# Patient Record
Sex: Female | Born: 1940 | Race: White | Hispanic: No | Marital: Single | Smoking: Never smoker
Health system: Southern US, Community
[De-identification: ages and names within clinical notes are randomized; demographics above are authoritative.]

## PROBLEM LIST (undated history)

## (undated) DIAGNOSIS — I10 Essential (primary) hypertension: Secondary | ICD-10-CM

## (undated) DIAGNOSIS — R519 Headache, unspecified: Secondary | ICD-10-CM

## (undated) DIAGNOSIS — D751 Secondary polycythemia: Secondary | ICD-10-CM

## (undated) DIAGNOSIS — T7840XA Allergy, unspecified, initial encounter: Secondary | ICD-10-CM

## (undated) DIAGNOSIS — I4891 Unspecified atrial fibrillation: Secondary | ICD-10-CM

## (undated) DIAGNOSIS — K219 Gastro-esophageal reflux disease without esophagitis: Secondary | ICD-10-CM

## (undated) DIAGNOSIS — G4730 Sleep apnea, unspecified: Secondary | ICD-10-CM

## (undated) DIAGNOSIS — I422 Other hypertrophic cardiomyopathy: Secondary | ICD-10-CM

## (undated) DIAGNOSIS — I2699 Other pulmonary embolism without acute cor pulmonale: Secondary | ICD-10-CM

## (undated) DIAGNOSIS — R001 Bradycardia, unspecified: Secondary | ICD-10-CM

## (undated) DIAGNOSIS — E119 Type 2 diabetes mellitus without complications: Secondary | ICD-10-CM

## (undated) HISTORY — DX: Unspecified atrial fibrillation: I48.91

## (undated) HISTORY — PX: APPENDECTOMY: SHX54

## (undated) HISTORY — DX: Allergy, unspecified, initial encounter: T78.40XA

## (undated) HISTORY — DX: Secondary polycythemia: D75.1

## (undated) HISTORY — PX: TONSILLECTOMY: SUR1361

## (undated) HISTORY — DX: Other hypertrophic cardiomyopathy: I42.2

## (undated) HISTORY — DX: Bradycardia, unspecified: R00.1

---
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
## 2004-06-28 ENCOUNTER — Ambulatory Visit: Payer: Self-pay | Admitting: Internal Medicine

## 2005-10-27 ENCOUNTER — Ambulatory Visit: Payer: Self-pay | Admitting: Internal Medicine

## 2006-12-05 ENCOUNTER — Ambulatory Visit: Payer: Self-pay | Admitting: Internal Medicine

## 2007-02-20 ENCOUNTER — Ambulatory Visit: Payer: Self-pay | Admitting: Internal Medicine

## 2008-05-07 ENCOUNTER — Ambulatory Visit: Payer: Self-pay | Admitting: Internal Medicine

## 2009-05-19 ENCOUNTER — Ambulatory Visit: Payer: Self-pay | Admitting: Internal Medicine

## 2009-05-26 ENCOUNTER — Ambulatory Visit: Payer: Self-pay | Admitting: Internal Medicine

## 2009-07-15 ENCOUNTER — Ambulatory Visit: Payer: Self-pay | Admitting: Gastroenterology

## 2010-02-28 ENCOUNTER — Ambulatory Visit: Payer: Self-pay | Admitting: Internal Medicine

## 2010-05-24 ENCOUNTER — Ambulatory Visit: Payer: Self-pay | Admitting: Internal Medicine

## 2011-07-26 ENCOUNTER — Ambulatory Visit: Payer: Self-pay | Admitting: Internal Medicine

## 2011-08-15 DIAGNOSIS — I2699 Other pulmonary embolism without acute cor pulmonale: Secondary | ICD-10-CM

## 2011-08-15 HISTORY — DX: Other pulmonary embolism without acute cor pulmonale: I26.99

## 2012-05-02 ENCOUNTER — Ambulatory Visit: Payer: Self-pay | Admitting: Internal Medicine

## 2012-05-04 ENCOUNTER — Inpatient Hospital Stay: Payer: Self-pay

## 2012-05-04 LAB — COMPREHENSIVE METABOLIC PANEL
Alkaline Phosphatase: 81 U/L (ref 50–136)
BUN: 17 mg/dL (ref 7–18)
Calcium, Total: 9.3 mg/dL (ref 8.5–10.1)
Chloride: 103 mmol/L (ref 98–107)
Co2: 24 mmol/L (ref 21–32)
EGFR (African American): 53 — ABNORMAL LOW
EGFR (Non-African Amer.): 45 — ABNORMAL LOW
Potassium: 3.6 mmol/L (ref 3.5–5.1)
SGOT(AST): 35 U/L (ref 15–37)
SGPT (ALT): 49 U/L (ref 12–78)
Total Protein: 7.5 g/dL (ref 6.4–8.2)

## 2012-05-04 LAB — CBC
HCT: 50.6 % — ABNORMAL HIGH (ref 35.0–47.0)
HGB: 16.9 g/dL — ABNORMAL HIGH (ref 12.0–16.0)
MCH: 29.4 pg (ref 26.0–34.0)
MCHC: 33.5 g/dL (ref 32.0–36.0)
Platelet: 183 10*3/uL (ref 150–440)
RBC: 5.76 10*6/uL — ABNORMAL HIGH (ref 3.80–5.20)

## 2012-05-04 LAB — PROTIME-INR: INR: 1

## 2012-05-04 LAB — CK TOTAL AND CKMB (NOT AT ARMC): CK-MB: 3.3 ng/mL (ref 0.5–3.6)

## 2012-05-04 LAB — TROPONIN I: Troponin-I: 0.06 ng/mL — ABNORMAL HIGH

## 2012-05-04 LAB — APTT
Activated PTT: 31.5 secs (ref 23.6–35.9)
Activated PTT: >160 secs (ref 23.6–35.9)

## 2012-05-04 LAB — PRO B NATRIURETIC PEPTIDE: B-Type Natriuretic Peptide: 7182 pg/mL — ABNORMAL HIGH (ref 0–125)

## 2012-05-05 LAB — APTT: Activated PTT: 92.3 secs — ABNORMAL HIGH (ref 23.6–35.9)

## 2012-05-05 LAB — CBC WITH DIFFERENTIAL/PLATELET
Basophil #: 0.1 10*3/uL (ref 0.0–0.1)
Eosinophil #: 0.5 10*3/uL (ref 0.0–0.7)
HCT: 44.5 % (ref 35.0–47.0)
Lymphocyte #: 1.7 10*3/uL (ref 1.0–3.6)
MCHC: 33.6 g/dL (ref 32.0–36.0)
MCV: 88 fL (ref 80–100)
Monocyte #: 0.7 x10 3/mm (ref 0.2–0.9)
Monocyte %: 9.8 %
Neutrophil #: 3.8 10*3/uL (ref 1.4–6.5)
Neutrophil %: 56.2 %
Platelet: 157 10*3/uL (ref 150–440)
RDW: 14 % (ref 11.5–14.5)
WBC: 6.8 10*3/uL (ref 3.6–11.0)

## 2012-05-05 LAB — BASIC METABOLIC PANEL
BUN: 15 mg/dL (ref 7–18)
Creatinine: 1.14 mg/dL (ref 0.60–1.30)
EGFR (African American): 56 — ABNORMAL LOW
EGFR (Non-African Amer.): 49 — ABNORMAL LOW
Glucose: 124 mg/dL — ABNORMAL HIGH (ref 65–99)
Potassium: 3.8 mmol/L (ref 3.5–5.1)

## 2012-05-05 LAB — TROPONIN I: Troponin-I: 0.22 ng/mL — ABNORMAL HIGH

## 2012-05-05 LAB — HEMOGLOBIN A1C: Hemoglobin A1C: 6.8 % — ABNORMAL HIGH (ref 4.2–6.3)

## 2012-05-06 LAB — CBC WITH DIFFERENTIAL/PLATELET
Basophil #: 0.1 10*3/uL (ref 0.0–0.1)
Eosinophil #: 0.6 10*3/uL (ref 0.0–0.7)
HCT: 42.7 % (ref 35.0–47.0)
Lymphocyte %: 27.7 %
MCHC: 33.8 g/dL (ref 32.0–36.0)
Monocyte #: 0.7 x10 3/mm (ref 0.2–0.9)
Monocyte %: 9.9 %
Neutrophil #: 3.9 10*3/uL (ref 1.4–6.5)
Neutrophil %: 53.9 %
Platelet: 147 10*3/uL — ABNORMAL LOW (ref 150–440)
RDW: 14.1 % (ref 11.5–14.5)
WBC: 7.3 10*3/uL (ref 3.6–11.0)

## 2012-05-06 LAB — BASIC METABOLIC PANEL
Anion Gap: 8 (ref 7–16)
Co2: 25 mmol/L (ref 21–32)
EGFR (African American): >60
Glucose: 122 mg/dL — ABNORMAL HIGH (ref 65–99)
Potassium: 4.3 mmol/L (ref 3.5–5.1)
Sodium: 144 mmol/L (ref 136–145)

## 2012-05-06 LAB — APTT: Activated PTT: 99.9 secs — ABNORMAL HIGH (ref 23.6–35.9)

## 2012-05-07 ENCOUNTER — Ambulatory Visit: Payer: Self-pay | Admitting: Internal Medicine

## 2012-05-07 LAB — PROTIME-INR
INR: 1
Prothrombin Time: 13.3 secs (ref 11.5–14.7)

## 2012-05-07 LAB — APTT: Activated PTT: 77.7 secs — ABNORMAL HIGH (ref 23.6–35.9)

## 2012-05-08 LAB — PLATELET COUNT: Platelet: 138 10*3/uL — ABNORMAL LOW (ref 150–440)

## 2012-05-14 ENCOUNTER — Ambulatory Visit: Payer: Self-pay | Admitting: Internal Medicine

## 2012-05-17 ENCOUNTER — Ambulatory Visit: Payer: Self-pay | Admitting: Internal Medicine

## 2012-05-17 LAB — CBC CANCER CENTER
Basophil #: 0.1 x10 3/mm (ref 0.0–0.1)
Basophil %: 0.8 %
Eosinophil #: 0.4 x10 3/mm (ref 0.0–0.7)
HCT: 51.1 % — ABNORMAL HIGH (ref 35.0–47.0)
HGB: 16.1 g/dL — ABNORMAL HIGH (ref 12.0–16.0)
Lymphocyte #: 1.3 x10 3/mm (ref 1.0–3.6)
MCHC: 31.6 g/dL — ABNORMAL LOW (ref 32.0–36.0)
MCV: 89 fL (ref 80–100)
Monocyte %: 9.9 %
Neutrophil #: 4.6 x10 3/mm (ref 1.4–6.5)
RBC: 5.72 10*6/uL — ABNORMAL HIGH (ref 3.80–5.20)
RDW: 14.2 % (ref 11.5–14.5)
WBC: 7.1 x10 3/mm (ref 3.6–11.0)

## 2012-05-17 LAB — PROTIME-INR
INR: 3.7
Prothrombin Time: 36.7 secs — ABNORMAL HIGH (ref 11.5–14.7)

## 2012-05-17 LAB — HEPATIC FUNCTION PANEL A (ARMC)
Alkaline Phosphatase: 88 U/L (ref 50–136)
Bilirubin,Total: 0.4 mg/dL (ref 0.2–1.0)
SGOT(AST): 27 U/L (ref 15–37)
SGPT (ALT): 66 U/L (ref 12–78)

## 2012-05-17 LAB — CREATININE, SERUM: EGFR (Non-African Amer.): 50 — ABNORMAL LOW

## 2012-05-18 LAB — CEA: CEA: 4.9 ng/mL — ABNORMAL HIGH (ref 0.0–4.7)

## 2012-05-20 LAB — PROT IMMUNOELECTROPHORES(ARMC)

## 2012-05-20 LAB — CANCER CENTER HEMATOCRIT: HCT: 45.7 % (ref 35.0–47.0)

## 2012-05-20 LAB — PROTIME-INR: INR: 2.6

## 2012-06-03 LAB — CANCER CENTER HEMATOCRIT: HCT: 42.8 % (ref 35.0–47.0)

## 2012-06-10 LAB — HEMATOCRIT: HCT: 43.1 % (ref 35.0–47.0)

## 2012-06-14 ENCOUNTER — Ambulatory Visit: Payer: Self-pay | Admitting: Internal Medicine

## 2012-06-17 LAB — CBC CANCER CENTER
Basophil %: 0.8 %
Eosinophil #: 0.3 x10 3/mm (ref 0.0–0.7)
Eosinophil %: 5.5 %
HCT: 41.6 % (ref 35.0–47.0)
HGB: 13.4 g/dL (ref 12.0–16.0)
Lymphocyte #: 1.6 x10 3/mm (ref 1.0–3.6)
Lymphocyte %: 30.9 %
MCV: 89 fL (ref 80–100)
Monocyte #: 0.6 x10 3/mm (ref 0.2–0.9)
Neutrophil #: 2.5 x10 3/mm (ref 1.4–6.5)
Neutrophil %: 50.2 %
RDW: 14.3 % (ref 11.5–14.5)
WBC: 5.1 x10 3/mm (ref 3.6–11.0)

## 2012-06-24 LAB — CBC CANCER CENTER
Basophil %: 1 %
Eosinophil %: 5.9 %
HCT: 44.1 % (ref 35.0–47.0)
HGB: 14 g/dL (ref 12.0–16.0)
Lymphocyte #: 1.3 x10 3/mm (ref 1.0–3.6)
Monocyte #: 0.4 x10 3/mm (ref 0.2–0.9)
Monocyte %: 9.9 %
Neutrophil #: 1.7 x10 3/mm (ref 1.4–6.5)
RDW: 14 % (ref 11.5–14.5)
WBC: 3.6 x10 3/mm (ref 3.6–11.0)

## 2012-06-24 LAB — CREATININE, SERUM
EGFR (African American): 58 — ABNORMAL LOW
EGFR (Non-African Amer.): 50 — ABNORMAL LOW

## 2012-07-09 LAB — CANCER CENTER HEMATOCRIT: HCT: 42.5 % (ref 35.0–47.0)

## 2012-07-14 ENCOUNTER — Ambulatory Visit: Payer: Self-pay | Admitting: Internal Medicine

## 2012-07-30 LAB — CANCER CENTER HEMATOCRIT: HCT: 41.4 % (ref 35.0–47.0)

## 2012-08-14 ENCOUNTER — Ambulatory Visit: Payer: Self-pay | Admitting: Internal Medicine

## 2012-08-21 LAB — CEA: CEA: 4.9 ng/mL — ABNORMAL HIGH (ref 0.0–4.7)

## 2012-09-14 ENCOUNTER — Ambulatory Visit: Payer: Self-pay | Admitting: Internal Medicine

## 2012-10-01 LAB — CBC CANCER CENTER
Basophil %: 1 %
Lymphocyte #: 1.8 x10 3/mm (ref 1.0–3.6)
Lymphocyte %: 25.8 %
MCH: 27.6 pg (ref 26.0–34.0)
MCHC: 32.7 g/dL (ref 32.0–36.0)
Monocyte %: 11.6 %
Neutrophil #: 3.9 x10 3/mm (ref 1.4–6.5)
Platelet: 233 x10 3/mm (ref 150–440)
RBC: 5.35 10*6/uL — ABNORMAL HIGH (ref 3.80–5.20)
RDW: 14.2 % (ref 11.5–14.5)

## 2012-10-02 ENCOUNTER — Ambulatory Visit: Payer: Self-pay

## 2012-10-12 ENCOUNTER — Ambulatory Visit: Payer: Self-pay | Admitting: Internal Medicine

## 2012-11-12 ENCOUNTER — Ambulatory Visit: Payer: Self-pay | Admitting: Internal Medicine

## 2012-11-12 LAB — CANCER CENTER HEMATOCRIT: HCT: 44.1 % (ref 35.0–47.0)

## 2012-12-12 ENCOUNTER — Ambulatory Visit: Payer: Self-pay | Admitting: Internal Medicine

## 2013-01-12 ENCOUNTER — Ambulatory Visit: Payer: Self-pay | Admitting: Internal Medicine

## 2013-02-11 ENCOUNTER — Ambulatory Visit: Payer: Self-pay | Admitting: Internal Medicine

## 2013-02-19 ENCOUNTER — Ambulatory Visit: Payer: Self-pay | Admitting: Gastroenterology

## 2013-02-20 LAB — PATHOLOGY REPORT

## 2013-03-04 LAB — CANCER CENTER HEMATOCRIT: HCT: 44.5 % (ref 35.0–47.0)

## 2013-03-14 ENCOUNTER — Ambulatory Visit: Payer: Self-pay | Admitting: Internal Medicine

## 2013-04-28 ENCOUNTER — Ambulatory Visit: Payer: Self-pay | Admitting: Internal Medicine

## 2013-05-14 ENCOUNTER — Ambulatory Visit: Payer: Self-pay | Admitting: Internal Medicine

## 2013-06-24 ENCOUNTER — Ambulatory Visit: Payer: Self-pay | Admitting: Internal Medicine

## 2013-06-24 LAB — CBC CANCER CENTER
Eosinophil %: 5.1 %
HGB: 13.6 g/dL (ref 12.0–16.0)
Lymphocyte #: 1.5 x10 3/mm (ref 1.0–3.6)
Lymphocyte %: 27.5 %
MCV: 85 fL (ref 80–100)
Monocyte #: 0.8 x10 3/mm (ref 0.2–0.9)
Monocyte %: 14.4 %
Neutrophil #: 2.7 x10 3/mm (ref 1.4–6.5)
Neutrophil %: 51.9 %
Platelet: 232 x10 3/mm (ref 150–440)
RDW: 14.1 % (ref 11.5–14.5)
WBC: 5.3 x10 3/mm (ref 3.6–11.0)

## 2013-06-24 LAB — HEPATIC FUNCTION PANEL A (ARMC)
Albumin: 3.2 g/dL — ABNORMAL LOW (ref 3.4–5.0)
Alkaline Phosphatase: 85 U/L (ref 50–136)
Bilirubin, Direct: 0.1 mg/dL (ref 0.00–0.20)
SGOT(AST): 19 U/L (ref 15–37)
SGPT (ALT): 24 U/L (ref 12–78)
Total Protein: 6.7 g/dL (ref 6.4–8.2)

## 2013-06-24 LAB — CREATININE, SERUM
Creatinine: 1.01 mg/dL (ref 0.60–1.30)
EGFR (African American): >60
EGFR (Non-African Amer.): 56 — ABNORMAL LOW

## 2013-07-14 ENCOUNTER — Ambulatory Visit: Payer: Self-pay | Admitting: Internal Medicine

## 2013-08-14 ENCOUNTER — Ambulatory Visit: Payer: Self-pay | Admitting: Internal Medicine

## 2013-09-15 ENCOUNTER — Ambulatory Visit: Payer: Self-pay | Admitting: Internal Medicine

## 2013-09-16 LAB — CANCER CENTER HEMATOCRIT: HCT: 44.6 % (ref 35.0–47.0)

## 2013-09-17 LAB — CEA: CEA: 5.6 ng/mL — AB (ref 0.0–4.7)

## 2013-10-12 ENCOUNTER — Ambulatory Visit: Payer: Self-pay | Admitting: Internal Medicine

## 2013-11-06 ENCOUNTER — Ambulatory Visit: Payer: Self-pay

## 2013-11-12 ENCOUNTER — Ambulatory Visit: Payer: Self-pay

## 2013-12-08 ENCOUNTER — Ambulatory Visit: Payer: Self-pay | Admitting: Internal Medicine

## 2013-12-09 LAB — CANCER CENTER HEMATOCRIT: HCT: 43.4 % (ref 35.0–47.0)

## 2013-12-12 ENCOUNTER — Ambulatory Visit: Payer: Self-pay | Admitting: Internal Medicine

## 2014-03-03 ENCOUNTER — Ambulatory Visit: Payer: Self-pay | Admitting: Internal Medicine

## 2014-03-03 LAB — CBC CANCER CENTER
BASOS ABS: 0.1 x10 3/mm (ref 0.0–0.1)
BASOS PCT: 0.9 %
Eosinophil #: 0.6 x10 3/mm (ref 0.0–0.7)
Eosinophil %: 8.9 %
HCT: 46.4 % (ref 35.0–47.0)
HGB: 14.9 g/dL (ref 12.0–16.0)
Lymphocyte #: 1.4 x10 3/mm (ref 1.0–3.6)
Lymphocyte %: 21.9 %
MCH: 27.8 pg (ref 26.0–34.0)
MCHC: 32.1 g/dL (ref 32.0–36.0)
MCV: 87 fL (ref 80–100)
MONO ABS: 0.6 x10 3/mm (ref 0.2–0.9)
MONOS PCT: 9.1 %
NEUTROS ABS: 3.7 x10 3/mm (ref 1.4–6.5)
NEUTROS PCT: 59.2 %
PLATELETS: 230 x10 3/mm (ref 150–440)
RBC: 5.36 10*6/uL — AB (ref 3.80–5.20)
RDW: 15.3 % — ABNORMAL HIGH (ref 11.5–14.5)
WBC: 6.2 x10 3/mm (ref 3.6–11.0)

## 2014-03-03 LAB — HEPATIC FUNCTION PANEL A (ARMC)
ALBUMIN: 3 g/dL — AB (ref 3.4–5.0)
ALK PHOS: 59 U/L
ALT: 23 U/L (ref 12–78)
BILIRUBIN DIRECT: 0.1 mg/dL (ref 0.00–0.20)
Bilirubin,Total: 0.5 mg/dL (ref 0.2–1.0)
SGOT(AST): 16 U/L (ref 15–37)
Total Protein: 6.5 g/dL (ref 6.4–8.2)

## 2014-03-03 LAB — CREATININE, SERUM
Creatinine: 0.98 mg/dL (ref 0.60–1.30)
EGFR (African American): >60
EGFR (Non-African Amer.): 58 — ABNORMAL LOW

## 2014-03-04 LAB — CEA: CEA: 6.5 ng/mL — AB (ref 0.0–4.7)

## 2014-03-14 ENCOUNTER — Ambulatory Visit: Payer: Self-pay | Admitting: Internal Medicine

## 2014-04-09 ENCOUNTER — Ambulatory Visit: Payer: Self-pay | Admitting: Gastroenterology

## 2014-05-15 ENCOUNTER — Ambulatory Visit: Payer: Self-pay

## 2014-05-26 ENCOUNTER — Ambulatory Visit: Payer: Self-pay | Admitting: Internal Medicine

## 2014-05-26 LAB — CANCER CENTER HEMATOCRIT: HCT: 42.1 % (ref 35.0–47.0)

## 2014-06-14 ENCOUNTER — Ambulatory Visit: Payer: Self-pay | Admitting: Internal Medicine

## 2014-08-18 ENCOUNTER — Ambulatory Visit: Payer: Self-pay | Admitting: Internal Medicine

## 2014-08-18 LAB — CANCER CENTER HEMATOCRIT: HCT: 39.9 % (ref 35.0–47.0)

## 2014-09-14 ENCOUNTER — Ambulatory Visit: Payer: Self-pay | Admitting: Internal Medicine

## 2014-11-10 ENCOUNTER — Ambulatory Visit
Admit: 2014-11-10 | Disposition: A | Discharge status: Home / Self Care | Payer: Self-pay | Attending: Internal Medicine | Admitting: Internal Medicine

## 2014-11-10 LAB — CBC CANCER CENTER
BASOS ABS: 0.1 x10 3/mm (ref 0.0–0.1)
Basophil %: 1.3 %
EOS PCT: 5.4 %
Eosinophil #: 0.3 x10 3/mm (ref 0.0–0.7)
HCT: 41 % (ref 35.0–47.0)
HGB: 13.4 g/dL (ref 12.0–16.0)
LYMPHS ABS: 1.6 x10 3/mm (ref 1.0–3.6)
Lymphocyte %: 31.2 %
MCH: 26.2 pg (ref 26.0–34.0)
MCHC: 32.6 g/dL (ref 32.0–36.0)
MCV: 80 fL (ref 80–100)
Monocyte #: 0.6 x10 3/mm (ref 0.2–0.9)
Monocyte %: 11.8 %
NEUTROS ABS: 2.7 x10 3/mm (ref 1.4–6.5)
Neutrophil %: 50.3 %
Platelet: 223 x10 3/mm (ref 150–440)
RBC: 5.11 10*6/uL (ref 3.80–5.20)
RDW: 15.2 % — ABNORMAL HIGH (ref 11.5–14.5)
WBC: 5.3 x10 3/mm (ref 3.6–11.0)

## 2014-11-10 LAB — HEPATIC FUNCTION PANEL A (ARMC)
Albumin: 3.7 g/dL
Alkaline Phosphatase: 61 U/L
BILIRUBIN TOTAL: 0.5 mg/dL
SGOT(AST): 17 U/L
SGPT (ALT): 16 U/L
TOTAL PROTEIN: 6.6 g/dL

## 2014-11-10 LAB — CREATININE, SERUM
Creatinine: 0.94 mg/dL
EGFR (African American): >60
EGFR (Non-African Amer.): >60

## 2014-11-11 LAB — CEA: CEA: 5.3 ng/mL — ABNORMAL HIGH (ref 0.0–4.7)

## 2014-11-13 ENCOUNTER — Ambulatory Visit
Admit: 2014-11-13 | Disposition: A | Discharge status: Home / Self Care | Payer: Self-pay | Attending: Internal Medicine | Admitting: Internal Medicine

## 2014-11-16 ENCOUNTER — Ambulatory Visit
Admit: 2014-11-16 | Disposition: A | Discharge status: Home / Self Care | Payer: Self-pay | Attending: Infectious Diseases | Admitting: Infectious Diseases

## 2014-12-01 NOTE — H&P (Signed)
PATIENT NAME:  Sarah Sweeney, Sarah Sweeney MR#:  [id number] DATE OF BIRTH:  Nov 20, 1940  DATE OF ADMISSION:  05/04/2012  [facility]  (Used to see Dr. Conchita Parison Chaplin, now assigned to Dr. Sampson GoonFitzgerald)  CHIEF COMPLAINT: Shortness of breath.   HISTORY OF PRESENT ILLNESS: This is a 74 year old female with history of hypertension. She presents to the ER today with shortness of breath, this has been worse since Wednesday, occasionally to that with activity. This morning she developed shortness of breath at rest. No complaints of chest pain. On Wednesday she had an episode where she was mowing the lawn, her legs then felt weak and she ended up passing out. She thinks this was around the time frame of 11:00 a.m. to 11:45 when she made it back into the house. She also felt sick to the stomach at that time and felt like she was going to pass out before she did. She went and saw Dr. Judithann SheenSparks on Thursday and her blood pressure was high and was given a prescription for a blood pressure medication, Micardis. A CT scan of the head was done on 05/02/2012 which was negative. In the Emergency Room, she had a CT scan of the chest which showed extensive bilateral pulmonary emboli and a right upper lobe mass. She was also hypoxic with a pulse oximetry of 89% on room air and hospitalist services were contacted for further evaluation.   PAST MEDICAL HISTORY: Hypertension.   PAST SURGICAL HISTORY:  1. Tonsillectomy.  2. Appendectomy.   ALLERGIES: No known drug allergies.   MEDICATIONS:  1. Micardis 80 mg daily, started on Thursday. 2. Aspirin 81 mg daily.   SOCIAL HISTORY: No smoking. No alcohol. No drug use. She lives alone. Retired Location manageradministrative payroll worker.   FAMILY HISTORY: Mother living at 7192 with hypertension. Father unknown. Sister with no medical issues.   REVIEW OF SYSTEMS: CONSTITUTIONAL: No fever, chills, or sweats. No weight loss. No weight gain. No weakness or fatigue. EYES: She does wear  glasses. EARS, NOSE, MOUTH, AND THROAT: No sore throat. No difficulty swallowing. CARDIOVASCULAR: No chest pain. No palpitations. RESPIRATORY: Positive for shortness of breath. No sputum. No hemoptysis. Positive for cough. Occasional wheeze. GASTROINTESTINAL: Positive for constipation. No nausea. No vomiting. No abdominal pain. No bright red blood per rectum. No melena. GENITOURINARY: No burning on urination or hematuria. MUSCULOSKELETAL: Hand soreness with arthritis. INTEGUMENT: No rashes or eruptions. NEUROLOGIC: Positive for syncope on Wednesday. PSYCHIATRIC: No anxiety or depression. ENDOCRINE: No thyroid problems. HEMATOLOGIC/LYMPHATIC: No anemia. No easy bruising or bleeding.   PHYSICAL EXAMINATION:  VITALS: Currently temperature 97.5, pulse 94, respirations 22, blood pressure 172/91, and pulse oximetry 91% on oxygen and on room air pulse oximetry was 89%.   GENERAL: Slight respiratory distress with shallow breathing and coughing with deep breath.   EYES: Conjunctivae and lids normal. Pupils equal, round, and reactive to light. Extraocular muscles intact. No nystagmus.   EARS, NOSE, MOUTH, AND THROAT: Nasal mucosa no erythema. Throat no erythema. No exudate seen. Tympanic membranes no erythema. Lips and gums no lesions.   NECK: No JVD. No bruits. No lymphadenopathy. No thyromegaly. No thyroid nodules palpated.   RESPIRATORY: Shallow breaths. Slight use of accessory muscles. No rhonchi, rales, or wheeze heard. Poor air entry bilaterally.   CARDIOVASCULAR: S1 and S2 normal. No gallops, rubs, or murmurs heard. Carotid upstroke 2+ bilaterally. No bruits. Dorsalis pedis pulses 2+ bilaterally. No edema of the lower extremities.   ABDOMEN: Soft and nontender. No organomegaly/splenomegaly. Normoactive  bowel sounds. No masses felt.   LYMPHATIC: No lymph nodes in the neck.   MUSCULOSKELETAL: No clubbing, edema, or cyanosis.   SKIN: Chronic lower extremity discoloration, right greater than left.    NEUROLOGIC: Cranial nerves II through XII grossly intact. Deep tendon reflexes 2+ bilateral lower extremities.   PSYCHIATRIC: The patient is oriented to person, place, and time.   LABORATORY AND RADIOLOGIC DATA: BNP 7182. Troponin borderline at 0.06. White blood cell count 7.9, hemoglobin and hematocrit 16.9 and 50.6, and platelet count 183. Glucose 181, BUN 17, creatinine 1.21, sodium 139, potassium 3.6, chloride 103, CO2 24, calcium 9.3, total bilirubin 1.2, alkaline phosphatase 81, ALT 49, AST 35, and GFR 45.   Chest x-ray: Borderline cardiomegaly, no acute cardiopulmonary disease.   CT scan of the chest for pulmonary embolism showed extensive bilateral pulmonary emboli, most prominent in the lower lobes, as well as left upper lobe and lingula. Right upper lobe mass measuring 2.21 cm and 2.7 cm.   ASSESSMENT AND PLAN:  1. Acute respiratory failure with hypoxia. This is secondary to pulmonary embolism. We will give oxygen supplementation.  2. Extensive pulmonary embolism. The case was discussed with Dr. Belia Heman of pulmonary and is to see the patient. We will put the patient on a heparin drip to stabilize the clot. Hold off on further blood thinners at this point. We will get a sonogram of the lower extremities. If this is positive, may be a candidate for IVC filter. We will get an echocardiogram to evaluate for heart strain. Currently hemodynamically stable, but with the extensive clot must watch closely for decompensation.  3. Lung mass. 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 Dr. Belia Heman to review the CT scan to determine when the patient should have further work-up and biopsy.  4. Syncope. This is likely from pulmonary embolism and hypoxia. We will monitor on telemetry.      5. Elevated troponin. This is likely from pulmonary embolism.  6. Impaired fasting glucose. We will monitor here. Check a Hemoglobin A1c.  TIME SPENT ON CRITICALLY ILL PATIENT: 55 minutes.  ____________________________ Herschell Dimes.  Renae Gloss, MD rjw:slb D: 05/04/2012 14:59:19 ET T: 05/04/2012 15:27:30 ET JOB#: [id number]  cc: Herschell Dimes. Renae Gloss, MD, <Dictator> Stann Mainland. Sampson Goon, MD Salley Scarlet MD ELECTRONICALLY SIGNED 05/05/2012 17:37

## 2014-12-01 NOTE — Consult Note (Signed)
PATIENT NAME:  Sarah Sweeney, Sarah Sweeney MR#:  [id number] DATE OF BIRTH:  [birth year]  DATE OF CONSULTATION:  05/06/2012   CONSULTING PHYSICIAN: Jasmine Decemberimothy E Angeline Trick, M.D.   REASON FOR CONSULTATION: Right upper lung mass.   I have personally seen and examined Ms. Sarah Sweeney. I have independently reviewed her films. I have discussed her care with Dr. Clydie Braunavid Fitzgerald.   HISTORY OF PRESENT ILLNESS: Ms. Sarah Sweeney is a 74 year old white female with a history of hypertension, who was just recently seen by Dr. Judithann Sweeney. She presented to the Emergency Room today with increasing shortness of breath. She states that on Wednesday she had an episode where she passed out while mowing the lawn. She felt unwell, went to see Dr. Judithann Sweeney on Thursday, who noticed that she was hypertensive and prescribed her some Micardis. The patient got more and more short of breath and felt unwell and ultimately came to the Emergency Room where a CT scan was performed. The CT scan of her chest revealed an extensive bilateral pulmonary emboli and a right upper lobe consolidative process. I am asked to see her regarding that. She has been admitted to the hospital where she is now been placed on heparin.   The patient states that she does have a history of superficial venous disease and she saw Dr. Wyn Quakerew for that several years ago. She has never been told she had any deep vein thrombosis in the past. She has had no further hemoptysis and is currently inspiring nasal cannula oxygen and feeling well.   Other past medical history significant for tonsillectomy and appendectomy. She takes Micardis, which is just recently started.   FAMILY HISTORY: Mother is alive at age 73 with hypertension. Her father is unknown.   SOCIAL HISTORY: She is a nonsmoker, nondrinker. She lives alone. She is a retired Armed forces technical officeradministrator payroll worker.   REVIEW OF SYSTEMS: As per history of present illness. All other review of systems were asked and were negative.   PHYSICAL  EXAMINATION:   GENERAL: A pleasant, slightly obese female in no acute distress. She is able to speak in complete sentences.   LUNGS: Her lungs revealed no wheezing. They were clear bilaterally.   HEART: Her heart was regular. There were no murmurs.   ABDOMEN: Her abdomen was soft, nontender, nondistended. There were no palpable masses. There was no hepatosplenomegaly.   EXTREMITIES:  She did have some superficial varicosities in the right lower extremity but had good pulses throughout. There was no tenderness to palpation on either calf or thigh. She did have evidence of chronic skin changes on the right lower extremity.   ASSESSMENT AND PLAN: I have independently reviewed the patient's CT scan. There is a right upper lobe mass. She is scheduled to undergo a PET scan today. Once the results of the PET scan are available, we can then make further recommendations regarding the need for biopsy or surgery. My initial thought is in looking at CT scan that this is unlikely to represent a malignancy.   Thank you very much for allowing me to participate in her care.     ____________________________ Sheppard Plumberimothy E. Thelma Bargeaks, MD teo:ljs D: 05/06/2012 10:07:36 ET T: 05/06/2012 11:34:53 ET JOB#: [id number]  cc: Marcial Pacasimothy E. Thelma Bargeaks, MD, <Dictator> Jasmine DecemberIMOTHY E Calyn Rubi MD ELECTRONICALLY SIGNED 05/08/2012 10:18

## 2014-12-01 NOTE — Consult Note (Signed)
PATIENT NAME:  Sarah Sweeney, Sarah Sweeney MR#:  [id number] DATE OF BIRTH:  [birth year]  DATE OF CONSULTATION:  05/07/2012  REFERRING PHYSICIAN:  Dr. Sampson GoonFitzgerald CONSULTING PHYSICIAN:  Leoni Goodness R. Sherrlyn HockPandit, MD  REASON FOR CONSULTATION: Lung mass, pulmonary embolism, PET scan positive.   HISTORY OF PRESENT ILLNESS: The patient is a 74 year old Caucasian female who was admitted to the hospital on 05/04/2012 with complaints of progressive shortness of breath. The patient states that she had symptoms of shortness of breath which had been progressive for about 3 to 4 days prior to admission. She also had an episode last Wednesday when she was mowing the lawn and legs felt weak and passed out for a short time. She has had mild nausea. Otherwise, denies any chest pain, progressive cough, or hemoptysis. No fevers. CT scan of the chest showed extensive bilateral pulmonary emboli and she is on anticoagulation with IV heparin. Lower extremity Doppler bilaterally was negative for deep vein thrombosis, there was nonocclusive thrombus in the left greater saphenous vein. CT scan of the chest showed a right upper lung abnormality. She, therefore, had a PET scan done on 09/23 which showed increased uptake in the soft tissue density mass in the right upper lobe raising possibility of malignancy, maximal SUV of 4.1. In the There was a small amount of increased uptake associated with the posterior wall of the stomach or the extreme superficial aspect of the left hepatic lobe with maximal SUV of 6.1. No other mediastinal lymphadenopathy or metastatic disease reported. The patient denies any pain issues at this time. She is still mildly dyspneic on exertion but states that she is getting better. She has no known history of malignancy in the past. She denies any recent long road trips or plane rides. She does not take any estrogen-containing medications and is a nonsmoker. No recent illness, states that she has been physically active as usual.    PAST MEDICAL HISTORY/PAST SURGICAL HISTORY:  1. Hypertension.  2. Tonsillectomy.  3. Appendectomy.   FAMILY HISTORY: Denies malignancy or hematological disorders, including thromboembolic phenomena. Remarkable for hypertension in her mother.   ALLERGIES: No known drug allergies.   SOCIAL HISTORY: No history of smoking or alcohol usage. The patient lives alone. She is a retired Scientist, product/process developmentadministrative apparel worker.   HOME MEDICATIONS:  1. Aspirin 81 mg daily.  2. Micardis 80 mg daily, started last Thursday.   REVIEW OF SYSTEMS: CONSTITUTIONAL: As in history of present illness. No fevers, chills, or night sweats. HEENT: Denies any headaches, epistaxis, ear or jaw pain. No new sinus symptoms. CARDIAC: No angina, palpitation, orthopnea, or paroxysmal nocturnal dyspnea. LUNGS: As in history of present illness. No wheezing. GASTROINTESTINAL: Denies nausea, vomiting, or diarrhea currently. No bright red blood in stools or melena. GENITOURINARY: No dysuria or hematuria. SKIN: No new rashes or pruritus. HEMATOLOGIC: No obvious bleeding symptoms. EXTREMITIES: No new pain or swelling. MUSCULOSKELETAL: Has some arthritis in her hands. Otherwise, no new bone pains. NEUROLOGIC: Recent syncope as described above. Otherwise, denies focal weakness, seizures, headaches or paresthesias in extremities. ENDOCRINE: No polyuria or polydipsia. Appetite is steady. Denies unintentional weight loss.   PHYSICAL EXAMINATION:  GENERAL: The patient is a moderately built and well nourished individual, sitting in recliner chair, alert and oriented to self, place, person, and time and converses appropriately. No acute distress at rest. No icterus. No pallor.   VITAL SIGNS: Temperature 98.5, pulse 52, respiratory rate 18, blood pressure 140/75, sats 93% on room air.   HEENT: Normocephalic, atraumatic. Extraocular movements  intact. Sclera anicteric. No oral thrush.   NECK: Supple without lymphadenopathy.   CARDIOVASCULAR: S1,  S2, regular rate and rhythm.   LUNGS: Lungs show bilateral good air entry, no crepitations or rhonchi.   ABDOMEN: Soft, nontender, obese. No hepatosplenomegaly clinically.   EXTREMITIES: No major edema or cyanosis.   SKIN: No generalized rashes or major bruising.   LYMPHATICS: No adenopathy palpable in the axillary or inguinal areas.   MUSCULOSKELETAL: No obvious joint deformity or swelling.   NEUROLOGIC: The patient is alert and oriented, nonfocal, cranial nerves are intact.   LABORATORY, DIAGNOSTIC, AND RADIOLOGICAL DATA: PTT 77.7. INR 1.0. CBC on 09/23 shows hemoglobin 14.4, platelets 147, WBC 7,300, ANC 3,900, creatinine 1.07, potassium 4.3, calcium 8.3.   IMPRESSION AND RECOMMENDATION: 74 year old female patient who apparently has been feeling her usual state of health admitted with 3 to 4 days duration of progressive shortness of breath, episode of syncope and found to have extensive bilateral pulmonary emboli along with nonocclusive thrombus in the left greater saphenous vein only, clinically improving on anticoagulation. CT scan also showed a right lung abnormality and PET scan was done which shows increased uptake in the soft tissue density mass in the right upper lobe with SUV of 4.1, along with a second area of abnormal uptake in the upper abdomen around the region of the posterior wall of stomach or subcapsular portion of the left lobe of the liver with SUV of 6.1. Findings raise possibility of lung cancer, however given extensive PE, unclear if lung abnormality could be related to this also. At this time agree with anticoagulation given symptomatic pulmonary embolism, and have discussed PET scan findings with the patient and reviewed films with her today, and recommended that we closely follow up on the right lung abnormality with repeat CT scan in about 4 to 6 weeks. Also, will discuss her scans in Tumor Board on Thursday with regards to both the lung abnormality and the area of  abnormal uptake in the upper abdomen since it is unclear as to what this could be. The patient has unprovoked episode of pulmonary embolism and would also recommend age-appropriate cancer screening, will need to look into if she has had recent screening colonoscopy. Will follow-up after case discussion in Tumor Board, if she is discharged by then, will follow up as an outpatient. The patient explained above, agreeable to this plan.   Thank you for the referral. Please feel free to contact me if any additional questions.  ____________________________ Maren Reamer Sherrlyn Hock, MD srp:ap D: 05/08/2012 09:01:27 ET           T: 05/08/2012 10:04:56 ET               JOB#: [id number] cc: Danely Bayliss R. Sherrlyn Hock, MD, <Dictator> Wille Celeste MD ELECTRONICALLY SIGNED 05/08/2012 10:50

## 2014-12-01 NOTE — Discharge Summary (Signed)
PATIENT NAME:  Sarah Sweeney, Sarah Sweeney MR#:  [id number] DATE OF BIRTH:  26-Jul-1941  DATE OF ADMISSION:  05/04/2012 DATE OF DISCHARGE:  05/08/2012  PRIMARY CARE PHYSICIAN: Dr. Clydie Braunavid Laelyn Blumenthal   CONSULTING CARDIOLOGIST: Dr. Marcina MillardAlexander Paraschos   CONSULTING PULMONOLOGIST: Dr. Erin FullingKurian Kasa   CONSULTING ONCOLOGIST: Dr. Janese BanksSandeep Pandit   CONSULTING CT SURGEON: Dr. Hulda Marinimothy Oaks   DISCHARGE DIAGNOSES:  1. Bilateral pulmonary emboli.  2. Lung mass.  3. Elevated troponin, likely supply demand ischemia.   HISTORY OF PRESENT ILLNESS:  The patient is a very pleasant 74 year old female who was admitted through the Emergency Room on 09/21 with complaints of shortness of breath. She had also had a syncopal episode several days prior. Please see admission History and Physical by Dr. Renae GlossWieting.    HOSPITAL COURSE BY ISSUE:  1. Acute hypoxic respiratory failure due to pulmonary embolism: She was given oxygen and treated for the pulmonary embolism and stabilized.  2. Extensive pulmonary embolism: She was seen by Dr. Belia HemanKasa. She was started on a heparin drip. She was transitioned to Xarelto, and after she had stabilized she was discharged home with followup.  3. Lung mass: This was noted on her CT, but it was unclear if it was a malignancy or not. She did have a PET scan which did show activity at the site. She was seen by Dr. Thelma Bargeaks in CT Surgery and Dr. Sherrlyn HockPandit in Oncology. Plan is to follow up as an outpatient with repeat CT scan to assess changes in this lesion.  4. Elevated troponins: The patient was seen by Dr. Darrold JunkerParaschos. He felt this was likely supply demand ischemia.  DISCHARGE MEDICATIONS:  1. Lovenox 80 mg subcutaneous every 12 hours for 5 days.  2. Warfarin 5 mg once a day.  3. Omeprazole 40 mg once a day.  4. Micardis 80 mg tablets once a day.   DISCHARGE FOLLOWUP: The patient will follow up with Dr. Sampson GoonFitzgerald and Dr. Sherrlyn HockPandit.   DISCHARGE DIET: Regular.   DISCHARGE ACTIVITY: As tolerated.   TIME SPENT:   This discharge took greater than 40 minutes.   ____________________________ Stann Mainlandavid P. Sampson GoonFitzgerald, MD dpf:cbb D: 05/21/2012 17:08:23 ET T: 05/22/2012 10:30:40 ET JOB#: [id number]  cc: Stann Mainlandavid P. Sampson GoonFitzgerald, MD, <Dictator> Dyllen Menning Sampson GoonFITZGERALD MD ELECTRONICALLY SIGNED 05/24/2012 12:16

## 2014-12-01 NOTE — Consult Note (Signed)
PATIENT NAME:  Sarah Sweeney, Sarah Sweeney MR#:  [id number] DATE OF BIRTH:  [birth year]  DATE OF CONSULTATION:  05/05/2012  REFERRING PHYSICIAN:   CONSULTING PHYSICIAN:  Marcina MillardAlexander Alveta Quintela, MD  PRIMARY CARE PHYSICIAN: Clydie Braunavid Fitzgerald, MD  CHIEF COMPLAINT: Shortness of breath.   REASON FOR CONSULTATION: Consultation is requested for evaluation of elevated troponin.   HISTORY OF PRESENT ILLNESS: The patient is a 74 year old female who is admitted with chief complaint of shortness of breath and found to have pulmonary emboli. The patient reports that she was in her usual state of health until 05/01/2012 when she was mowing the lawn and noted her legs were weak and she was short of breath. The patient saw Dr. Judithann SheenSparks as an outpatient and was noted to have elevated blood pressure. CT scan of the head was performed 05/02/2012 which was negative. The patient presented to the Emergency Room where CT scan of the chest revealed extensive bilateral pulmonary emboli with a right upper lobe mass. The patient was admitted to telemetry where she was noted to have a borderline elevated troponin of 0.22 in the absence of chest pain or EKG changes.   PAST MEDICAL HISTORY: Hypertension.   MEDICATIONS:  1. Aspirin 81 mg daily.  2. Micardis 80 mg daily.   SOCIAL HISTORY: The patient currently lives alone. She denies tobacco abuse.   FAMILY HISTORY: No immediate family history for coronary artery disease or myocardial infarction.   REVIEW OF SYSTEMS: CONSTITUTIONAL: No fever or chills. EYES: No blurry vision. EARS: No hearing loss. RESPIRATORY: Shortness of breath as described above. CARDIOVASCULAR: No chest pain. GI: The patient has had some constipation. Denies nausea, vomiting, or diarrhea. GU: No dysuria or hematuria. MUSCULOSKELETAL: No arthralgias or myalgias. NEUROLOGICAL: No focal muscle weakness or numbness. PSYCHOLOGICAL: No depression or anxiety.   PHYSICAL EXAMINATION:   VITAL SIGNS: Blood pressure 150/91,  pulse 65, respirations 16, temperature 98.9, and pulse oximetry 95%.   HEENT: Pupils are equal and reactive to light and accommodation.   NECK: Supple without thyromegaly.   LUNGS: Clear.   HEART: Normal jugular venous pressure. Normal point of maximal impulse. Regular rate and rhythm. Normal S1 and S2. No appreciable gallop, murmur, or rub.   ABDOMEN: Soft and nontender. Pulses were intact bilaterally.   MUSCULOSKELETAL: Normal muscle tone.   NEUROLOGIC: The patient is alert and oriented x3. Motor and sensory are both grossly intact.   IMPRESSION: This is a 74 year old female with new onset shortness of breath found to have extensive bilateral pulmonary emboli. The patient has borderline elevated troponin in the absence of chest pain or EKG changes. This is likely demand/supply ischemia and unlikely due to non-ST-elevation myocardial infarction.   RECOMMENDATIONS:  1. Agree with current therapy.  2. Continue anticoagulation for bilateral pulmonary emboli.          3. Review 2-D echocardiogram.  4. No further cardiac diagnostics required at this time.  ____________________________ Marcina MillardAlexander Genevia Bouldin, MD ap:slb D: 05/05/2012 10:38:49 ET T: 05/05/2012 13:50:13 ET JOB#: [id number]  cc: Marcina MillardAlexander Lacrystal Barbe, MD, <Dictator> Marcina MillardALEXANDER Zayvien Canning MD ELECTRONICALLY SIGNED 05/31/2012 15:06

## 2014-12-01 NOTE — Consult Note (Signed)
Brief Consult Note: Diagnosis: Borderline elevated trop, probable demand/supply ischemia secondary to PE.   Patient was seen by consultant.   Consult note dictated.   Comments: REC  Agree with current therapy, review echo, no further cardiac diagnostics at this time.  Electronic Signatures: Marcina MillardParaschos, Kohana Amble (MD)  (Signed 22-Sep-13 10:40)  Authored: Brief Consult Note   Last Updated: 22-Sep-13 10:40 by Marcina MillardParaschos, Ezri Fanguy (MD)

## 2015-03-02 ENCOUNTER — Other Ambulatory Visit: Payer: Self-pay | Admitting: *Deleted

## 2015-03-02 ENCOUNTER — Inpatient Hospital Stay: Payer: Medicare Other | Attending: Internal Medicine

## 2015-03-02 ENCOUNTER — Inpatient Hospital Stay: Payer: Medicare Other

## 2015-03-02 ENCOUNTER — Encounter: Payer: Self-pay | Admitting: *Deleted

## 2015-03-02 DIAGNOSIS — I2699 Other pulmonary embolism without acute cor pulmonale: Secondary | ICD-10-CM | POA: Diagnosis present

## 2015-03-02 DIAGNOSIS — D751 Secondary polycythemia: Secondary | ICD-10-CM

## 2015-03-02 DIAGNOSIS — R948 Abnormal results of function studies of other organs and systems: Secondary | ICD-10-CM

## 2015-03-02 DIAGNOSIS — R918 Other nonspecific abnormal finding of lung field: Secondary | ICD-10-CM | POA: Insufficient documentation

## 2015-03-02 HISTORY — DX: Secondary polycythemia: D75.1

## 2015-03-02 LAB — HEMATOCRIT: HEMATOCRIT: 41.6 % (ref 35.0–47.0)

## 2015-03-03 LAB — CEA: CEA: 5.8 ng/mL — ABNORMAL HIGH (ref 0.0–4.7)

## 2015-04-27 IMAGING — CT CT CHEST W/ CM
1 series · 15 of 34 positions shown, 19 images · IV contrast (isovue)
Comparison: 05/04/2012

REASON FOR EXAM: LABS 1st  lung nodule  elevated CEA  smoker
COMMENTS:

PROCEDURE:     KCT - KCT CHEST WITH CONTRAST  - January 14, 2013  [DATE]
RESULT:     Indication: Lung nodule, elevated CEA
TECHNIQUE: Multiple axial images of the chest are obtained with 70 mL of
Isovue 300 intravenous contrast.

[Series 2: chest w/ 3.0 i31f 2 · axial · 0.67mm/px · z∈[-692,-418]mm · 15 of 107 slices shown, 19 images]
[im 8/107  mediastinal]
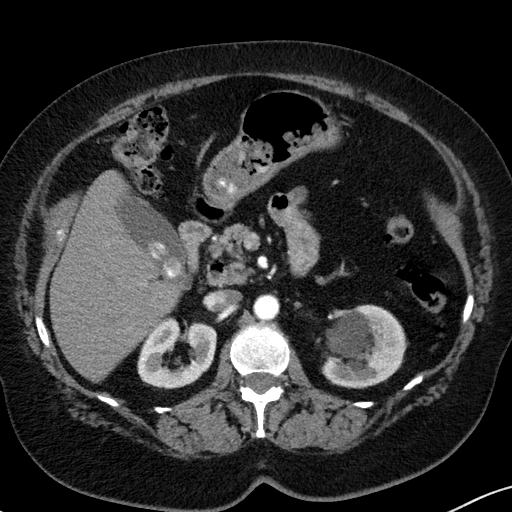
[im 8/107  lung]
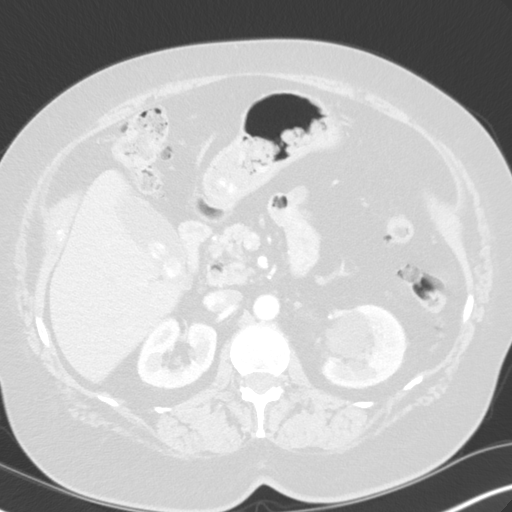
[im 16/107  lung]
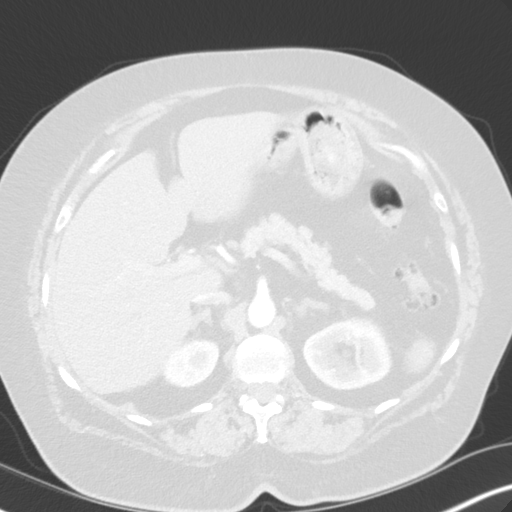
[im 22/107  lung]
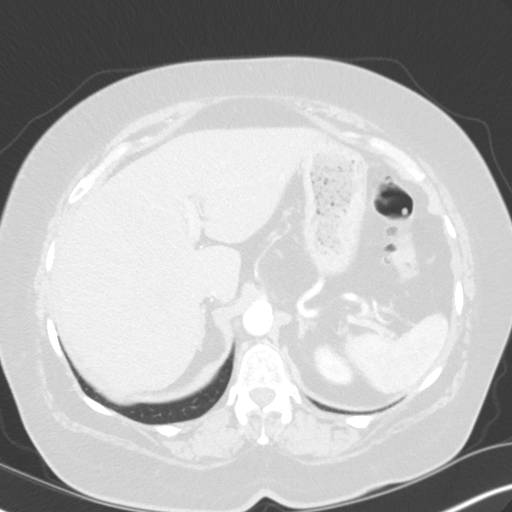
[im 28/107  lung]
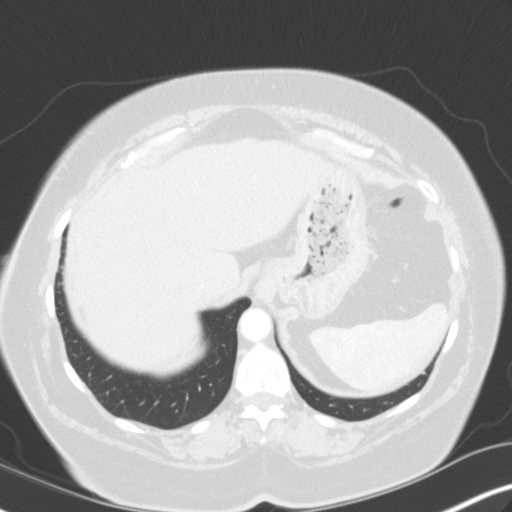
[im 36/107  mediastinal]
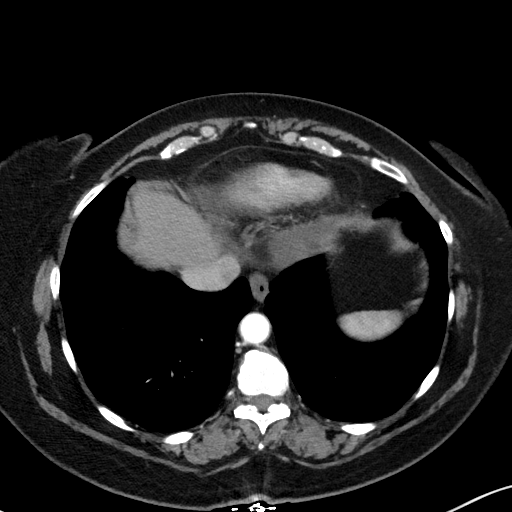
[im 36/107  lung]
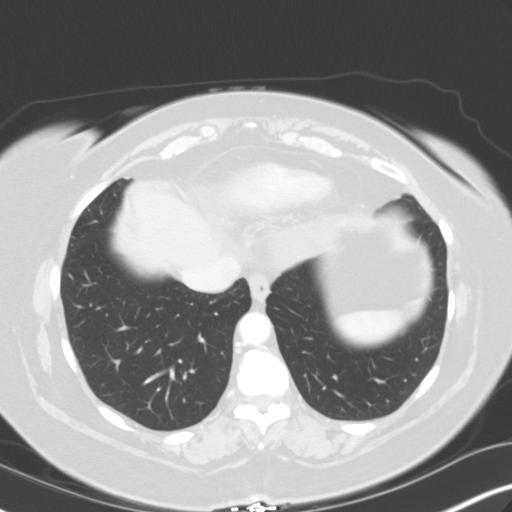
[im 43/107  lung]
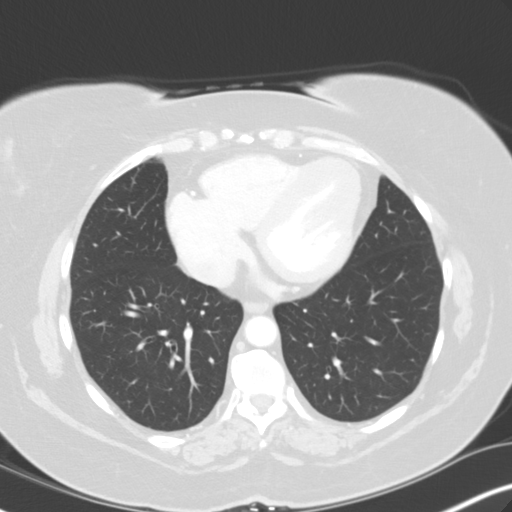
[im 48/107  lung]
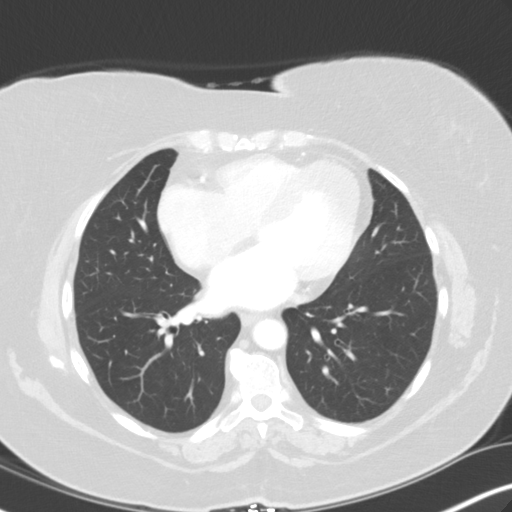
[im 55/107  lung]
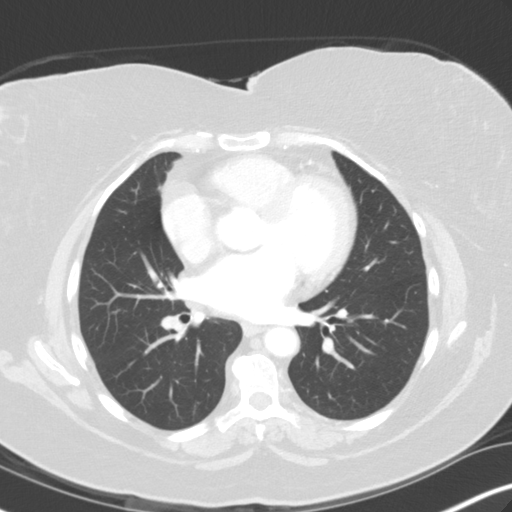
[im 59/107  mediastinal]
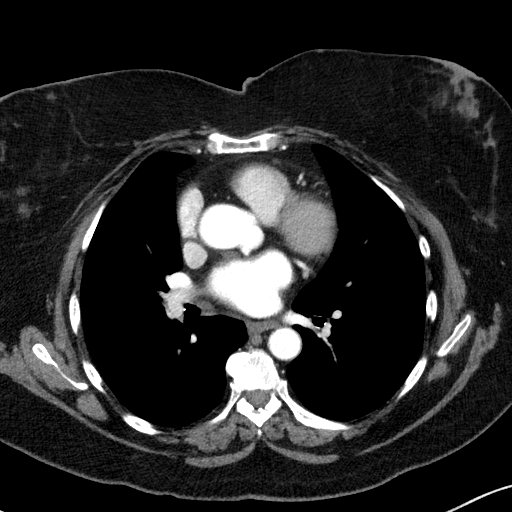
[im 59/107  lung]
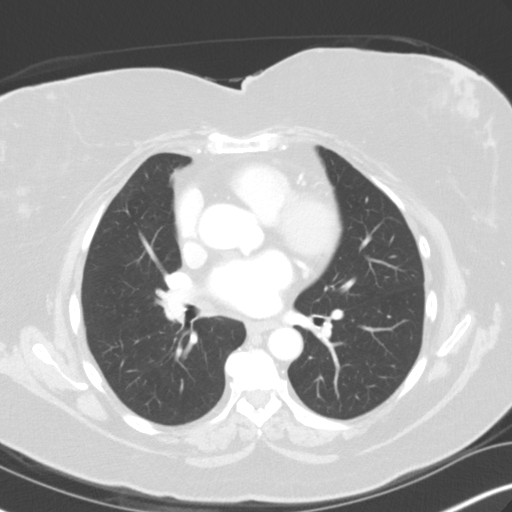
[im 64/107  lung]
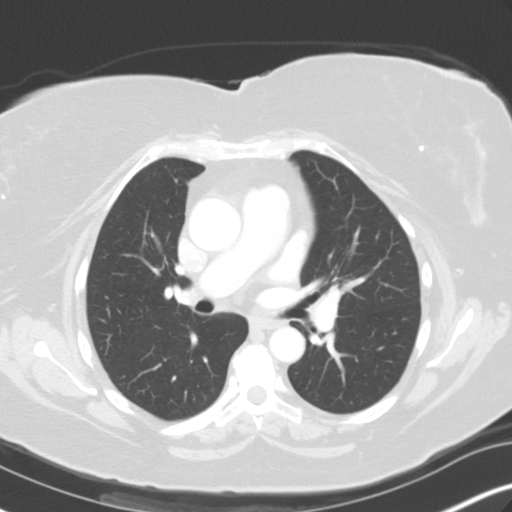
[im 71/107  lung]
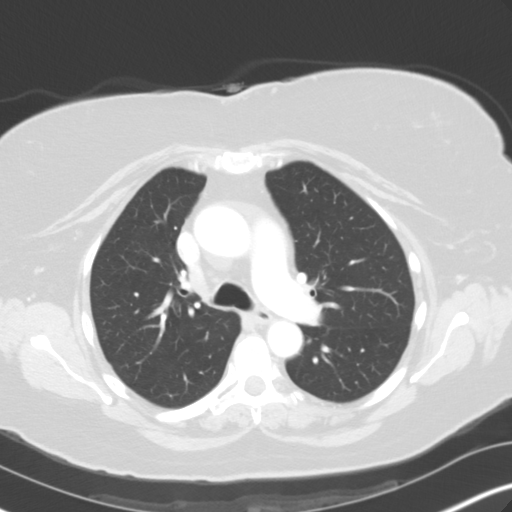
[im 79/107  lung]
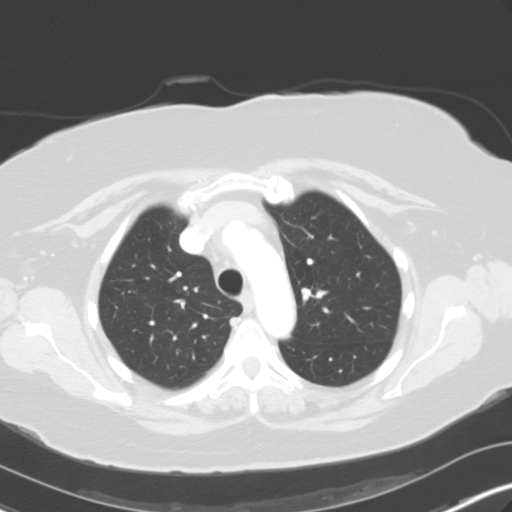
[im 85/107  mediastinal]
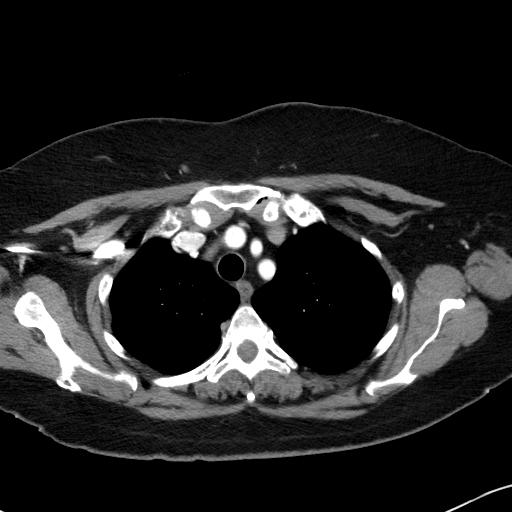
[im 85/107  lung]
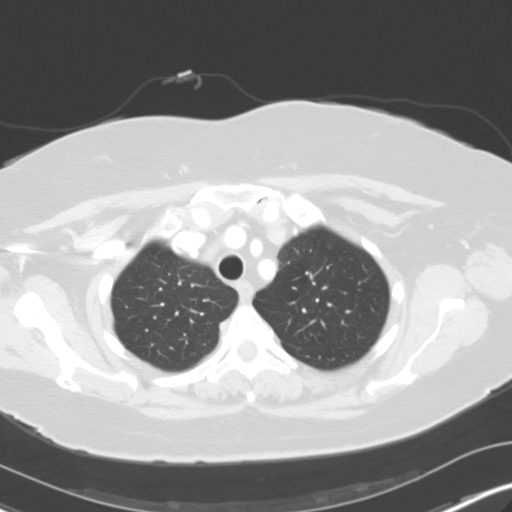
[im 91/107  lung]
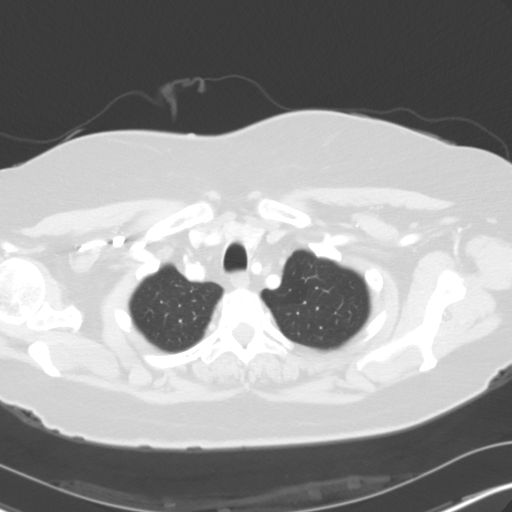
[im 99/107  lung]
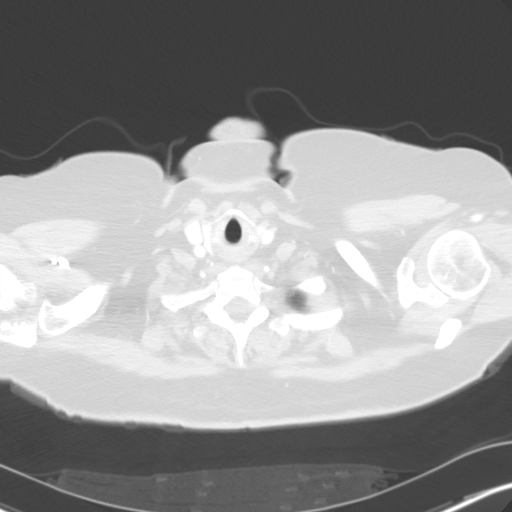

[15 of 34 positions shown; findings below may reference images not displayed]

FINDINGS: The central airways are patent. There is a stable 5 mm subpleural left lower
lobe pulmonary nodule. There is no focal parenchymal opacity. There is no
pleural effusion or pneumothorax.

There are no pathologically enlarged axillary, hilar, or mediastinal lymph
nodes.

The heart size is normal. There is no pericardial effusion. The thoracic
aorta is normal in caliber.

Review of bone windows demonstrates no focal lytic or sclerotic lesions.

Limited noncontrast images of the upper abdomen were obtained. The adrenal
glands appear normal. There are cholelithiasis. There are left parapelvic
cysts.The remainder of the visualized abdominal organs are unremarkable.
IMPRESSION: 1. Stable 5 mm left lower lobe pulmonary nodule. Recommend followup CT chest
in 6 months.

[REDACTED]

## 2015-06-22 ENCOUNTER — Other Ambulatory Visit: Payer: Self-pay

## 2015-06-25 ENCOUNTER — Inpatient Hospital Stay: Payer: Medicare Other

## 2015-06-25 ENCOUNTER — Inpatient Hospital Stay: Payer: Medicare Other | Attending: Family Medicine

## 2015-06-25 DIAGNOSIS — D751 Secondary polycythemia: Secondary | ICD-10-CM | POA: Insufficient documentation

## 2015-06-25 LAB — HEMATOCRIT: HCT: 41.3 % (ref 35.0–47.0)

## 2015-06-25 NOTE — Progress Notes (Signed)
Patient's HCT 41.3  Per Dr.'s orders hold phlebotomy draw if HCT <43.

## 2015-10-11 ENCOUNTER — Other Ambulatory Visit: Payer: Self-pay | Admitting: *Deleted

## 2015-10-11 DIAGNOSIS — R948 Abnormal results of function studies of other organs and systems: Secondary | ICD-10-CM

## 2015-10-11 DIAGNOSIS — D751 Secondary polycythemia: Secondary | ICD-10-CM

## 2015-10-12 ENCOUNTER — Inpatient Hospital Stay: Payer: Medicare Other

## 2015-10-12 ENCOUNTER — Ambulatory Visit: Payer: Self-pay | Admitting: Internal Medicine

## 2015-10-12 ENCOUNTER — Inpatient Hospital Stay: Payer: Medicare Other | Attending: Family Medicine | Admitting: Family Medicine

## 2015-10-12 ENCOUNTER — Encounter: Payer: Self-pay | Admitting: Family Medicine

## 2015-10-12 VITALS — BP 153/83 | HR 48 | Temp 97.1°F | Resp 18 | Ht 65.0 in | Wt 172.2 lb

## 2015-10-12 VITALS — BP 139/79 | HR 48 | Resp 18

## 2015-10-12 DIAGNOSIS — R970 Elevated carcinoembryonic antigen [CEA]: Secondary | ICD-10-CM | POA: Insufficient documentation

## 2015-10-12 DIAGNOSIS — Z7982 Long term (current) use of aspirin: Secondary | ICD-10-CM | POA: Diagnosis not present

## 2015-10-12 DIAGNOSIS — Z809 Family history of malignant neoplasm, unspecified: Secondary | ICD-10-CM | POA: Insufficient documentation

## 2015-10-12 DIAGNOSIS — Z86711 Personal history of pulmonary embolism: Secondary | ICD-10-CM | POA: Insufficient documentation

## 2015-10-12 DIAGNOSIS — D751 Secondary polycythemia: Secondary | ICD-10-CM

## 2015-10-12 DIAGNOSIS — J329 Chronic sinusitis, unspecified: Secondary | ICD-10-CM | POA: Insufficient documentation

## 2015-10-12 DIAGNOSIS — Z79899 Other long term (current) drug therapy: Secondary | ICD-10-CM | POA: Diagnosis not present

## 2015-10-12 DIAGNOSIS — R05 Cough: Secondary | ICD-10-CM | POA: Insufficient documentation

## 2015-10-12 DIAGNOSIS — R911 Solitary pulmonary nodule: Secondary | ICD-10-CM

## 2015-10-12 DIAGNOSIS — R918 Other nonspecific abnormal finding of lung field: Secondary | ICD-10-CM | POA: Insufficient documentation

## 2015-10-12 DIAGNOSIS — Z7901 Long term (current) use of anticoagulants: Secondary | ICD-10-CM | POA: Insufficient documentation

## 2015-10-12 DIAGNOSIS — R059 Cough, unspecified: Secondary | ICD-10-CM

## 2015-10-12 DIAGNOSIS — R948 Abnormal results of function studies of other organs and systems: Secondary | ICD-10-CM

## 2015-10-12 LAB — CBC WITH DIFFERENTIAL/PLATELET
BASOS ABS: 0.1 10*3/uL (ref 0–0.1)
Basophils Relative: 1 %
EOS ABS: 0.3 10*3/uL (ref 0–0.7)
Eosinophils Relative: 5 %
HCT: 43.6 % (ref 35.0–47.0)
Hemoglobin: 14.2 g/dL (ref 12.0–16.0)
LYMPHS ABS: 1.9 10*3/uL (ref 1.0–3.6)
LYMPHS PCT: 29 %
MCH: 26.8 pg (ref 26.0–34.0)
MCHC: 32.7 g/dL (ref 32.0–36.0)
MCV: 82 fL (ref 80.0–100.0)
Monocytes Absolute: 0.8 10*3/uL (ref 0.2–0.9)
Monocytes Relative: 12 %
Neutro Abs: 3.5 10*3/uL (ref 1.4–6.5)
Neutrophils Relative %: 53 %
Platelets: 272 10*3/uL (ref 150–440)
RBC: 5.32 MIL/uL — AB (ref 3.80–5.20)
RDW: 15.1 % — AB (ref 11.5–14.5)
WBC: 6.5 10*3/uL (ref 3.6–11.0)

## 2015-10-12 LAB — COMPREHENSIVE METABOLIC PANEL
ALT: 19 U/L (ref 14–54)
ANION GAP: 6 (ref 5–15)
AST: 19 U/L (ref 15–41)
Albumin: 3.9 g/dL (ref 3.5–5.0)
Alkaline Phosphatase: 67 U/L (ref 38–126)
BUN: 24 mg/dL — ABNORMAL HIGH (ref 6–20)
CHLORIDE: 96 mmol/L — AB (ref 101–111)
CO2: 31 mmol/L (ref 22–32)
Calcium: 9.1 mg/dL (ref 8.9–10.3)
Creatinine, Ser: 1.13 mg/dL — ABNORMAL HIGH (ref 0.44–1.00)
GFR calc Af Amer: 54 mL/min — ABNORMAL LOW (ref >60)
GFR calc non Af Amer: 47 mL/min — ABNORMAL LOW (ref >60)
Glucose, Bld: 141 mg/dL — ABNORMAL HIGH (ref 65–99)
Potassium: 3.7 mmol/L (ref 3.5–5.1)
Sodium: 133 mmol/L — ABNORMAL LOW (ref 135–145)
Total Bilirubin: 0.6 mg/dL (ref 0.3–1.2)
Total Protein: 7.7 g/dL (ref 6.5–8.1)

## 2015-10-12 NOTE — Progress Notes (Addendum)
Sarah Sweeney  [phone]  [phone]     Sarah RUPPERT DOB: 1941/01/24  MR#: [id number]  WEX#:[id number]  Patient Care Team: Adrian Prows, MD as PCP - General (Infectious Diseases)  CHIEF COMPLAINT:  Chief Complaint  Patient presents with  . 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 Suggested in 2014 that no follow-up due to low risk for lung cancer.  INTERVAL HISTORY:  Patient is here for ongoing evaluation and treatment consideration regarding erythrocytosis. Patient was last seen in clinic in March 2016 by Dr. Ma Hillock. Patient has a history of PE from September 2013, she denies any episodes of recurrent thromboembolic event since last evaluation here. Last CT scan was performed in December 2014, at that time nodule in the left base was unchanged from previous exam and that she was low risk for lung cancer and there was no follow-up necessary. She however today is having persistent cough related to recent sinus infection greater than 3 weeks. She denies any hemoptysis. She also has some mild shortness of breath but no complaints of chest pain, no expectoration, or fever and chills.  REVIEW OF SYSTEMS:   Review of Systems  Constitutional: Negative for fever, chills, weight loss, malaise/fatigue and diaphoresis.  HENT: Positive for congestion.   Eyes: Negative.   Respiratory: Positive for cough and shortness of breath. Negative for hemoptysis, sputum production and wheezing.   Cardiovascular: Negative for chest pain, palpitations, orthopnea, claudication, leg swelling and PND.  Gastrointestinal: Negative for heartburn, nausea, vomiting, abdominal pain, diarrhea, constipation, blood in stool and melena.  Genitourinary: Negative.     Musculoskeletal: Negative.   Skin: Negative.   Neurological: Negative for dizziness, tingling, focal weakness, seizures and weakness.  Endo/Heme/Allergies: Does not bruise/bleed easily.  Psychiatric/Behavioral: Negative for depression. The patient is not nervous/anxious and does not have insomnia.     As per HPI. Otherwise, a complete review of systems is negatve.   PAST MEDICAL HISTORY: Past Medical History  Diagnosis Date  . Erythrocytosis 03/02/2015  . Allergy     PAST SURGICAL HISTORY: Past Surgical History  Procedure Laterality Date  . Appendectomy      FAMILY HISTORY Family History  Problem Relation Age of Onset  . Cancer Maternal Aunt   . Cancer Maternal Uncle   . Stroke Maternal Grandfather     GYNECOLOGIC HISTORY:  No LMP recorded.     ADVANCED DIRECTIVES:    HEALTH MAINTENANCE: Social History  Substance Use Topics  . Smoking status: Never Smoker   . Smokeless tobacco: None  . Alcohol Use: No     Colonoscopy: August 2015  PAP:  Bone density:  Mammogram: April 2016  No Known Allergies  Current Outpatient Prescriptions  Medication Sig Dispense Refill  . aspirin EC 81 MG tablet Take by mouth.    . hydrochlorothiazide (HYDRODIURIL) 25 MG tablet     . latanoprost (XALATAN) 0.005 % ophthalmic solution Apply to eye.    Marland Kitchen omeprazole (PRILOSEC) 40 MG capsule     . warfarin (COUMADIN) 1 MG tablet Take by mouth.     No current facility-administered medications for this visit.    OBJECTIVE: BP 153/83 mmHg  Pulse 48  Temp(Src) 97.1 F (36.2 C) (Tympanic)  Resp 18  Ht '5\' 5"'  (1.651 m)  Wt 172 lb 2.9 oz (78.1  kg)  BMI 28.65 kg/m2   Body mass index is 28.65 kg/(m^2).    ECOG FS:1 - Symptomatic but completely ambulatory  General: Well-developed, well-nourished, no acute distress. Eyes: Pink conjunctiva, anicteric sclera. HEENT: Normocephalic, moist mucous membranes, clear oropharnyx. Lungs: Slightly diminished at bases bilaterally, otherwise clear  to auscultation bilaterally. Heart: Regular rate and rhythm. No rubs, murmurs, or gallops. Musculoskeletal: No edema, cyanosis, or clubbing. Neuro: Alert, answering all questions appropriately. Cranial nerves grossly intact. Skin: No rashes or petechiae noted. Psych: Normal affect. Lymphatics: No cervical, clavicular LAD.   LAB RESULTS:  Appointment on 10/12/2015  Component Date Value Ref Range Status  . WBC 10/12/2015 6.5  3.6 - 11.0 K/uL Final  . RBC 10/12/2015 5.32* 3.80 - 5.20 MIL/uL Final  . Hemoglobin 10/12/2015 14.2  12.0 - 16.0 g/dL Final  . HCT 10/12/2015 43.6  35.0 - 47.0 % Final  . MCV 10/12/2015 82.0  80.0 - 100.0 fL Final  . MCH 10/12/2015 26.8  26.0 - 34.0 pg Final  . MCHC 10/12/2015 32.7  32.0 - 36.0 g/dL Final  . RDW 10/12/2015 15.1* 11.5 - 14.5 % Final  . Platelets 10/12/2015 272  150 - 440 K/uL Final  . Neutrophils Relative % 10/12/2015 53   Final  . Neutro Abs 10/12/2015 3.5  1.4 - 6.5 K/uL Final  . Lymphocytes Relative 10/12/2015 29   Final  . Lymphs Abs 10/12/2015 1.9  1.0 - 3.6 K/uL Final  . Monocytes Relative 10/12/2015 12   Final  . Monocytes Absolute 10/12/2015 0.8  0.2 - 0.9 K/uL Final  . Eosinophils Relative 10/12/2015 5   Final  . Eosinophils Absolute 10/12/2015 0.3  0 - 0.7 K/uL Final  . Basophils Relative 10/12/2015 1   Final  . Basophils Absolute 10/12/2015 0.1  0 - 0.1 K/uL Final  . Sodium 10/12/2015 133* 135 - 145 mmol/L Final  . Potassium 10/12/2015 3.7  3.5 - 5.1 mmol/L Final  . Chloride 10/12/2015 96* 101 - 111 mmol/L Final  . CO2 10/12/2015 31  22 - 32 mmol/L Final  . Glucose, Bld 10/12/2015 141* 65 - 99 mg/dL Final  . BUN 10/12/2015 24* 6 - 20 mg/dL Final  . Creatinine, Ser 10/12/2015 1.13* 0.44 - 1.00 mg/dL Final  . Calcium 10/12/2015 9.1  8.9 - 10.3 mg/dL Final  . Total Protein 10/12/2015 7.7  6.5 - 8.1 g/dL Final  . Albumin 10/12/2015 3.9  3.5 - 5.0 g/dL Final  . AST 10/12/2015 19  15 - 41 U/L Final  . ALT 10/12/2015 19  14 - 54 U/L  Final  . Alkaline Phosphatase 10/12/2015 67  38 - 126 U/L Final  . Total Bilirubin 10/12/2015 0.6  0.3 - 1.2 mg/dL Final  . GFR calc non Af Amer 10/12/2015 47* >60 mL/min Final  . GFR calc Af Amer 10/12/2015 54* >60 mL/min Final   Comment: (NOTE) The eGFR has been calculated using the CKD EPI equation. This calculation has not been validated in all clinical situations. eGFR's persistently <60 mL/min signify possible Chronic Kidney Disease.   . Anion gap 10/12/2015 6  5 - 15 Final    STUDIES: No results found.  ASSESSMENT:  Erythrocytosis. History of bilateral pulmonary embolism. Persistent cough, sob, previously monitored lung nodule. Elevated CEA.   PLAN:  1. Erythrocytosis. Patient was originally diagnosed in October 2013 etiology unclear, questionable myeloproliferative disorder versus secondary etiology. In October 2013 serum EPO 33, COHb 1.5%, JAK2 mutation negative. Previously established goal hematocrit is less than 43  with phlebotomy if greater than 43. Today patient is 43.6. We'll proceed with a 300 ML phlebotomy today. Advised patient that we would recheck her labs and for a possible phlebotomy in approximately 4 months as previously scheduled by her hematologist. We will have patient return in approximately 8 months for lab, phlebotomy, and to see new provider. 2. History of bilateral pulmonary embolisms. Patient also originally diagnosed in October 2013. Patient has no significant personal or family history of thromboembolic/hypercoagulable history. She is currently on Coumadin that is managed by her primary care provider Dr. Ola Spurr. 3. Persistent cough, shortness of breath, previously seen lung lesion. Patient's most recent CT scan of chest was in December 2014 and reported as stable left base of lung nodule. At that time it was recommended since patient was low risk with no history of smoking that over the follow-up was necessary. However, patient has been having  increasing shortness of breath and a persistent cough for the last approximately 3-4 weeks, most likely related to sinus congestion and allergies but with shortness of breath and cough will obtain CT scan of chest without contrast for further evaluation.  4. Elevated CEA. CEA has previously been elevated and fluctuates. She has no acute complaints of weight loss, pain, and exam is otherwise unremarkable.   Patient expressed understanding and was in agreement with this plan. She also understands that She can call clinic at any time with any questions, concerns, or complaints.   Dr. Oliva Bustard was available for consultation and review of plan of care for this patient.   Evlyn Kanner, NP   10/12/2015 2:05 PM

## 2015-10-13 LAB — CEA: CEA: 6.3 ng/mL — ABNORMAL HIGH (ref 0.0–4.7)

## 2015-10-18 ENCOUNTER — Ambulatory Visit
Admission: RE | Admit: 2015-10-18 | Discharge: 2015-10-18 | Disposition: A | Discharge status: Home / Self Care | Payer: Medicare Other | Source: Ambulatory Visit | Attending: Family Medicine | Admitting: Family Medicine

## 2015-10-18 DIAGNOSIS — R911 Solitary pulmonary nodule: Secondary | ICD-10-CM | POA: Insufficient documentation

## 2015-10-18 DIAGNOSIS — I2510 Atherosclerotic heart disease of native coronary artery without angina pectoris: Secondary | ICD-10-CM | POA: Insufficient documentation

## 2015-10-18 DIAGNOSIS — R05 Cough: Secondary | ICD-10-CM | POA: Diagnosis not present

## 2015-10-18 DIAGNOSIS — K8020 Calculus of gallbladder without cholecystitis without obstruction: Secondary | ICD-10-CM | POA: Insufficient documentation

## 2015-10-18 DIAGNOSIS — R059 Cough, unspecified: Secondary | ICD-10-CM

## 2015-10-29 DIAGNOSIS — Z7901 Long term (current) use of anticoagulants: Secondary | ICD-10-CM | POA: Insufficient documentation

## 2016-02-09 ENCOUNTER — Inpatient Hospital Stay: Payer: Medicare Other

## 2016-02-09 ENCOUNTER — Inpatient Hospital Stay: Payer: Medicare Other | Attending: Internal Medicine

## 2016-02-09 DIAGNOSIS — D751 Secondary polycythemia: Secondary | ICD-10-CM | POA: Insufficient documentation

## 2016-02-09 LAB — HEMATOCRIT: HCT: 39.8 % (ref 35.0–47.0)

## 2016-02-14 DIAGNOSIS — R928 Other abnormal and inconclusive findings on diagnostic imaging of breast: Secondary | ICD-10-CM | POA: Insufficient documentation

## 2016-02-16 ENCOUNTER — Other Ambulatory Visit: Payer: Self-pay | Admitting: Infectious Diseases

## 2016-02-16 DIAGNOSIS — R928 Other abnormal and inconclusive findings on diagnostic imaging of breast: Secondary | ICD-10-CM

## 2016-03-16 ENCOUNTER — Ambulatory Visit
Admission: RE | Admit: 2016-03-16 | Discharge: 2016-03-16 | Disposition: A | Discharge status: Home / Self Care | Payer: Medicare Other | Source: Ambulatory Visit | Attending: Infectious Diseases | Admitting: Infectious Diseases

## 2016-03-16 DIAGNOSIS — R928 Other abnormal and inconclusive findings on diagnostic imaging of breast: Secondary | ICD-10-CM | POA: Insufficient documentation

## 2016-03-16 DIAGNOSIS — R921 Mammographic calcification found on diagnostic imaging of breast: Secondary | ICD-10-CM | POA: Diagnosis not present

## 2016-06-07 ENCOUNTER — Other Ambulatory Visit: Payer: Self-pay

## 2016-06-07 DIAGNOSIS — D751 Secondary polycythemia: Secondary | ICD-10-CM

## 2016-06-12 ENCOUNTER — Inpatient Hospital Stay: Payer: Medicare Other

## 2016-06-12 ENCOUNTER — Inpatient Hospital Stay: Payer: Medicare Other | Admitting: Internal Medicine

## 2016-06-13 ENCOUNTER — Other Ambulatory Visit: Payer: Self-pay

## 2016-06-13 ENCOUNTER — Inpatient Hospital Stay (HOSPITAL_BASED_OUTPATIENT_CLINIC_OR_DEPARTMENT_OTHER): Payer: Medicare Other

## 2016-06-13 ENCOUNTER — Inpatient Hospital Stay: Payer: Medicare Other | Attending: Internal Medicine | Admitting: Internal Medicine

## 2016-06-13 ENCOUNTER — Inpatient Hospital Stay: Payer: Medicare Other

## 2016-06-13 VITALS — BP 149/79 | HR 56 | Temp 96.7°F | Wt 171.5 lb

## 2016-06-13 DIAGNOSIS — D751 Secondary polycythemia: Secondary | ICD-10-CM | POA: Insufficient documentation

## 2016-06-13 DIAGNOSIS — R970 Elevated carcinoembryonic antigen [CEA]: Secondary | ICD-10-CM | POA: Diagnosis not present

## 2016-06-13 DIAGNOSIS — K219 Gastro-esophageal reflux disease without esophagitis: Secondary | ICD-10-CM | POA: Insufficient documentation

## 2016-06-13 DIAGNOSIS — Z86718 Personal history of other venous thrombosis and embolism: Secondary | ICD-10-CM | POA: Insufficient documentation

## 2016-06-13 DIAGNOSIS — I10 Essential (primary) hypertension: Secondary | ICD-10-CM | POA: Insufficient documentation

## 2016-06-13 DIAGNOSIS — E119 Type 2 diabetes mellitus without complications: Secondary | ICD-10-CM | POA: Insufficient documentation

## 2016-06-13 DIAGNOSIS — I2699 Other pulmonary embolism without acute cor pulmonale: Secondary | ICD-10-CM | POA: Insufficient documentation

## 2016-06-13 LAB — CBC WITH DIFFERENTIAL/PLATELET
BASOS PCT: 1 %
Basophils Absolute: 0.1 10*3/uL (ref 0–0.1)
EOS ABS: 0.3 10*3/uL (ref 0–0.7)
Eosinophils Relative: 5 %
HEMATOCRIT: 40.4 % (ref 35.0–47.0)
HEMOGLOBIN: 13.1 g/dL (ref 12.0–16.0)
LYMPHS ABS: 1.6 10*3/uL (ref 1.0–3.6)
Lymphocytes Relative: 30 %
MCH: 25.6 pg — AB (ref 26.0–34.0)
MCHC: 32.4 g/dL (ref 32.0–36.0)
MCV: 79.1 fL — ABNORMAL LOW (ref 80.0–100.0)
MONOS PCT: 13 %
Monocytes Absolute: 0.7 10*3/uL (ref 0.2–0.9)
NEUTROS ABS: 2.6 10*3/uL (ref 1.4–6.5)
NEUTROS PCT: 51 %
Platelets: 236 10*3/uL (ref 150–440)
RBC: 5.11 MIL/uL (ref 3.80–5.20)
RDW: 16.8 % — ABNORMAL HIGH (ref 11.5–14.5)
WBC: 5.1 10*3/uL (ref 3.6–11.0)

## 2016-06-13 LAB — COMPREHENSIVE METABOLIC PANEL
ALBUMIN: 3.8 g/dL (ref 3.5–5.0)
ALK PHOS: 58 U/L (ref 38–126)
ALT: 18 U/L (ref 14–54)
AST: 21 U/L (ref 15–41)
Anion gap: 8 (ref 5–15)
BILIRUBIN TOTAL: 0.5 mg/dL (ref 0.3–1.2)
BUN: 17 mg/dL (ref 6–20)
CALCIUM: 9.2 mg/dL (ref 8.9–10.3)
CO2: 31 mmol/L (ref 22–32)
CREATININE: 0.93 mg/dL (ref 0.44–1.00)
Chloride: 100 mmol/L — ABNORMAL LOW (ref 101–111)
GFR calc Af Amer: >60 mL/min (ref >60)
GFR calc non Af Amer: 59 mL/min — ABNORMAL LOW (ref >60)
GLUCOSE: 104 mg/dL — AB (ref 65–99)
Potassium: 4 mmol/L (ref 3.5–5.1)
SODIUM: 139 mmol/L (ref 135–145)
TOTAL PROTEIN: 6.7 g/dL (ref 6.5–8.1)

## 2016-06-13 NOTE — Progress Notes (Signed)
cc [name] returns today for follow-up for erythrocytosis   HPI: She's been receiving phlebotomy periodically since she was considered high risk for thrombotic events given her history of pulmonary embolism in the past.  She has been on Coumadin being followed by primary care. Apparently had latest INR was 1.8  Coumadin dose has been increased and she is scheduled to have a repeat PT/INR later this week.   She also has a prior history of persistently elevated CEA over the last few years this has fluctuated between 5 and 6 approximately without definite trend upwards.  She apparently was seen by GI  here at the clinic a couple of years ago and she had a colonoscopy was noted to be benign with a repeat suggested in 5 years.  ROS: She reports no bleeding or melena hematochezia. She notes no change in her bowel habits.  She denies any weight loss or change in appetite. No abdominal pain All other systems review is negative  Result review:  CBC today shows a hemoglobin of 13 hematocrit 14.  She had phlebotomy last in and 17 for a hematocrit of 43.6.  She is up-to-date with her mammograms.   Physical exam Vitals stable.  No extremity edema  no rashes no petechiae.  Oral cavity is normal  no palpable lymph nodes in the neck supraclavicular or axillary areas. Abdomen is soft and nontender no hepatosplenomegaly. AAOX3  Impression and plan: Erythrocytosis of unclear etiology. Jak 2  was reportedly negative.  I do not have her old records to confirm this However it appears that a goal of HEMATOCRIT 43 and under was set for phlebotomy.  Today she does not require phlebotomy with hematocrit being  40.  She continues Coumadin and follows with PCP for PE.  With regard to CEA there has been fluctuation over the years with no definite trend upwards. So no further follow-up is necessary on that.  She is up-to-date with her colonoscopies and mammography  Return for follow-up in 6  months or sooner for new problems.  Her pmh, social history and medications have been reviewed and unchanged from her last visit.

## 2016-12-11 ENCOUNTER — Inpatient Hospital Stay: Payer: Medicare Other | Admitting: *Deleted

## 2016-12-11 ENCOUNTER — Inpatient Hospital Stay: Payer: Medicare Other | Attending: Internal Medicine | Admitting: Internal Medicine

## 2016-12-11 DIAGNOSIS — Z9049 Acquired absence of other specified parts of digestive tract: Secondary | ICD-10-CM | POA: Insufficient documentation

## 2016-12-11 DIAGNOSIS — R918 Other nonspecific abnormal finding of lung field: Secondary | ICD-10-CM | POA: Diagnosis not present

## 2016-12-11 DIAGNOSIS — D751 Secondary polycythemia: Secondary | ICD-10-CM | POA: Insufficient documentation

## 2016-12-11 DIAGNOSIS — Z803 Family history of malignant neoplasm of breast: Secondary | ICD-10-CM | POA: Insufficient documentation

## 2016-12-11 DIAGNOSIS — Z79899 Other long term (current) drug therapy: Secondary | ICD-10-CM | POA: Insufficient documentation

## 2016-12-11 DIAGNOSIS — I2699 Other pulmonary embolism without acute cor pulmonale: Secondary | ICD-10-CM

## 2016-12-11 DIAGNOSIS — Z86711 Personal history of pulmonary embolism: Secondary | ICD-10-CM | POA: Diagnosis not present

## 2016-12-11 DIAGNOSIS — R970 Elevated carcinoembryonic antigen [CEA]: Secondary | ICD-10-CM | POA: Diagnosis not present

## 2016-12-11 DIAGNOSIS — Z809 Family history of malignant neoplasm, unspecified: Secondary | ICD-10-CM | POA: Insufficient documentation

## 2016-12-11 DIAGNOSIS — R911 Solitary pulmonary nodule: Secondary | ICD-10-CM

## 2016-12-11 DIAGNOSIS — Z7901 Long term (current) use of anticoagulants: Secondary | ICD-10-CM

## 2016-12-11 DIAGNOSIS — Z7982 Long term (current) use of aspirin: Secondary | ICD-10-CM | POA: Insufficient documentation

## 2016-12-11 NOTE — Assessment & Plan Note (Addendum)
#   2013- Bil PE-unprovoked. Continues on Coumadin. Tolerating well without any major side effects. Discussed regarding the length of anticoagulation 1 years versus indefinite.  Continue Coumadin for now [followed by PCP]  #  Abnormal CEA- most recent round 6. Discussed at length regarding multiple reasons for her elevated CEA. Unlikely related to malignancy.  # LLL nodule ~25mm; last CT march 2017- stable; benign.  # follow up in 12 months/ CBC/CEA/CMP.

## 2016-12-11 NOTE — Progress Notes (Signed)
[facility] OFFICE PROGRESS NOTE  Patient Care Team: Mick Sell, MD as PCP - General (Infectious Diseases)  Cancer Staging No matching staging information was found for the patient.    No history exists.   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 I reviewed the patient's prior charts/pertinent labs/imaging in detail; findings are summarized above.     INTERVAL HISTORY:  Sarah Sweeney 76 y.o.  female pleasant patient above history of bilateral PE on Coumadin; and also history of abnormal CEA is here for follow-up.  Patient denies any new shortness of breath or cough. Denies any bleeding episodes. Denies any falls. Denies any abdominal pain blood in stools or black stools.   REVIEW OF SYSTEMS:  A complete 10 point review of system is done which is negative except mentioned above/history of present illness.   PAST MEDICAL HISTORY :  Past Medical History:  Diagnosis Date  . Allergy   . Erythrocytosis 03/02/2015    PAST SURGICAL HISTORY :   Past Surgical History:  Procedure Laterality Date  . APPENDECTOMY      FAMILY HISTORY :   Family History  Problem Relation Age of Onset  . Breast cancer Maternal Aunt     60's  . Cancer Maternal Uncle   . Stroke Maternal Grandfather     SOCIAL HISTORY:   Social History  Substance Use Topics  . Smoking status: Never Smoker  . Smokeless tobacco: Never Used  . Alcohol use No    ALLERGIES:  has No Known Allergies.  MEDICATIONS:  Current Outpatient Prescriptions  Medication Sig Dispense Refill  . aspirin EC 81 MG tablet Take by mouth.    . hydrochlorothiazide (HYDRODIURIL) 25 MG tablet Take 25 mg by mouth daily.     Marland Kitchen latanoprost (XALATAN) 0.005 % ophthalmic solution Apply 1 drop to eye at bedtime.     Marland Kitchen omeprazole (PRILOSEC) 40 MG capsule Take 40 mg by mouth daily.      Marland Kitchen warfarin (COUMADIN) 1 MG tablet Take 1 mg by mouth. MWF- 3 mg, all other days of week - 2 mg     No current facility-administered medications for this visit.     PHYSICAL EXAMINATION: ECOG PERFORMANCE STATUS: 0 - Asymptomatic  BP (!) 179/76 (BP Location: Right Arm, Patient Position: Sitting)   Pulse (!) 48   Temp (!) 96.6 F (35.9 C) (Tympanic)   Resp 18   Ht  (1.651 m)   Wt 170 lb 12.8 oz (77.5 kg)   BMI 28.42 kg/m   Filed Weights   12/11/16 1459  Weight: 170 lb 12.8 oz (77.5 kg)    GENERAL: Well-nourished well-developed; Alert, no distress and comfortable.   Alone. EYES: no pallor or icterus OROPHARYNX: no thrush or ulceration; good dentition  NECK: supple, no masses felt LYMPH:  no palpable lymphadenopathy in the cervical, axillary or inguinal regions LUNGS: clear to auscultation and  No wheeze or crackles HEART/CVS: regular rate & rhythm and no murmurs; No lower extremity edema ABDOMEN:abdomen soft, non-tender and normal bowel sounds Musculoskeletal:no cyanosis of digits and no clubbing  PSYCH: alert & oriented x 3 with fluent speech NEURO: no focal motor/sensory deficits SKIN:  no rashes or significant lesions  LABORATORY DATA:  I have reviewed the data as listed    Component Value Date/Time   NA 139 06/13/2016 1400  NA 144 05/06/2012 0337   K 4.0 06/13/2016 1400   K 4.3 05/06/2012 0337   CL 100 (L) 06/13/2016 1400   CL 111 (H) 05/06/2012 0337   CO2 31 06/13/2016 1400   CO2 25 05/06/2012 0337   GLUCOSE 104 (H) 06/13/2016 1400   GLUCOSE 122 (H) 05/06/2012 0337   BUN 17 06/13/2016 1400   BUN 18 05/06/2012 0337   CREATININE 0.93 06/13/2016 1400   CREATININE 0.94 11/10/2014 1348   CALCIUM 9.2 06/13/2016 1400   CALCIUM 8.3 (L) 05/06/2012 0337   PROT 6.7 06/13/2016 1400   PROT 6.6 11/10/2014 1348   ALBUMIN 3.8 06/13/2016 1400   ALBUMIN 3.7 11/10/2014 1348   AST 21 06/13/2016 1400   AST 17 11/10/2014 1348   ALT 18 06/13/2016 1400   ALT 16  11/10/2014 1348   ALKPHOS 58 06/13/2016 1400   ALKPHOS 61 11/10/2014 1348   BILITOT 0.5 06/13/2016 1400   BILITOT 0.5 11/10/2014 1348   GFRNONAA 59 (L) 06/13/2016 1400   GFRNONAA >60 11/10/2014 1348   GFRAA >60 06/13/2016 1400   GFRAA >60 11/10/2014 1348    No results found for: SPEP, UPEP  Lab Results  Component Value Date   WBC 5.1 06/13/2016   NEUTROABS 2.6 06/13/2016   HGB 13.1 06/13/2016   HCT 40.4 06/13/2016   MCV 79.1 (L) 06/13/2016   PLT 236 06/13/2016      Chemistry      Component Value Date/Time   NA 139 06/13/2016 1400   NA 144 05/06/2012 0337   K 4.0 06/13/2016 1400   K 4.3 05/06/2012 0337   CL 100 (L) 06/13/2016 1400   CL 111 (H) 05/06/2012 0337   CO2 31 06/13/2016 1400   CO2 25 05/06/2012 0337   BUN 17 06/13/2016 1400   BUN 18 05/06/2012 0337   CREATININE 0.93 06/13/2016 1400   CREATININE 0.94 11/10/2014 1348      Component Value Date/Time   CALCIUM 9.2 06/13/2016 1400   CALCIUM 8.3 (L) 05/06/2012 0337   ALKPHOS 58 06/13/2016 1400   ALKPHOS 61 11/10/2014 1348   AST 21 06/13/2016 1400   AST 17 11/10/2014 1348   ALT 18 06/13/2016 1400   ALT 16 11/10/2014 1348   BILITOT 0.5 06/13/2016 1400   BILITOT 0.5 11/10/2014 1348       RADIOGRAPHIC STUDIES: I have personally reviewed the radiological images as listed and agreed with the findings in the report. No results found.   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 Continues on Coumadin. Tolerating well without any major side effects. Discussed regarding the length of anticoagulation 1 years versus indefinite.  Continue Coumadin for now [followed by PCP]  #  Abnormal CEA- most recent round 6. Discussed at length regarding multiple reasons for her elevated CEA. Unlikely related to malignancy.  # LLL nodule ~80mm; last CT march 2017- stable; benign.  # follow up in 12 months/ CBC/CEA/CMP.    Orders Placed This Encounter  Procedures  . CBC with  Differential/Platelet    Standing Status:   Future    Standing Expiration Date:   06/12/2018  . Comprehensive metabolic panel    Standing Status:   Future    Standing Expiration Date:   06/12/2018  . CEA    Standing Status:   Future    Standing Expiration Date:   06/12/2018   All questions were answered. The patient knows to call the clinic with any problems, questions or concerns.  Earna Coder, MD 12/13/2016 7:06 PM

## 2016-12-12 LAB — CEA: CEA: 6.5 ng/mL — ABNORMAL HIGH (ref 0.0–4.7)

## 2016-12-14 ENCOUNTER — Encounter: Payer: Self-pay | Admitting: *Deleted

## 2017-04-17 ENCOUNTER — Other Ambulatory Visit: Payer: Self-pay | Admitting: Allergy and Immunology

## 2017-04-17 ENCOUNTER — Other Ambulatory Visit: Payer: Self-pay | Admitting: Infectious Diseases

## 2017-04-17 DIAGNOSIS — Z1231 Encounter for screening mammogram for malignant neoplasm of breast: Secondary | ICD-10-CM

## 2017-04-26 ENCOUNTER — Ambulatory Visit
Admission: RE | Admit: 2017-04-26 | Discharge: 2017-04-26 | Disposition: A | Discharge status: Home / Self Care | Payer: Medicare Other | Source: Ambulatory Visit | Attending: Infectious Diseases | Admitting: Infectious Diseases

## 2017-04-26 DIAGNOSIS — Z1231 Encounter for screening mammogram for malignant neoplasm of breast: Secondary | ICD-10-CM

## 2017-12-12 ENCOUNTER — Inpatient Hospital Stay: Payer: Medicare Other | Admitting: Internal Medicine

## 2017-12-12 ENCOUNTER — Encounter: Payer: Self-pay | Admitting: Internal Medicine

## 2017-12-12 ENCOUNTER — Other Ambulatory Visit: Payer: Self-pay

## 2017-12-12 ENCOUNTER — Inpatient Hospital Stay: Payer: Medicare Other | Attending: Internal Medicine

## 2017-12-12 VITALS — BP 102/60 | HR 58 | Temp 98.6°F | Resp 18 | Ht 65.0 in | Wt 171.0 lb

## 2017-12-12 DIAGNOSIS — Z79899 Other long term (current) drug therapy: Secondary | ICD-10-CM | POA: Insufficient documentation

## 2017-12-12 DIAGNOSIS — Z7982 Long term (current) use of aspirin: Secondary | ICD-10-CM

## 2017-12-12 DIAGNOSIS — R970 Elevated carcinoembryonic antigen [CEA]: Secondary | ICD-10-CM | POA: Diagnosis not present

## 2017-12-12 DIAGNOSIS — I2699 Other pulmonary embolism without acute cor pulmonale: Secondary | ICD-10-CM

## 2017-12-12 DIAGNOSIS — Z86711 Personal history of pulmonary embolism: Secondary | ICD-10-CM | POA: Diagnosis present

## 2017-12-12 DIAGNOSIS — Z7901 Long term (current) use of anticoagulants: Secondary | ICD-10-CM

## 2017-12-12 LAB — COMPREHENSIVE METABOLIC PANEL
ALT: 18 U/L (ref 14–54)
AST: 23 U/L (ref 15–41)
Albumin: 3.7 g/dL (ref 3.5–5.0)
Alkaline Phosphatase: 67 U/L (ref 38–126)
Anion gap: 10 (ref 5–15)
BUN: 24 mg/dL — AB (ref 6–20)
CO2: 27 mmol/L (ref 22–32)
Calcium: 8.9 mg/dL (ref 8.9–10.3)
Chloride: 104 mmol/L (ref 101–111)
Creatinine, Ser: 1.17 mg/dL — ABNORMAL HIGH (ref 0.44–1.00)
GFR calc non Af Amer: 44 mL/min — ABNORMAL LOW (ref >60)
GFR, EST AFRICAN AMERICAN: 51 mL/min — AB (ref >60)
Glucose, Bld: 145 mg/dL — ABNORMAL HIGH (ref 65–99)
POTASSIUM: 3.7 mmol/L (ref 3.5–5.1)
SODIUM: 141 mmol/L (ref 135–145)
Total Bilirubin: 0.7 mg/dL (ref 0.3–1.2)
Total Protein: 6.8 g/dL (ref 6.5–8.1)

## 2017-12-12 LAB — CBC WITH DIFFERENTIAL/PLATELET
Basophils Absolute: 0 10*3/uL (ref 0–0.1)
Basophils Relative: 1 %
EOS ABS: 0.2 10*3/uL (ref 0–0.7)
Eosinophils Relative: 3 %
HCT: 43.3 % (ref 35.0–47.0)
HEMOGLOBIN: 14.4 g/dL (ref 12.0–16.0)
LYMPHS ABS: 1.3 10*3/uL (ref 1.0–3.6)
LYMPHS PCT: 22 %
MCH: 27.9 pg (ref 26.0–34.0)
MCHC: 33.2 g/dL (ref 32.0–36.0)
MCV: 83.9 fL (ref 80.0–100.0)
MONOS PCT: 11 %
Monocytes Absolute: 0.6 10*3/uL (ref 0.2–0.9)
NEUTROS PCT: 63 %
Neutro Abs: 3.6 10*3/uL (ref 1.4–6.5)
Platelets: 231 10*3/uL (ref 150–440)
RBC: 5.16 MIL/uL (ref 3.80–5.20)
RDW: 15.2 % — ABNORMAL HIGH (ref 11.5–14.5)
WBC: 5.8 10*3/uL (ref 3.6–11.0)

## 2017-12-12 NOTE — Assessment & Plan Note (Addendum)
#   2013- Bil PE-unprovoked.  Recommend indefinite anticoagulation; unless risk of anticoagulation is overweight by the benefits.  Coumadin is being followed by PCP.   #  Abnormal CEA- most recent round 6. Discussed at length regarding multiple reasons for her elevated CEA. Unlikely related to malignancy.  # LLL nodule ~74mm; last CT march 2017- stable; benign.  #Discussed regarding follow-ups with the patient; patient feels comfortable following up with PCP.  She will reach out to [place] if she needed she needs.

## 2017-12-12 NOTE — Progress Notes (Signed)
[facility] NOTE  Patient Care Team: Fitzgerald, David P, MD as PCP - General (Infectious Diseases)  Cancer Staging No matching staging information was found for the patient.    No history exists.   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  female pleasant patient above history of bilateral PE on Coumadin; and also history of abnormal CEA is here for follow-up.  Patient denies any blood in stools black loose stools.  Denies any falls.  Denies any shortness of breath or chest pain.  No bleeding episodes.  REVIEW OF SYSTEMS:  A complete 10 point review of system is done which is negative except mentioned above/history of present illness.   PAST MEDICAL HISTORY :  Past Medical History:  Diagnosis Date  . Allergy   . Erythrocytosis 03/02/2015    PAST SURGICAL HISTORY :   Past Surgical History:  Procedure Laterality Date  . APPENDECTOMY      FAMILY HISTORY :   Family History  Problem Relation Age of Onset  . Breast cancer Maternal Aunt        60's  . Cancer Maternal Uncle   . Stroke Maternal Grandfather     SOCIAL HISTORY:   Social History   Tobacco Use  . Smoking status: Never Smoker  . Smokeless tobacco: Never Used  Substance Use Topics  . Alcohol use: No  . Drug use: No    ALLERGIES:  has No Known Allergies.  MEDICATIONS:  Current Outpatient Medications  Medication Sig Dispense Refill  . aspirin EC 81 MG tablet Take by mouth.    . hydrochlorothiazide (HYDRODIURIL) 25 MG tablet Take 25 mg by mouth daily.     Marland Kitchen latanoprost (XALATAN) 0.005 % ophthalmic solution Apply 1 drop to eye at bedtime.     Marland Kitchen omeprazole (PRILOSEC) 40 MG capsule Take 40 mg by mouth daily.     Marland Kitchen warfarin (COUMADIN) 1 MG tablet Take 1 mg by mouth. MWF- 3 mg, all other days of week - 2 mg     No current facility-administered medications for this visit.      PHYSICAL EXAMINATION: ECOG PERFORMANCE STATUS: 0 - Asymptomatic  BP 102/60   Pulse (!) 58   Temp 98.6 F (37 C) (Oral)   Resp 18   Ht  (1.651 m)   Wt 171 lb (77.6 kg)   BMI 28.46 kg/m   Filed Weights   12/12/17 1417  Weight: 171 lb (77.6 kg)    GENERAL: Well-nourished well-developed; Alert, no distress and comfortable.   Alone. EYES: no pallor or icterus OROPHARYNX: no thrush or ulceration; good dentition  NECK: supple, no masses felt LYMPH:  no palpable lymphadenopathy in the cervical, axillary or inguinal regions LUNGS: clear to auscultation and  No wheeze or crackles HEART/CVS: regular rate & rhythm and no murmurs; No lower extremity edema ABDOMEN:abdomen soft, non-tender and normal bowel sounds Musculoskeletal:no cyanosis of digits and no clubbing  PSYCH: alert & oriented x 3 with fluent speech NEURO: no focal motor/sensory deficits SKIN:  no rashes or significant lesions  LABORATORY DATA:  I have reviewed the data as listed    Component Value Date/Time   NA 141 12/12/2017 1340   NA 144 05/06/2012 0337   K 3.7 12/12/2017 1340   K 4.3 05/06/2012 0337   CL 104 12/12/2017 1340   CL 111 (H) 05/06/2012 2956  CO2 27 12/12/2017 1340   CO2 25 05/06/2012 0337   GLUCOSE 145 (H) 12/12/2017 1340   GLUCOSE 122 (H) 05/06/2012 0337   BUN 24 (H) 12/12/2017 1340   BUN 18 05/06/2012 0337   CREATININE 1.17 (H) 12/12/2017 1340   CREATININE 0.94 11/10/2014 1348   CALCIUM 8.9 12/12/2017 1340   CALCIUM 8.3 (L) 05/06/2012 0337   PROT 6.8 12/12/2017 1340   PROT 6.6 11/10/2014 1348   ALBUMIN 3.7 12/12/2017 1340   ALBUMIN 3.7 11/10/2014 1348   AST 23 12/12/2017 1340   AST 17 11/10/2014 1348   ALT 18 12/12/2017 1340   ALT 16 11/10/2014 1348   ALKPHOS 67 12/12/2017 1340   ALKPHOS 61 11/10/2014 1348   BILITOT 0.7 12/12/2017 1340   BILITOT 0.5 11/10/2014 1348   GFRNONAA 44 (L) 12/12/2017 1340   GFRNONAA >60 11/10/2014 1348   GFRAA 51 (L) 12/12/2017 1340   GFRAA >60  11/10/2014 1348    No results found for: SPEP, UPEP  Lab Results  Component Value Date   WBC 5.8 12/12/2017   NEUTROABS 3.6 12/12/2017   HGB 14.4 12/12/2017   HCT 43.3 12/12/2017   MCV 83.9 12/12/2017   PLT 231 12/12/2017      Chemistry      Component Value Date/Time   NA 141 12/12/2017 1340   NA 144 05/06/2012 0337   K 3.7 12/12/2017 1340   K 4.3 05/06/2012 0337   CL 104 12/12/2017 1340   CL 111 (H) 05/06/2012 0337   CO2 27 12/12/2017 1340   CO2 25 05/06/2012 0337   BUN 24 (H) 12/12/2017 1340   BUN 18 05/06/2012 0337   CREATININE 1.17 (H) 12/12/2017 1340   CREATININE 0.94 11/10/2014 1348      Component Value Date/Time   CALCIUM 8.9 12/12/2017 1340   CALCIUM 8.3 (L) 05/06/2012 0337   ALKPHOS 67 12/12/2017 1340   ALKPHOS 61 11/10/2014 1348   AST 23 12/12/2017 1340   AST 17 11/10/2014 1348   ALT 18 12/12/2017 1340   ALT 16 11/10/2014 1348   BILITOT 0.7 12/12/2017 1340   BILITOT 0.5 11/10/2014 1348       RADIOGRAPHIC STUDIES: I have personally reviewed the radiological images as listed and agreed with the findings in the report. No results found.   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  Recommend indefinite anticoagulation; unless risk of anticoagulation is overweight by the benefits.  Coumadin is being followed by PCP.   #  Abnormal CEA- most recent round 6. Discussed at length regarding multiple reasons for her elevated CEA. Unlikely related to malignancy.  # LLL nodule ~58mm; last CT march 2017- stable; benign.  #Discussed regarding follow-ups with the patient; patient feels comfortable following up with PCP.  She will reach out to [place] if she needed she needs.    No orders of the defined types were placed in this encounter.  All questions were answered. The patient knows to call the clinic with any problems, questions or concerns.      Earna Coder, MD 12/25/2017 3:58 PM

## 2017-12-13 LAB — CEA: CEA1: 6.4 ng/mL — AB (ref 0.0–4.7)

## 2017-12-31 ENCOUNTER — Other Ambulatory Visit: Payer: Self-pay | Admitting: Physician Assistant

## 2017-12-31 ENCOUNTER — Ambulatory Visit
Admission: RE | Admit: 2017-12-31 | Discharge: 2017-12-31 | Disposition: A | Discharge status: Home / Self Care | Payer: Medicare Other | Source: Ambulatory Visit | Attending: Physician Assistant | Admitting: Physician Assistant

## 2017-12-31 DIAGNOSIS — M7989 Other specified soft tissue disorders: Secondary | ICD-10-CM | POA: Insufficient documentation

## 2017-12-31 DIAGNOSIS — I82A11 Acute embolism and thrombosis of right axillary vein: Secondary | ICD-10-CM

## 2017-12-31 DIAGNOSIS — M79604 Pain in right leg: Secondary | ICD-10-CM | POA: Insufficient documentation

## 2017-12-31 DIAGNOSIS — M79661 Pain in right lower leg: Secondary | ICD-10-CM

## 2018-06-07 ENCOUNTER — Other Ambulatory Visit: Payer: Self-pay | Admitting: Internal Medicine

## 2018-07-08 ENCOUNTER — Other Ambulatory Visit: Payer: Self-pay | Admitting: Internal Medicine

## 2018-07-08 DIAGNOSIS — Z1231 Encounter for screening mammogram for malignant neoplasm of breast: Secondary | ICD-10-CM

## 2018-07-22 ENCOUNTER — Encounter (HOSPITAL_COMMUNITY): Payer: Self-pay

## 2018-07-22 ENCOUNTER — Ambulatory Visit
Admission: RE | Admit: 2018-07-22 | Discharge: 2018-07-22 | Disposition: A | Discharge status: Home / Self Care | Payer: Medicare Other | Source: Ambulatory Visit | Attending: Internal Medicine | Admitting: Internal Medicine

## 2018-07-22 DIAGNOSIS — Z1231 Encounter for screening mammogram for malignant neoplasm of breast: Secondary | ICD-10-CM | POA: Diagnosis present

## 2018-07-24 ENCOUNTER — Other Ambulatory Visit: Payer: Self-pay | Admitting: Internal Medicine

## 2018-07-24 DIAGNOSIS — N6310 Unspecified lump in the right breast, unspecified quadrant: Secondary | ICD-10-CM

## 2018-07-24 DIAGNOSIS — R928 Other abnormal and inconclusive findings on diagnostic imaging of breast: Secondary | ICD-10-CM

## 2018-08-05 ENCOUNTER — Ambulatory Visit
Admission: RE | Admit: 2018-08-05 | Discharge: 2018-08-05 | Disposition: A | Discharge status: Home / Self Care | Payer: Medicare Other | Source: Ambulatory Visit | Attending: Internal Medicine | Admitting: Internal Medicine

## 2018-08-05 DIAGNOSIS — R928 Other abnormal and inconclusive findings on diagnostic imaging of breast: Secondary | ICD-10-CM

## 2018-08-05 DIAGNOSIS — N6310 Unspecified lump in the right breast, unspecified quadrant: Secondary | ICD-10-CM

## 2018-12-31 ENCOUNTER — Other Ambulatory Visit: Payer: Self-pay | Admitting: Internal Medicine

## 2018-12-31 DIAGNOSIS — N6489 Other specified disorders of breast: Secondary | ICD-10-CM

## 2019-01-02 ENCOUNTER — Other Ambulatory Visit: Payer: Self-pay | Admitting: Internal Medicine

## 2019-01-02 DIAGNOSIS — N6489 Other specified disorders of breast: Secondary | ICD-10-CM

## 2019-02-07 ENCOUNTER — Ambulatory Visit
Admission: RE | Admit: 2019-02-07 | Discharge: 2019-02-07 | Disposition: A | Discharge status: Home / Self Care | Payer: Medicare Other | Source: Ambulatory Visit | Attending: Internal Medicine | Admitting: Internal Medicine

## 2019-02-07 ENCOUNTER — Other Ambulatory Visit: Payer: Self-pay

## 2019-02-07 DIAGNOSIS — N6489 Other specified disorders of breast: Secondary | ICD-10-CM | POA: Diagnosis not present

## 2019-02-28 ENCOUNTER — Other Ambulatory Visit: Payer: Self-pay

## 2019-02-28 ENCOUNTER — Encounter
Admission: RE | Admit: 2019-02-28 | Discharge: 2019-02-28 | Disposition: A | Discharge status: Home / Self Care | Payer: Medicare Other | Source: Ambulatory Visit | Attending: Internal Medicine | Admitting: Internal Medicine

## 2019-02-28 DIAGNOSIS — Z01812 Encounter for preprocedural laboratory examination: Secondary | ICD-10-CM | POA: Diagnosis present

## 2019-02-28 DIAGNOSIS — Z1159 Encounter for screening for other viral diseases: Secondary | ICD-10-CM | POA: Insufficient documentation

## 2019-02-28 LAB — SARS CORONAVIRUS 2 (TAT 6-24 HRS): SARS Coronavirus 2: NEGATIVE

## 2019-03-03 ENCOUNTER — Encounter: Payer: Self-pay | Admitting: *Deleted

## 2019-03-04 ENCOUNTER — Ambulatory Visit: Payer: Medicare Other | Admitting: Certified Registered Nurse Anesthetist

## 2019-03-04 ENCOUNTER — Other Ambulatory Visit: Payer: Self-pay

## 2019-03-04 ENCOUNTER — Encounter: Payer: Self-pay | Admitting: Certified Registered Nurse Anesthetist

## 2019-03-04 ENCOUNTER — Encounter
Admission: RE | Disposition: A | Discharge status: Home / Self Care | Payer: Self-pay | Source: Home / Self Care | Attending: Internal Medicine

## 2019-03-04 ENCOUNTER — Ambulatory Visit
Admission: RE | Admit: 2019-03-04 | Discharge: 2019-03-04 | Disposition: A | Discharge status: Home / Self Care | Payer: Medicare Other | Attending: Internal Medicine | Admitting: Internal Medicine

## 2019-03-04 DIAGNOSIS — I10 Essential (primary) hypertension: Secondary | ICD-10-CM | POA: Insufficient documentation

## 2019-03-04 DIAGNOSIS — Z7901 Long term (current) use of anticoagulants: Secondary | ICD-10-CM | POA: Insufficient documentation

## 2019-03-04 DIAGNOSIS — Z8371 Family history of colonic polyps: Secondary | ICD-10-CM | POA: Insufficient documentation

## 2019-03-04 DIAGNOSIS — Z79899 Other long term (current) drug therapy: Secondary | ICD-10-CM | POA: Insufficient documentation

## 2019-03-04 DIAGNOSIS — E669 Obesity, unspecified: Secondary | ICD-10-CM | POA: Insufficient documentation

## 2019-03-04 DIAGNOSIS — Z1211 Encounter for screening for malignant neoplasm of colon: Secondary | ICD-10-CM | POA: Insufficient documentation

## 2019-03-04 DIAGNOSIS — K621 Rectal polyp: Secondary | ICD-10-CM | POA: Insufficient documentation

## 2019-03-04 DIAGNOSIS — D751 Secondary polycythemia: Secondary | ICD-10-CM | POA: Diagnosis not present

## 2019-03-04 DIAGNOSIS — E119 Type 2 diabetes mellitus without complications: Secondary | ICD-10-CM | POA: Diagnosis not present

## 2019-03-04 DIAGNOSIS — Z6827 Body mass index (BMI) 27.0-27.9, adult: Secondary | ICD-10-CM | POA: Diagnosis not present

## 2019-03-04 DIAGNOSIS — I4891 Unspecified atrial fibrillation: Secondary | ICD-10-CM

## 2019-03-04 DIAGNOSIS — K640 First degree hemorrhoids: Secondary | ICD-10-CM | POA: Insufficient documentation

## 2019-03-04 DIAGNOSIS — K219 Gastro-esophageal reflux disease without esophagitis: Secondary | ICD-10-CM | POA: Insufficient documentation

## 2019-03-04 DIAGNOSIS — Z7982 Long term (current) use of aspirin: Secondary | ICD-10-CM | POA: Diagnosis not present

## 2019-03-04 DIAGNOSIS — Z86711 Personal history of pulmonary embolism: Secondary | ICD-10-CM

## 2019-03-04 DIAGNOSIS — K5730 Diverticulosis of large intestine without perforation or abscess without bleeding: Secondary | ICD-10-CM | POA: Diagnosis not present

## 2019-03-04 HISTORY — DX: Type 2 diabetes mellitus without complications: E11.9

## 2019-03-04 HISTORY — DX: Gastro-esophageal reflux disease without esophagitis: K21.9

## 2019-03-04 HISTORY — DX: Headache, unspecified: R51.9

## 2019-03-04 HISTORY — DX: Essential (primary) hypertension: I10

## 2019-03-04 HISTORY — PX: COLONOSCOPY WITH PROPOFOL: SHX5780

## 2019-03-04 LAB — BASIC METABOLIC PANEL
Anion gap: 10 (ref 5–15)
BUN: 14 mg/dL (ref 8–23)
CO2: 26 mmol/L (ref 22–32)
Calcium: 8.8 mg/dL — ABNORMAL LOW (ref 8.9–10.3)
Chloride: 101 mmol/L (ref 98–111)
Creatinine, Ser: 0.97 mg/dL (ref 0.44–1.00)
GFR calc Af Amer: >60 mL/min (ref >60)
GFR calc non Af Amer: 56 mL/min — ABNORMAL LOW (ref >60)
Glucose, Bld: 99 mg/dL (ref 70–99)
Potassium: 4.1 mmol/L (ref 3.5–5.1)
Sodium: 137 mmol/L (ref 135–145)

## 2019-03-04 LAB — TSH: TSH: 3.89 u[IU]/mL (ref 0.350–4.500)

## 2019-03-04 LAB — MAGNESIUM: Magnesium: 2.2 mg/dL (ref 1.7–2.4)

## 2019-03-04 SURGERY — COLONOSCOPY WITH PROPOFOL
Anesthesia: General

## 2019-03-04 MED ORDER — LIDOCAINE HCL (CARDIAC) PF 100 MG/5ML IV SOSY
PREFILLED_SYRINGE | INTRAVENOUS | Status: DC | PRN
  Administered 2019-03-04: 50 mg via INTRAVENOUS

## 2019-03-04 MED ORDER — PROPOFOL 500 MG/50ML IV EMUL
INTRAVENOUS | Status: DC | PRN
  Administered 2019-03-04: 130 ug/kg/min via INTRAVENOUS

## 2019-03-04 MED ORDER — SODIUM CHLORIDE 0.9 % IV SOLN
INTRAVENOUS | Status: DC
  Administered 2019-03-04: 10:00:00 1000 mL via INTRAVENOUS

## 2019-03-04 MED ORDER — PROPOFOL 500 MG/50ML IV EMUL
INTRAVENOUS | Status: AC
  Filled 2019-03-04: qty 50

## 2019-03-04 MED ORDER — LIDOCAINE HCL (PF) 2 % IJ SOLN
INTRAMUSCULAR | Status: AC
  Filled 2019-03-04: qty 10

## 2019-03-04 MED ORDER — PROPOFOL 10 MG/ML IV BOLUS
INTRAVENOUS | Status: DC | PRN
  Administered 2019-03-04: 70 mg via INTRAVENOUS

## 2019-03-04 NOTE — Progress Notes (Signed)
53 am Dr. Saunders Revel arrived to evaluate the patient for new onset of irregular heart rhythm.

## 2019-03-04 NOTE — H&P (Signed)
Outpatient short stay form Pre-procedure 03/04/2019 10:14 AM Sarah Sweeney K. Alice Reichert, M.D.  Primary Physician: Harrel Lemon, M.D.  Reason for visit:  Family history of colon polyps.  History of present illness:  78 y/o female presents for colon cancer screening, increased risk, due to family history of colon polyps. Patient denies change in bowel habits, rectal bleeding, weight loss or abdominal pain.     Current Facility-Administered Medications:  .  0.9 %  sodium chloride infusion, , Intravenous, Continuous, Dyer, Benay Pike, MD, Last Rate: 20 mL/hr at 03/04/19 1006, 1,000 mL at 03/04/19 1006  Medications Prior to Admission  Medication Sig Dispense Refill Last Dose  . losartan (COZAAR) 50 MG tablet Take 50 mg by mouth daily.     Marland Kitchen oxybutynin (DITROPAN-XL) 10 MG 24 hr tablet Take 10 mg by mouth at bedtime.     Marland Kitchen aspirin EC 81 MG tablet Take by mouth.     . hydrochlorothiazide (HYDRODIURIL) 25 MG tablet Take 25 mg by mouth daily.      Marland Kitchen latanoprost (XALATAN) 0.005 % ophthalmic solution Apply 1 drop to eye at bedtime.      Marland Kitchen omeprazole (PRILOSEC) 40 MG capsule Take 40 mg by mouth daily.      Marland Kitchen warfarin (COUMADIN) 1 MG tablet Take 1 mg by mouth. MWF- 3 mg, all other days of week - 2 mg   02/27/2019     No Known Allergies   Past Medical History:  Diagnosis Date  . Allergy   . Diabetes mellitus without complication ([place])   . Erythrocytosis 03/02/2015  . GERD (gastroesophageal reflux disease)   . Headache   . Hypertension   . Pulmonary embolism (HCC)    Coumadin Therapy    Review of systems:  Otherwise negative.    Physical Exam  Gen: Alert, oriented. Appears stated age.  HEENT: [place]/AT. PERRLA. Lungs: CTA, no wheezes. CV: RR nl S1, S2. Abd: soft, benign, no masses. BS+ Ext: No edema. Pulses 2+    Planned procedures: Proceed with colonoscopy. The patient understands the nature of the planned procedure, indications, risks, alternatives and potential complications including  but not limited to bleeding, infection, perforation, damage to internal organs and possible oversedation/side effects from anesthesia. The patient agrees and gives consent to proceed.  Please refer to procedure notes for findings, recommendations and patient disposition/instructions.     Khing Belcher K. Alice Reichert, M.D. Gastroenterology 03/04/2019  10:14 AM

## 2019-03-04 NOTE — Anesthesia Post-op Follow-up Note (Signed)
Anesthesia QCDR form completed.        

## 2019-03-04 NOTE — Transfer of Care (Addendum)
Immediate Anesthesia Transfer of Care Note  Patient: Sarah Sweeney  Procedure(s) Performed: COLONOSCOPY WITH PROPOFOL (N/A )  Patient Location: PACU and Endoscopy Unit  Anesthesia Type:General  Level of Consciousness: drowsy  Airway & Oxygen Therapy: Patient Spontanous Breathing  Post-op Assessment: Report given to RN and Post -op Vital signs reviewed and stable  Post vital signs: Reviewed and stable  Last Vitals: see nursing flow sheet for vital signs Vitals Value Taken Time  BP    Temp    Pulse    Resp    SpO2      Last Pain:  Vitals:   03/04/19 0944  TempSrc: Tympanic  PainSc: 0-No pain         Complications: No apparent anesthesia complications

## 2019-03-04 NOTE — Consult Note (Addendum)
Cardiology Consultation:   Patient ID: Sarah Sweeney; [id number]; [birth year]   Admit date: 03/04/2019 Date of Consult: 03/04/2019  Primary Care Provider: Baxter Hire, MD Primary Cardiologist: new to [facility] - consult by Lataunya Ruud   Patient Profile:   Sarah Sweeney is a 78 y.o. female with a hx of unprovoked bilateral PE in 2013 on chronic Coumadin, diabetes, hypertension, erythrocytosis, GERD, and obesity who is being seen today for the evaluation of new onset A. fib with slow ventricular response at the request of Dr. Alice Reichert.  History of Present Illness:   Sarah Sweeney has no previously known cardiac history.  She was diagnosed with unprovoked bilateral PE in 2013 and was remotely evaluated by outside cardiology group at that time without further follow-up.  She has been managed on chronic Coumadin in the setting of her bilateral PEs.  She underwent screening colonoscopy this morning and postprocedure was noted to be in new onset A. fib with slow ventricular response with ventricular rates in the 50s to 60s bpm.  EKG showed new onset A. fib with slow ventricular response, 50 bpm, anterolateral T wave inversion as outlined below.  At baseline, the patient does not take any rate limiting medications.  She was asymptomatic.  She denies any chest pain, shortness of breath, dizziness, presyncope, or syncope.  No lower extreme swelling, diagnosis and, orthopnea, PND, early satiety.  She denies any falls, BRBPR, or melena.  She did have 2 polyps removed during her procedure today and notes indicate she has been cleared to resume oral anticoagulation at this time.  She does note some thoracic back pain if she becomes anxious or nervous which typically happens approximately once per week.  No prior ischemic evaluation on file for review.  Patient currently feels well and is without complaint.   Past Medical History:  Diagnosis Date   Allergy    Diabetes mellitus without complication ([place])     Erythrocytosis 03/02/2015   GERD (gastroesophageal reflux disease)    Headache    Hypertension    Pulmonary embolism (HCC)    Coumadin Therapy    Past Surgical History:  Procedure Laterality Date   APPENDECTOMY     TONSILLECTOMY       Home Meds: Prior to Admission medications   Medication Sig Start Date Jyles Sontag Date Taking? Authorizing Provider  losartan (COZAAR) 50 MG tablet Take 50 mg by mouth daily.   Yes [provider]  oxybutynin (DITROPAN-XL) 10 MG 24 hr tablet Take 10 mg by mouth at bedtime.   Yes [provider]  aspirin EC 81 MG tablet Take by mouth.    [provider]  hydrochlorothiazide (HYDRODIURIL) 25 MG tablet Take 25 mg by mouth daily.  08/24/15   [provider]  latanoprost (XALATAN) 0.005 % ophthalmic solution Apply 1 drop to eye at bedtime.  02/02/14   [provider]  omeprazole (PRILOSEC) 40 MG capsule Take 40 mg by mouth daily.  08/24/15   [provider]  warfarin (COUMADIN) 1 MG tablet Take 1 mg by mouth. MWF- 3 mg, all other days of week - 2 mg 02/14/16   [provider]    Inpatient Medications: Scheduled Meds:  Continuous Infusions:  sodium chloride 1,000 mL (03/04/19 1006)   PRN Meds:   Allergies:  No Known Allergies  Social History:   Social History   Socioeconomic History   Marital status: Single    Spouse name: Not on file   Number of  children: Not on file   Years of education: Not on file   Highest education level: Not on file  Occupational History   Not on file  Social Needs   Financial resource strain: Not on file   Food insecurity    Worry: Not on file    Inability: Not on file   Transportation needs    Medical: Not on file    Non-medical: Not on file  Tobacco Use   Smoking status: Never Smoker   Smokeless tobacco: Never Used  Substance and Sexual Activity   Alcohol use: No   Drug use: No   Sexual activity: Not Currently  Lifestyle   Physical  activity    Days per week: Not on file    Minutes per session: Not on file   Stress: Not on file  Relationships   Social connections    Talks on phone: Not on file    Gets together: Not on file    Attends religious service: Not on file    Active member of club or organization: Not on file    Attends meetings of clubs or organizations: Not on file    Relationship status: Not on file   Intimate partner violence    Fear of current or ex partner: Not on file    Emotionally abused: Not on file    Physically abused: Not on file    Forced sexual activity: Not on file  Other Topics Concern   Not on file  Social History Narrative   Not on file     Family History:  Family History  Problem Relation Age of Onset   Breast cancer Maternal Aunt        60's   Cancer Maternal Uncle    Stroke Maternal Grandfather     ROS:  Review of Systems  Constitutional: Negative for chills, diaphoresis, fever, malaise/fatigue and weight loss.  HENT: Negative for congestion.   Eyes: Negative for discharge and redness.  Respiratory: Negative for cough, hemoptysis, sputum production, shortness of breath and wheezing.   Cardiovascular: Negative for chest pain, palpitations, orthopnea, claudication, leg swelling and PND.  Gastrointestinal: Negative for abdominal pain, blood in stool, heartburn, melena, nausea and vomiting.  Genitourinary: Negative for hematuria.  Musculoskeletal: Positive for back pain. Negative for falls and myalgias.  Skin: Negative for rash.  Neurological: Negative for dizziness, tingling, tremors, sensory change, speech change, focal weakness, loss of consciousness and weakness.  Endo/Heme/Allergies: Does not bruise/bleed easily.  Psychiatric/Behavioral: Negative for substance abuse. The patient is nervous/anxious.   All other systems reviewed and are negative.     Physical Exam/Data:   Vitals:   03/04/19 1103 03/04/19 1113 03/04/19 1123 03/04/19 1133  BP: 110/70 129/73  (!) 147/82 (!) 141/99  Pulse: 67 (!) 56 (!) 48 (!) 106  Resp: 16 20 16 18   Temp:      TempSrc:      SpO2: 98% 100% 100% 93%  Weight:      Height:        Intake/Output Summary (Last 24 hours) at 03/04/2019 1208 Last data filed at 03/04/2019 1047 Gross per 24 hour  Intake 400 ml  Output --  Net 400 ml   Filed Weights   03/04/19 0944  Weight: 74.4 kg   Body mass index is 27.29 kg/m.   Physical Exam: General: Well developed, well nourished, in no acute distress. Head: Normocephalic, atraumatic, sclera non-icteric, no xanthomas, nares without discharge.  Neck: Negative for carotid bruits. JVD not  elevated. Lungs: Clear bilaterally to auscultation without wheezes, rales, or rhonchi. Breathing is unlabored. Heart: Bradycardic, irregularly irregular with S1 S2. No murmurs, rubs, or gallops appreciated. Abdomen: Soft, non-tender, non-distended with normoactive bowel sounds. No hepatomegaly. No rebound/guarding. No obvious abdominal masses. Msk:  Strength and tone appear normal for age. Extremities: No clubbing or cyanosis. No edema. Distal pedal pulses are 2+ and equal bilaterally. Neuro: Alert and oriented X 3. No facial asymmetry. No focal deficit. Moves all extremities spontaneously. Psych:  Responds to questions appropriately with a normal affect.   EKG:  The EKG was personally reviewed and demonstrates: A. fib with slow ventricular response, 50 bpm, anterolateral T wave inversion Telemetry:  Telemetry was personally reviewed and demonstrates: A. fib with slow ventricular response, ventricular rates 50s to 60s bpm  Weights: Filed Weights   03/04/19 0944  Weight: 74.4 kg    Relevant CV Studies: None  Laboratory Data:  ChemistryNo results for input(s): NA, K, CL, CO2, GLUCOSE, BUN, CREATININE, CALCIUM, GFRNONAA, GFRAA, ANIONGAP in the last 168 hours.  No results for input(s): PROT, ALBUMIN, AST, ALT, ALKPHOS, BILITOT in the last 168 hours. HematologyNo results for  input(s): WBC, RBC, HGB, HCT, MCV, MCH, MCHC, RDW, PLT in the last 168 hours. Cardiac EnzymesNo results for input(s): TROPONINI in the last 168 hours. No results for input(s): TROPIPOC in the last 168 hours.  BNPNo results for input(s): BNP, PROBNP in the last 168 hours.  DDimer No results for input(s): DDIMER in the last 168 hours.  Radiology/Studies:  No results found.  Assessment and Plan:   1.  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: -Likely in the setting of recent colonoscopy with possible electrolyte abnormalities following prep -Ventricular rate remains bradycardic in the low to mid 50s with occasional 60s bpm -Not on rate limiting medication at home prior to presentation -Asymptomatic while laying in bed -Recommend checking BMP, magnesium, and TSH with recommendation to repeat potassium and magnesium to goal 4.0 and 2.0 respectively as indicated -Ambulate to assess heart rate and for symptoms -If heart rate remains well controlled and she is asymptomatic with ambulation she should be able to be discharged from endoscopy later today.  However if ventricular rate is difficult to control with ambulation or if she is symptomatic with ambulation would recommend primary service contacted internal medicine for admission -Resume OAC at the direction of GI which note indicates she is okay to do so later today -We did discuss with the patient the option of transitioning from Coumadin to DOAC, as now would be the easiest time given she is already off Coumadin.  She would like to think about this and will let us know -Plan for echocardiogram and outpatient follow-up to evaluate for structural heart disease -Would avoid beta-blocker and non-dihydropyridine calcium channel blocker given bradycardic heart rates -If she remains in A. fib and outpatient follow-up following adequate, uninterrupted anticoagulation we can visit the possibility of cardioversion at that time if she would like  2.   History of bilateral PE: -Unprovoked bilateral PE diagnosed in 2013 -Has been maintained on chronic Coumadin -Followed by PCP/hematology -GI has noted okay for patient to resume Coumadin -Patient is considering transitioning from Coumadin to DOAC, she will let us know her decision  3.  Hypertension: -Blood pressure mildly elevated -Continue to monitor -Would avoid beta-blocker and non-dihydropyridine calcium channel blocker given underlying bradycardic heart rates  4.  Obesity: -Weight loss advised -Further discussion regarding potential outpatient sleep study in follow-up  5.  DM2: -Most recent A1c from 12/2024.6 -Per PCP  6.  Hyperlipidemia: -LDL from 12/2018 of 115  7.  Recent colonoscopy: -Per GI  8.  Thoracic back pain: -Seems to be associated with anxiety/nerves -Follow-up as outpatient for further discussion of potential noninvasive ischemic evaluation   For questions or updates, please contact [facility] Please consult [email or web address] for contact info under Cardiology/STEMI.   Signed, Eula Listenyan Dunn, PA-C [facility] Pager: [phone] 03/04/2019, 12:08 PM  I have independently seen and examined the patient and agree with the findings and plan, as documented in the PA/NP's note, with the following additions/changes.  [name] is a 78 year old woman with history of unprovoked pulmonary embolism in 2013 on chronic warfarin, hypertension, type 2 diabetes mellitus, erythrocytosis, GERD, and obesity, whom we have been asked to see due to new onset of atrial fibrillation following colonoscopy.  She presented for screening colonoscopy today and was noted to be bradycardic at the Dajanae Brophy of the procedure.  Telemetry and EKG showed atrial fibrillation with slow ventricular response.  [name] is currently asymptomatic, denying chest pain, shortness of breath, palpitations, and lightheadedness.  She denies a history of prior cardiac problems.  She has occasional exertional  dyspnea, which has worsened slightly over the last 6 months.  She also notes occasional pain in her upper back when she is anxious.  She does not have any exertional back or chest pain.  Exam notable for comfortable appearing woman, lying on a stretcher in endoscopy.  Heart sounds are irregularly irregular without murmurs.  Lungs are clear bilaterally.  Abdomen is soft and nontender.  There is no lower extremity edema or JVD.  EKG (personally reviewed) demonstrates atrial fibrillation with slow ventricular response and biphasic T waves in the anterolateral leads.  In summary, [name] is a 78 year old woman presenting for screening colonoscopy, complicated by new atrial fibrillation.  She is asymptomatic at this time and actually somewhat hypertensive.  I recommend checking a basic metabolic panel, magnesium level, and TSH to look for potential reversible causes for her atrial fibrillation.  In particular, electrolyte disturbances from being n.p.o. and receiving bowel prep can precipitate atrial fibrillation.  If labs are unrevealing and the patient is able to ambulate without symptoms or significant bradycardia (persistent heart rate less than 50 bpm) or tachycardia (resting heart rate greater than 110 bpm), I think it would be reasonable for her to be discharged home and to follow-up with us in the office in 1 to 2 weeks.  In the interim, we will arrange for an outpatient echocardiogram.  EKG is notable for some anterolateral T wave inversions.  We will consider outpatient ischemia evaluation.  Anticoagulation should be restarted later today; we brought up the possibility of switching from warfarin to a novel oral anticoagulate at this time, which the patient will think about.  Barring desire to switch, warfarin should be resumed tonight with ongoing management through her anticoagulation clinic.  Yvonne Kendallhristopher Charleen Madera, MD [facility] Pager: [phone]

## 2019-03-04 NOTE — OR Nursing (Signed)
12 lead EKG was done per Dr. Nadine Counts order, result called to him, Cardiology consult requested.  Spoke with Dr. Gerald Stabs End from cardiology, someone from cardiology will come to evaluate patient as soon as they can.

## 2019-03-04 NOTE — Progress Notes (Signed)
From: Nelva Bush, MD  Sent: 03/04/2019  1:30 PM EDT  To: [facility] Triage, [facility] Scheduling  Subject: Follow-up                     Hello,   [name] was seen as a new consult for a-fib in endoscopy today. She will be discharged shortly. Can you help arrange for f/u with me or an APP in ~2 weeks, as well as an echo in the meantime for a-fib and shortness of breath? Please let me know if any questions or concerns come up. Thanks.   Gerald Stabs

## 2019-03-04 NOTE — Progress Notes (Signed)
Blood work returned and spoke  To Dr. Saunders Revel said she could go and the office would call for a follow up appointment .

## 2019-03-04 NOTE — Op Note (Signed)
[facility] Gastroenterology Patient Name: Sarah Sweeney Procedure Date: 03/04/2019 10:25 AM MRN: [id number] Account #: [id number] Date of Birth: 1940/10/11 Admit Type: Outpatient Age: [age] [facility] 3 Gender: Female Note Status: Finalized Procedure:            Colonoscopy Indications:          Colon cancer screening in patient at increased risk:                        Family history of 1st-degree relative with colon polyps Providers:            Boykin Nearingeodoro K. Norma Fredricksonoledo MD, MD Referring MD:         Gracelyn NurseJohn D. Johnston, MD (Referring MD) Medicines:            Propofol per Anesthesia Complications:        No immediate complications. Procedure:            Pre-Anesthesia Assessment:                       - The risks and benefits of the procedure and the                        sedation options and risks were discussed with the                        patient. All questions were answered and informed                        consent was obtained.                       - Patient identification and proposed procedure were                        verified prior to the procedure by the nurse. The                        procedure was verified in the procedure room.                       - ASA Grade Assessment: III - A patient with severe                        systemic disease.                       - After reviewing the risks and benefits, the patient                        was deemed in satisfactory condition to undergo the                        procedure.                       After obtaining informed consent, the colonoscope was                        passed under direct vision. Throughout the procedure,  the patient's blood pressure, pulse, and oxygen                        saturations were monitored continuously. The                        Colonoscope was introduced through the anus and                        advanced to the the cecum, identified by  appendiceal                        orifice and ileocecal valve. The colonoscopy was                        somewhat difficult due to significant looping.                        Successful completion of the procedure was aided by                        straightening and shortening the scope to obtain bowel                        loop reduction and applying abdominal pressure. The                        patient tolerated the procedure well. The quality of                        the bowel preparation was adequate. Findings:      The perianal and digital rectal examinations were normal. Pertinent       negatives include normal sphincter tone and no palpable rectal lesions.      Multiple small and large-mouthed diverticula were found in the left       colon. There was no evidence of diverticular bleeding.      Many small-mouthed diverticula were found in the right colon. There was       no evidence of diverticular bleeding.      Two sessile polyps were found in the rectum. The polyps were 3 to 4 mm       in size. These polyps were removed with a jumbo cold forceps. Resection       and retrieval were complete.      Non-bleeding internal hemorrhoids were found during retroflexion. The       hemorrhoids were Grade I (internal hemorrhoids that do not prolapse).      The exam was otherwise without abnormality. Impression:           - Moderate diverticulosis in the left colon. There was                        no evidence of diverticular bleeding.                       - Mild diverticulosis in the right colon. There was no                        evidence of diverticular bleeding.                       -  Two 3 to 4 mm polyps in the rectum, removed with a                        jumbo cold forceps. Resected and retrieved.                       - Non-bleeding internal hemorrhoids.                       - The examination was otherwise normal. Recommendation:       - Patient has a contact number available  for                        emergencies. The signs and symptoms of potential                        delayed complications were discussed with the patient.                        Return to normal activities tomorrow. Written discharge                        instructions were provided to the patient.                       - Resume previous diet.                       - Continue present medications.                       - Resume Coumadin (warfarin) at prior dose today. Refer                        to managing physician for further adjustment of therapy.                       - Await pathology results.                       - No repeat colonoscopy due to age.                       - Return to GI clinic PRN. Procedure Code(s):    --- Professional ---                       [phone], Colonoscopy, flexible; with biopsy, single or                        multiple Diagnosis Code(s):    --- Professional ---                       K57.30, Diverticulosis of large intestine without                        perforation or abscess without bleeding                       K62.1, Rectal polyp                       K64.0,  First degree hemorrhoids                       Z83.71, Family history of colonic polyps CPT copyright 2019 American Medical Association. All rights reserved. The codes documented in this report are preliminary and upon coder review may  be revised to meet current compliance requirements. Efrain Sella MD, MD 03/04/2019 10:52:26 AM This report has been signed electronically. Number of Addenda: 0 Note Initiated On: 03/04/2019 10:25 AM Scope Withdrawal Time: 0 hours 6 minutes 6 seconds  Total Procedure Duration: 0 hours 16 minutes 50 seconds  Estimated Blood Loss: Estimated blood loss was minimal.      [facility]

## 2019-03-04 NOTE — Interval H&P Note (Signed)
History and Physical Interval Note:  03/04/2019 10:16 AM  Sarah Sweeney  has presented today for surgery, with the diagnosis of FAMILY HX.OF COLON POLYPS.  The various methods of treatment have been discussed with the patient and family. After consideration of risks, benefits and other options for treatment, the patient has consented to  Procedure(s): COLONOSCOPY WITH PROPOFOL (N/A) as a surgical intervention.  The patient's history has been reviewed, patient examined, no change in status, stable for surgery.  I have reviewed the patient's chart and labs.  Questions were answered to the patient's satisfaction.     [place], [place]

## 2019-03-04 NOTE — Anesthesia Postprocedure Evaluation (Signed)
Anesthesia Post Note  Patient: Sarah Sweeney  Procedure(s) Performed: COLONOSCOPY WITH PROPOFOL (N/A )  Patient location during evaluation: Endoscopy Anesthesia Type: General Level of consciousness: awake and alert Pain management: pain level controlled Vital Signs Assessment: post-procedure vital signs reviewed and stable Respiratory status: spontaneous breathing, nonlabored ventilation, respiratory function stable and patient connected to nasal cannula oxygen Cardiovascular status: blood pressure returned to baseline and stable Postop Assessment: no apparent nausea or vomiting Anesthetic complications: no     Last Vitals:  Vitals:   03/04/19 1213 03/04/19 1228  BP: (!) 156/73 (!) 178/75  Pulse: 63 61  Resp: 19 15  Temp:    SpO2: 98% 98%    Last Pain:  Vitals:   03/04/19 1228  TempSrc:   PainSc: 0-No pain                 Martha Clan

## 2019-03-04 NOTE — Anesthesia Preprocedure Evaluation (Signed)
Anesthesia Evaluation  Patient identified by MRN, date of birth, ID band Patient awake    Reviewed: Allergy & Precautions, H&P , NPO status , Patient's Chart, lab work & pertinent test results, reviewed documented beta blocker date and time   History of Anesthesia Complications Negative for: history of anesthetic complications  Airway Mallampati: II  TM Distance: >3 FB Neck ROM: full    Dental  (+) Dental Advidsory Given, Missing   Pulmonary neg pulmonary ROS,    Pulmonary exam normal        Cardiovascular Exercise Tolerance: Good hypertension, (-) angina(-) Past MI and (-) Cardiac Stents Normal cardiovascular exam(-) dysrhythmias (-) Valvular Problems/Murmurs     Neuro/Psych negative neurological ROS  negative psych ROS   GI/Hepatic Neg liver ROS, GERD  ,  Endo/Other  diabetes  Renal/GU negative Renal ROS  negative genitourinary   Musculoskeletal   Abdominal   Peds  Hematology negative hematology ROS (+)   Anesthesia Other Findings Past Medical History: No date: Allergy No date: Diabetes mellitus without complication ([place]) 3/61/4431: Erythrocytosis No date: GERD (gastroesophageal reflux disease) No date: Headache No date: Hypertension No date: Pulmonary embolism (HCC)     Comment:  Coumadin Therapy   Reproductive/Obstetrics negative OB ROS                             Anesthesia Physical Anesthesia Plan  ASA: II  Anesthesia Plan: General   Post-op Pain Management:    Induction: Intravenous  PONV Risk Score and Plan: 3 and Propofol infusion and TIVA  Airway Management Planned: Natural Airway and Nasal Cannula  Additional Equipment:   Intra-op Plan:   Post-operative Plan:   Informed Consent: I have reviewed the patients History and Physical, chart, labs and discussed the procedure including the risks, benefits and alternatives for the proposed anesthesia with the  patient or authorized representative who has indicated his/her understanding and acceptance.     Dental Advisory Given  Plan Discussed with: Anesthesiologist, CRNA and Surgeon  Anesthesia Plan Comments:         Anesthesia Quick Evaluation

## 2019-03-05 ENCOUNTER — Encounter: Payer: Self-pay | Admitting: Internal Medicine

## 2019-03-05 LAB — SURGICAL PATHOLOGY

## 2019-03-17 NOTE — Progress Notes (Signed)
Cardiology Office Note    Date:  03/20/2019   ID:  Mechelle, Pates 01-Feb-1941, MRN [id number]  PCP:  Baxter Hire, MD  Cardiologist:  Nelva Bush, MD  Electrophysiologist:  None   Chief Complaint: Hospital follow-up  History of Present Illness:   Sarah Sweeney is a 78 y.o. female with history of recently diagnosed A. 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 fib with slow ventricular response.  She was diagnosed with unprovoked bilateral PE in 2013 and was remotely evaluated by outside cardiology group at that time without further follow-up.  She has been managed on chronic Coumadin in the setting of her bilateral PEs.  She underwent screening colonoscopy on the morning of 03/04/2019 and post procedure it was noted to be in new onset A. fib with slow ventricular response with ventricular rates in the 50s to 60s bpm.  At baseline, the patient did not take any rate limiting medications.  She was asymptomatic.  Labs obtained at that time showed a normal TSH, magnesium 2.2, potassium 4.1, serum creatinine 0.97.  Echo was performed on 03/19/2019 which showed low normal EF of 50 to 55%, mild to moderately dilated LV with severe asymmetric LVH of the apical wall consistent with apical hypertrophic cardiomyopathy, low normal RV systolic function, mildly enlarged RV cavity, mild biatrial enlargement, mild to moderate mitral regurgitation, tricuspid aortic valve, aorta normal in size and structure.  Patient comes in doing well from a cardiac perspective.  She has noted some increased fatigue with exertion since her colonoscopy.  She denies any chest pain, shortness of breath, dizziness, presyncope, syncope.  She has intermittently noted some palpitations that are short-lived  and last for several seconds to a minute or 2 with spontaneous resolution.  She denies any lower extremity swelling, abdominal distention, orthopnea, PND, early satiety.  No falls, BRBPR, or melena.  She has subsequently resumed warfarin and has follow-up INR with her PCPs office on 03/21/2019.  She remains on aspirin therapy as well.  She has not thought any further with regarding transitioning from warfarin to a DOAC.  She denies any prior history of syncope.   Past Medical History:  Diagnosis Date  . Allergy   . Diabetes mellitus without complication ([place])   . Erythrocytosis 03/02/2015  . GERD (gastroesophageal reflux disease)   . Headache   . Hypertension   . Pulmonary embolism (HCC)    Coumadin Therapy    Past Surgical History:  Procedure Laterality Date  . APPENDECTOMY    . COLONOSCOPY WITH PROPOFOL N/A 03/04/2019   Procedure: COLONOSCOPY WITH PROPOFOL;  Surgeon: Toledo, Benay Pike, MD;  Location: [facility] ENDOSCOPY;  Service: Gastroenterology;  Laterality: N/A;  . TONSILLECTOMY      Current Medications: Current Meds  Medication Sig  . aspirin EC 81 MG tablet Take by mouth.  . hydrochlorothiazide (HYDRODIURIL) 25 MG tablet Take 25 mg by mouth daily.   Marland Kitchen latanoprost (XALATAN) 0.005 % ophthalmic solution Apply 1 drop to eye at bedtime.   Marland Kitchen losartan (COZAAR) 50 MG tablet Take 50 mg by mouth daily.  Marland Kitchen omeprazole (PRILOSEC) 40 MG capsule Take 40 mg by mouth daily.   Marland Kitchen oxybutynin (DITROPAN-XL) 10 MG 24 hr tablet Take 10 mg by mouth at bedtime.  Marland Kitchen warfarin (COUMADIN) 1 MG tablet Take 1 mg  by mouth. MWF- 3 mg, all other days of week - 2 mg    Allergies:   Patient has no known allergies.   Social History   Socioeconomic History  . Marital status: Single    Spouse name: Not on file  . Number of children: Not on file  . Years of education: Not on file  . Highest education level: Not on file  Occupational History  . Not on file  Social Needs  . Financial resource strain: Not on file   . Food insecurity    Worry: Not on file    Inability: Not on file  . Transportation needs    Medical: Not on file    Non-medical: Not on file  Tobacco Use  . Smoking status: Never Smoker  . Smokeless tobacco: Never Used  Substance and Sexual Activity  . Alcohol use: No  . Drug use: No  . Sexual activity: Not Currently  Lifestyle  . Physical activity    Days per week: Not on file    Minutes per session: Not on file  . Stress: Not on file  Relationships  . Social Musicianconnections    Talks on phone: Not on file    Gets together: Not on file    Attends religious service: Not on file    Active member of club or organization: Not on file    Attends meetings of clubs or organizations: Not on file    Relationship status: Not on file  Other Topics Concern  . Not on file  Social History Narrative  . Not on file     Family History:  The patient's family history includes Breast cancer in her maternal aunt; Cancer in her maternal uncle; Stroke in her maternal grandfather.  ROS:   Review of Systems  Constitutional: Positive for malaise/fatigue. Negative for chills, diaphoresis, fever and weight loss.  HENT: Negative for congestion.   Eyes: Negative for discharge and redness.  Respiratory: Negative for cough, hemoptysis, sputum production, shortness of breath and wheezing.   Cardiovascular: Positive for palpitations. Negative for chest pain, orthopnea, claudication, leg swelling and PND.  Gastrointestinal: Negative for abdominal pain, blood in stool, heartburn, melena, nausea and vomiting.  Genitourinary: Negative for hematuria.  Musculoskeletal: Negative for falls and myalgias.  Skin: Negative for rash.  Neurological: Positive for weakness. Negative for dizziness, tingling, tremors, sensory change, speech change, focal weakness and loss of consciousness.  Endo/Heme/Allergies: Does not bruise/bleed easily.  Psychiatric/Behavioral: Negative for substance abuse. The patient is not  nervous/anxious.   All other systems reviewed and are negative.    EKGs/Labs/Other Studies Reviewed:    Studies reviewed were summarized above. The additional studies were reviewed today:  2D echo 03/19/2019: 1. The left ventricle has low normal systolic function, with an ejection fraction of 50-55%. The cavity size was mild to moderately dilated. There is severe asymmetric left ventricular hypertrophy of the apical wall. Findings are consistent with apical  hypertrophic cardiomyopathy. Left ventricular diastolic function could not be evaluated secondary to atrial fibrillation.  2. The right ventricle has low normal systolic function. The cavity was mildly enlarged. There is no increase in right ventricular wall thickness. Right ventricular systolic pressure could not be assessed.  3. Left atrial size was mildly dilated.  4. Right atrial size was mildly dilated.  5. Mitral valve regurgitation is mild to moderate by color flow Doppler.  6. The tricuspid valve is grossly normal.  7. The aortic valve is tricuspid.  8. The aorta is  normal in size and structure.  9. The interatrial septum was not well visualized.   EKG:  EKG is ordered today.  The EKG ordered today demonstrates A. fib with slow ventricular response, 59 bpm, LVH, anterior lateral T wave inversion (unchanged from prior)  Recent Labs: 03/04/2019: BUN 14; Creatinine, Ser 0.97; Magnesium 2.2; Potassium 4.1; Sodium 137; TSH 3.890  Recent Lipid Panel No results found for: CHOL, TRIG, HDL, CHOLHDL, VLDL, LDLCALC, LDLDIRECT  PHYSICAL EXAM:    VS:  BP 138/68 (BP Location: Left Arm, Patient Position: Sitting, Cuff Size: Normal)   Pulse (!) 59   Ht 5\' 5"  (1.651 m)   Wt 166 lb 12.8 oz (75.7 kg)   SpO2 98%   BMI 27.76 kg/m   BMI: Body mass index is 27.76 kg/m.  Physical Exam  Constitutional: She is oriented to person, place, and time. She appears well-developed and well-nourished.  HENT:  Head: Normocephalic and atraumatic.   Eyes: Right eye exhibits no discharge. Left eye exhibits no discharge.  Neck: Normal range of motion. No JVD present.  Cardiovascular: S1 normal and S2 normal. An irregularly irregular rhythm present. Bradycardia present. Exam reveals no distant heart sounds, no friction rub, no midsystolic click and no opening snap.  Murmur heard. Pulses:      Posterior tibial pulses are 2+ on the right side and 2+ on the left side.  1/6 systolic murmur heard throughout  Pulmonary/Chest: Effort normal and breath sounds normal. No respiratory distress. She has no decreased breath sounds. She has no wheezes. She has no rales. She exhibits no tenderness.  Abdominal: Soft. She exhibits no distension. There is no abdominal tenderness.  Musculoskeletal:        General: No edema.  Neurological: She is alert and oriented to person, place, and time.  Skin: Skin is warm and dry. No cyanosis. Nails show no clubbing.  Psychiatric: She has a normal mood and affect. Her speech is normal and behavior is normal. Judgment and thought content normal.    Wt Readings from Last 3 Encounters:  03/20/19 166 lb 12.8 oz (75.7 kg)  03/04/19 164 lb (74.4 kg)  12/12/17 171 lb (77.6 kg)     ASSESSMENT & PLAN:   1. Persistent A. fib: Newly diagnosed following colonoscopy in late 02/2019.  She remains in A. fib with well-controlled to slightly bradycardic ventricular response.  Not currently requiring any medications for rate control.  In the setting of a ventricular rate of 59 bpm on twelve-lead EKG today we will continue to defer rate control medications at this time.  However, given patient's potential apical hypertrophic cardiomyopathy noted on recent echo would add calcium channel blocker such as verapamil if needed.  Discussed with patient potential transitioning from Coumadin to DOAC in detail today.  She is hesitant to do so and prefers to remain on Coumadin.  In light of the patient's recently diagnosed A. fib I do think it would  be best if the patient were to have her INR was monitored through our Coumadin clinic and referral has been made for the patient to establish with them next week.  She will keep her previously scheduled INR through her PCPs office on 8/7.  We will plan for cardioversion once the patient has had a therapeutic INR of 2.0-3.0 without interruption for least the next 3 to 4 weeks.  Once her INR is therapeutic I recommend discontinuation of aspirin.  2. Abnormal EKG: No symptoms of chest pain.  Possibly in the setting of  the patient apical hypertrophic cardiomyopathy noted on echo.  Schedule Lexiscan Myoview to evaluate for high risk ischemia.  Patient currently on aspirin and Coumadin as above though we will plan for discontinuation of aspirin once INR is therapeutic as outlined above.  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  No prior history of syncope.  No noted ventricular ectopy.  Schedule cardiac MRI.  Recommend discontinuation of HCTZ with transition to alternative antihypertensive when patient follows up with PCP.  Optimal heart rate control is recommended with addition of verapamil if needed for ventricular rate control, though given patient's underlying bradycardic rates this has been deferred at this time.  4. Hypertension: Reasonably controlled.  Recommend transition from HCTZ to alternative hypertensive as outlined above when she follows up with PCP.  Remains on ARB.  5. HFpEF: Remains on longstanding Coumadin therapy which has previously been monitored by PCP.  However, given patient's recent diagnosis of A. fib it is likely best for the patient to transition to our Coumadin clinic as long as this is okay with the patient's PCP.  Disposition: F/u with Dr. Okey DupreEnd in 4 weeks.   Medication Adjustments/Labs and Tests Ordered: Current medicines are reviewed at length with the patient today.  Concerns regarding medicines are outlined above. Medication changes, Labs and Tests  ordered today are summarized above and listed in the Patient Instructions accessible in Encounters.   Signed, Eula Listenyan Ashtynn Berke, PA-C 03/20/2019 3:49 PM     [facility] [address] [place], [place] [address] [phone]

## 2019-03-19 ENCOUNTER — Ambulatory Visit (INDEPENDENT_AMBULATORY_CARE_PROVIDER_SITE_OTHER): Payer: Medicare Other

## 2019-03-19 ENCOUNTER — Other Ambulatory Visit: Payer: Self-pay

## 2019-03-19 DIAGNOSIS — I4891 Unspecified atrial fibrillation: Secondary | ICD-10-CM | POA: Diagnosis not present

## 2019-03-19 MED ORDER — PERFLUTREN LIPID MICROSPHERE
1.0000 mL | INTRAVENOUS | Status: AC | PRN
  Administered 2019-03-19: 2 mL via INTRAVENOUS

## 2019-03-20 ENCOUNTER — Encounter: Payer: Self-pay | Admitting: Physician Assistant

## 2019-03-20 ENCOUNTER — Ambulatory Visit (INDEPENDENT_AMBULATORY_CARE_PROVIDER_SITE_OTHER): Payer: Medicare Other | Admitting: Physician Assistant

## 2019-03-20 VITALS — BP 138/68 | HR 59 | Ht 65.0 in | Wt 166.8 lb

## 2019-03-20 DIAGNOSIS — Z86711 Personal history of pulmonary embolism: Secondary | ICD-10-CM

## 2019-03-20 DIAGNOSIS — I422 Other hypertrophic cardiomyopathy: Secondary | ICD-10-CM | POA: Diagnosis not present

## 2019-03-20 DIAGNOSIS — I10 Essential (primary) hypertension: Secondary | ICD-10-CM | POA: Diagnosis not present

## 2019-03-20 DIAGNOSIS — I4819 Other persistent atrial fibrillation: Secondary | ICD-10-CM

## 2019-03-20 DIAGNOSIS — R9431 Abnormal electrocardiogram [ECG] [EKG]: Secondary | ICD-10-CM | POA: Diagnosis not present

## 2019-03-20 NOTE — Patient Instructions (Signed)
Medication Instructions:   Your physician recommends that you continue on your current medications as directed. Please refer to the Current Medication list given to you today.  If you need a refill on your cardiac medications before your next appointment, please call your pharmacy.   Lab work: Your physician recommends that you return for lab work 1 week prior to MRI at the medical mall. No appt is needed. Hours are M-F 7AM- 6 PM.  If you have labs (blood work) drawn today and your tests are completely normal, you will receive your results only by: Marland Kitchen. MyChart Message (if you have MyChart) OR . A paper copy in the mail If you have any lab test that is abnormal or we need to change your treatment, we will call you to review the results.  Testing/Procedures: 1- [facility] MYOVIEW  Your caregiver has ordered a Stress Test with nuclear imaging. The purpose of this test is to evaluate the blood supply to your heart muscle. This procedure is referred to as a "Non-Invasive Stress Test." This is because other than having an IV started in your vein, nothing is inserted or "invades" your body. Cardiac stress tests are done to find areas of poor blood flow to the heart by determining the extent of coronary artery disease (CAD). Some patients exercise on a treadmill, which naturally increases the blood flow to your heart, while others who are  unable to walk on a treadmill due to physical limitations have a pharmacologic/chemical stress agent called Lexiscan . This medicine will mimic walking on a treadmill by temporarily increasing your coronary blood flow.   Please note: these test may take anywhere between 2-4 hours to complete  PLEASE REPORT TO [facility]  THE VOLUNTEERS AT THE FIRST DESK WILL DIRECT YOU WHERE TO GO  Date of Procedure:_____________________________________  Arrival Time for Procedure:______________________________  Instructions regarding medication:   _x____:  Hold other  medications as follows:____HCTZ_____________________________________________________________________________________________________________  PLEASE NOTIFY THE OFFICE AT LEAST 24 HOURS IN ADVANCE IF YOU ARE UNABLE TO KEEP YOUR APPOINTMENT.  [phone] AND  PLEASE NOTIFY NUCLEAR MEDICINE AT [facility] AT LEAST 24 HOURS IN ADVANCE IF YOU ARE UNABLE TO KEEP YOUR APPOINTMENT. [phone]  How to prepare for your Myoview test:  1. Do not eat or drink after midnight 2. No caffeine for 24 hours prior to test 3. No smoking 24 hours prior to test. 4. Your medication may be taken with water.  If your doctor stopped a medication because of this test, do not take that medication. 5. Ladies, please do not wear dresses.  Skirts or pants are appropriate. Please wear a short sleeve shirt. 6. No perfume, cologne or lotion. 7. Wear comfortable walking shoes. No heels!          2- Cardiac MRI You are scheduled for Cardiac MRI on ______________. [facility] at ________________ (30-45 minutes prior to test start time). ?  [facility]  [address]  [place], [place] [address]  [phone]  Proceed to the [facility] Radiology Department (First Floor).  ?  Magnetic resonance imaging (MRI) is a painless test that produces images of the inside of the body without using X-rays. During an MRI, strong magnets and radio waves work together in a Data processing managermagnetic field to form detailed images. MRI images may provide more details about a medical condition than X-rays, CT scans, and ultrasounds can provide.  You may be given earphones  to listen for instructions.  You may eat a light breakfast and take medications as ordered with the exception of HCTZ (fluid pill, other). If a contrast material will be used, an IV will be inserted into one of your veins. Contrast material will be injected into your IV.  You will be asked to remove all metal, including:  Watch, jewelry, and other metal objects including hearing aids, hair pieces and dentures. (Braces and fillings normally are not a problem.)  If contrast material was used:  It will leave your body through your urine within a day. You may be told to drink plenty of fluids to help flush the contrast material out of your system.  TEST WILL TAKE APPROXIMATELY 1 HOUR  PLEASE NOTIFY SCHEDULING AT LEAST 24 HOURS IN ADVANCE IF YOU ARE UNABLE TO KEEP YOUR APPOINTMENT. [phone]    Follow-Up: At [facility], you and your health needs are our priority.  As part of our continuing mission to provide you with exceptional heart care, we have created designated Provider Care Teams.  These Care Teams include your primary Cardiologist (physician) and Advanced Practice Providers (APPs -  Physician Assistants and Nurse Practitioners) who all work together to provide you with the care you need, when you need it. . You will need a follow up appointment in 4 weeks.   You may see Nelva Bush, MD or Christell Faith, PA-C.  Any Other Special Instructions Will Be Listed Below (If Applicable). Please make an appt with [facility] next week at the coumadin clinic.

## 2019-03-25 ENCOUNTER — Encounter: Payer: Self-pay | Admitting: Physician Assistant

## 2019-03-28 ENCOUNTER — Ambulatory Visit
Admission: RE | Admit: 2019-03-28 | Discharge: 2019-03-28 | Disposition: A | Discharge status: Home / Self Care | Payer: Medicare Other | Source: Ambulatory Visit | Attending: Physician Assistant | Admitting: Physician Assistant

## 2019-03-28 ENCOUNTER — Other Ambulatory Visit: Payer: Self-pay

## 2019-03-28 DIAGNOSIS — R9431 Abnormal electrocardiogram [ECG] [EKG]: Secondary | ICD-10-CM | POA: Diagnosis not present

## 2019-03-28 LAB — NM MYOCAR MULTI W/SPECT W/WALL MOTION / EF
LV dias vol: 40 mL (ref 46–106)
LV sys vol: 16 mL
Peak HR: 79 {beats}/min
Percent HR: 55 %
Rest HR: 54 {beats}/min
SDS: 0
SRS: 20
SSS: 3
TID: 0.77

## 2019-03-28 MED ORDER — TECHNETIUM TC 99M TETROFOSMIN IV KIT
10.5900 | PACK | Freq: Once | INTRAVENOUS | Status: AC | PRN
  Administered 2019-03-28: 09:00:00 10.59 via INTRAVENOUS

## 2019-03-28 MED ORDER — REGADENOSON 0.4 MG/5ML IV SOLN
0.4000 mg | Freq: Once | INTRAVENOUS | Status: AC
  Administered 2019-03-28: 0.4 mg via INTRAVENOUS

## 2019-03-28 MED ORDER — TECHNETIUM TC 99M TETROFOSMIN IV KIT
32.3800 | PACK | Freq: Once | INTRAVENOUS | Status: AC | PRN
  Administered 2019-03-28: 32.38 via INTRAVENOUS

## 2019-03-31 ENCOUNTER — Other Ambulatory Visit: Payer: Self-pay

## 2019-03-31 ENCOUNTER — Telehealth: Payer: Self-pay | Admitting: Physician Assistant

## 2019-03-31 ENCOUNTER — Ambulatory Visit (INDEPENDENT_AMBULATORY_CARE_PROVIDER_SITE_OTHER): Payer: Medicare Other

## 2019-03-31 DIAGNOSIS — Z5181 Encounter for therapeutic drug level monitoring: Secondary | ICD-10-CM

## 2019-03-31 DIAGNOSIS — I4819 Other persistent atrial fibrillation: Secondary | ICD-10-CM

## 2019-03-31 DIAGNOSIS — I4891 Unspecified atrial fibrillation: Secondary | ICD-10-CM | POA: Diagnosis not present

## 2019-03-31 LAB — POCT INR: INR: 2.1 (ref 2.0–3.0)

## 2019-03-31 MED ORDER — ATORVASTATIN CALCIUM 20 MG PO TABS: 20.0000 mg | ORAL_TABLET | Freq: Every day | ORAL | 3 refills | Status: DC

## 2019-03-31 NOTE — Telephone Encounter (Signed)
Notes recorded by Rise Mu, PA-C on 03/29/2019 at 3:04 PM EDT  Stress test showed no significant ischemia. EF 49% likely reduced secondary to GI uptake artifact. Recent echo showed low normal pump function. CT images did show mild coronary artery calcification and aortic atherosclerosis. Overall, this was a low risk scan. Recent LDL of 115 from 12/2018. If she is agreeable, I recommend starting Lipitor 20 mg daily with a recheck lipid and liver function in 8 weeks.

## 2019-03-31 NOTE — Telephone Encounter (Signed)
I spoke with the patient. She is aware of her results and voices understanding.  She is agreeable with starting atorvastatin 20 mg once daily.   She has a follow up appt on 04/24/19 with Thurmond Butts, [place]- she is aware we will readdress her lipid/ liver profile at that time.

## 2019-03-31 NOTE — Patient Instructions (Addendum)
Please continue current dosage of 2 tablets every day. Recheck INR in 4 weeks.

## 2019-04-14 ENCOUNTER — Telehealth (HOSPITAL_COMMUNITY): Payer: Self-pay | Admitting: Emergency Medicine

## 2019-04-14 NOTE — Telephone Encounter (Signed)
Reaching out to patient to offer assistance regarding upcoming cardiac imaging study; pt verbalizes understanding of appt date/time, parking situation and where to check in, and verified current allergies; name and call back number provided for further questions should they arise Sarah Sedivy RN Navigator Cardiac Imaging [facility] [phone] office [phone] cell 

## 2019-04-15 ENCOUNTER — Other Ambulatory Visit: Payer: Self-pay

## 2019-04-15 ENCOUNTER — Ambulatory Visit (HOSPITAL_COMMUNITY)
Admission: RE | Admit: 2019-04-15 | Discharge: 2019-04-15 | Disposition: A | Discharge status: Home / Self Care | Payer: Medicare Other | Source: Ambulatory Visit | Attending: Physician Assistant | Admitting: Physician Assistant

## 2019-04-15 DIAGNOSIS — I422 Other hypertrophic cardiomyopathy: Secondary | ICD-10-CM

## 2019-04-15 MED ORDER — GADOBUTROL 1 MMOL/ML IV SOLN
10.0000 mL | Freq: Once | INTRAVENOUS | Status: AC | PRN
  Administered 2019-04-15: 14:00:00 10 mL via INTRAVENOUS

## 2019-04-16 ENCOUNTER — Telehealth: Payer: Self-pay

## 2019-04-16 ENCOUNTER — Other Ambulatory Visit: Payer: Self-pay | Admitting: Physician Assistant

## 2019-04-16 DIAGNOSIS — I422 Other hypertrophic cardiomyopathy: Secondary | ICD-10-CM

## 2019-04-16 NOTE — Telephone Encounter (Signed)
Patient made aware of cardiac MRI results and Christell Faith, PA recommendation with verbalized understanding.

## 2019-04-16 NOTE — Telephone Encounter (Signed)
-----   Message from Rise Mu, [place] sent at 04/16/2019  3:10 PM EDT ----- I believe I already placed a result note on this MRI earlier this morning, though the study came back to my in basket later in the day without a result note attached. Can you please check the result note pool to see if it is there to prevent the patient from being called twice?  MRI showed thickening of the heart consistent with her prior echo along with a mildly leaky mitral valve. Keep her follow up as scheduled for further discussion.

## 2019-04-21 NOTE — Progress Notes (Signed)
Cardiology Office Note    Date:  04/24/2019   ID:  Sarah Sweeney, DOB April 01, 1941, MRN [id number]  PCP:  Mick SellFitzgerald, David P, MD  Cardiologist:  Yvonne Kendallhristopher End, MD  Electrophysiologist:  None   Chief Complaint: Follow up  History of Present Illness:   Sarah Sweeney is a 78 y.o. female with history of recently diagnosed A. 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  She was diagnosed with unprovoked bilateral PE in 2013 and was remotely evaluated by outside cardiology group at that time without further follow-up. She has been managed on chronic Coumadin in the setting of her bilateral PEs. She underwent screening colonoscopy on the morning of 03/04/2019 and post procedure it was noted to be in new onset A. fib with slow ventricular response with ventricular rates in the 50s to 60s bpm.  At baseline, the patient did not take any rate limiting medications.  She was asymptomatic.  Labs obtained at that time showed a normal TSH, magnesium 2.2, potassium 4.1, serum creatinine 0.97.  Echo was performed on 03/19/2019 which showed low normal EF of 50 to 55%, mild to moderately dilated LV with severe asymmetric LVH of the apical wall consistent with apical hypertrophic cardiomyopathy, low normal RV systolic function, mildly enlarged RV cavity, mild biatrial enlargement, mild to moderate mitral regurgitation, tricuspid aortic valve, aorta normal in size and structure.  She was seen in follow up on 03/20/2019 and was doing well with some fatigue and intermittent palpitations. She was not interested in transitioning from Coumadin to DOAC. She underwent Lexiscan Myoview on 03/28/2019 that showed no significant ischemia, small region of fixed mild defect in the mid anteroseptal wall consistent with breast attenuation  artifact, normal wall motion with an EF of 49% (felt to be depressed secondary to GI uptake artifact), EKG with Afib, and CT images showing mild coronary artery calcification and mild aortic arch atherosclerosis. Overall, this was a low risk scan. She underwent cardiac MRI on 04/15/2019 that showed hypertrophy of the LV apex consistent with apical hypertropic cardiomyopathy with late gadolinium enhancement at the inferior RV insertion site and patchy LGE at the apex, consistent with apical HCM, normal LV/RV systolic function, small to moderate circumferential pericardial effusion, and mild MR.   She comes in doing very well today.  She denies any chest pain, shortness of breath, palpitations, dizziness, presyncope, or syncope.  No lower extremity swelling, abdominal tension, orthopnea, PND, early satiety.  No falls, BRBPR, or melena.  With her therapeutic INR she has subsequently discontinued aspirin.  She prefers to avoid any procedure if possible.  Otherwise, she has no concerns today.   Labs: 03/31/2019 - INR 2.1 02/2019 - TSH normal, magnesium 2.2, potassium 4.1, SCr 0.97 12/2018 - HGB 14.6  Past Medical History:  Diagnosis Date   Allergy    Diabetes mellitus without complication (HCC)    Erythrocytosis 03/02/2015   GERD (gastroesophageal reflux disease)    Headache    Hypertension    Pulmonary embolism (HCC)    Coumadin Therapy    Past Surgical History:  Procedure Laterality Date   APPENDECTOMY     COLONOSCOPY WITH PROPOFOL N/A 03/04/2019   Procedure: COLONOSCOPY WITH PROPOFOL;  Surgeon: Toledo, Boykin Nearingeodoro K, MD;  Location: [facility] ENDOSCOPY;  Service: Gastroenterology;  Laterality: N/A;   TONSILLECTOMY      Current Medications: Current  Meds  Medication Sig   atorvastatin (LIPITOR) 20 MG tablet Take 1 tablet (20 mg total) by mouth daily.   latanoprost (XALATAN) 0.005 % ophthalmic solution Apply 1 drop to eye at bedtime.    losartan-hydrochlorothiazide (HYZAAR) 50-12.5 MG  tablet    omeprazole (PRILOSEC) 40 MG capsule Take 40 mg by mouth daily.    oxybutynin (DITROPAN-XL) 10 MG 24 hr tablet Take 10 mg by mouth at bedtime.   warfarin (COUMADIN) 1 MG tablet Take 1 mg by mouth. MWF- 3 mg, all other days of week - 2 mg    Allergies:   Patient has no known allergies.   Social History   Socioeconomic History   Marital status: Single    Spouse name: Not on file   Number of children: Not on file   Years of education: Not on file   Highest education level: Not on file  Occupational History   Not on file  Social Needs   Financial resource strain: Not on file   Food insecurity    Worry: Not on file    Inability: Not on file   Transportation needs    Medical: Not on file    Non-medical: Not on file  Tobacco Use   Smoking status: Never Smoker   Smokeless tobacco: Never Used  Substance and Sexual Activity   Alcohol use: No   Drug use: No   Sexual activity: Not Currently  Lifestyle   Physical activity    Days per week: Not on file    Minutes per session: Not on file   Stress: Not on file  Relationships   Social connections    Talks on phone: Not on file    Gets together: Not on file    Attends religious service: Not on file    Active member of club or organization: Not on file    Attends meetings of clubs or organizations: Not on file    Relationship status: Not on file  Other Topics Concern   Not on file  Social History Narrative   Not on file     Family History:  The patient's family history includes Breast cancer in her maternal aunt; Cancer in her maternal uncle; Stroke in her maternal grandfather.  ROS:   Review of Systems  Constitutional: Negative for chills, diaphoresis, fever, malaise/fatigue and weight loss.  HENT: Negative for congestion.   Eyes: Negative for discharge and redness.  Respiratory: Negative for cough, hemoptysis, sputum production, shortness of breath and wheezing.   Cardiovascular: Negative for  chest pain, palpitations, orthopnea, claudication, leg swelling and PND.  Gastrointestinal: Negative for abdominal pain, blood in stool, heartburn, melena, nausea and vomiting.  Genitourinary: Negative for hematuria.  Musculoskeletal: Negative for falls and myalgias.  Skin: Negative for rash.  Neurological: Negative for dizziness, tingling, tremors, sensory change, speech change, focal weakness, loss of consciousness and weakness.  Endo/Heme/Allergies: Does not bruise/bleed easily.  Psychiatric/Behavioral: Negative for substance abuse. The patient is not nervous/anxious.   All other systems reviewed and are negative.    EKGs/Labs/Other Studies Reviewed:    Studies reviewed were summarized above. The additional studies were reviewed today:  2D Echo 03/2019: 1. The left ventricle has low normal systolic function, with an ejection fraction of 50-55%. The cavity size was mild to moderately dilated. There is severe asymmetric left ventricular hypertrophy of the apical wall. Findings are consistent with apical  hypertrophic cardiomyopathy. Left ventricular diastolic function could not be evaluated secondary to atrial fibrillation.  2. The  right ventricle has low normal systolic function. The cavity was mildly enlarged. There is no increase in right ventricular wall thickness. Right ventricular systolic pressure could not be assessed.  3. Left atrial size was mildly dilated.  4. Right atrial size was mildly dilated.  5. Mitral valve regurgitation is mild to moderate by color flow Doppler.  6. The tricuspid valve is grossly normal.  7. The aortic valve is tricuspid.  8. The aorta is normal in size and structure.  9. The interatrial septum was not well visualized. __________  Myoview 03/2019: Pharmacological myocardial perfusion imaging study with no significant Ischemia Small region mild fixed defect mid anteroseptal wall, consistent with breast attenuation artifact  Normal wall motion, EF  estimated at 49% (depressed ejection fraction likely secondary to GI uptake artifact) No EKG changes concerning for ischemia at peak stress or in recovery. EKG with atrial fibrillation CT attenuation corrected images showing mild coronary calcification, mild aortic arch atherosclerosis Low risk scan __________  Cardiac MRI 04/2019: 1. Hypertrophy of LV apex consistent with apical hypertrophic cardiomyopathy 2. Late gadolinium enhancement at the inferior RV insertion site and patchy LGE at the apex. This is consistent with apical HCM 3.  Normal LV size and systolic function (EF 63%) 4.  Normal RV size and systolic function (EF 55%) 5. Small to moderate circumferential pericardial effusion, measuring up to 10mm adjacent to LV free wall 6.  Mild mitral regurgitation (regurgitant fraction 18%)   EKG:  EKG is ordered today.  The EKG ordered today demonstrates A. fib with slow ventricular response, 53 bpm, LVH, nonspecific lateral ST-T changes unchanged from prior  Recent Labs: 03/04/2019: BUN 14; Creatinine, Ser 0.97; Magnesium 2.2; Potassium 4.1; Sodium 137; TSH 3.890  Recent Lipid Panel No results found for: CHOL, TRIG, HDL, CHOLHDL, VLDL, LDLCALC, LDLDIRECT  PHYSICAL EXAM:    VS:  BP 124/78 (BP Location: Left Arm, Patient Position: Sitting, Cuff Size: Normal)    Pulse (!) 53    Ht 5\' 5"  (1.651 m)    Wt 166 lb (75.3 kg)    BMI 27.62 kg/m   BMI: Body mass index is 27.62 kg/m.  Physical Exam  Constitutional: She is oriented to person, place, and time. She appears well-developed and well-nourished.  HENT:  Head: Normocephalic and atraumatic.  Eyes: Right eye exhibits no discharge. Left eye exhibits no discharge.  Neck: Normal range of motion. No JVD present.  Cardiovascular: S1 normal and S2 normal. An irregularly irregular rhythm present. Bradycardia present. Exam reveals no distant heart sounds, no friction rub, no midsystolic click and no opening snap.  Murmur heard. High-pitched  blowing holosystolic murmur is present with a grade of 2/6 at the apex. Pulses:      Posterior tibial pulses are 2+ on the right side and 2+ on the left side.  Pulmonary/Chest: Effort normal and breath sounds normal. No respiratory distress. She has no decreased breath sounds. She has no wheezes. She has no rales. She exhibits no tenderness.  Abdominal: Soft. She exhibits no distension. There is no abdominal tenderness.  Musculoskeletal:        General: No edema.  Neurological: She is alert and oriented to person, place, and time.  Skin: Skin is warm and dry. No cyanosis. Nails show no clubbing.  Psychiatric: She has a normal mood and affect. Her speech is normal and behavior is normal. Judgment and thought content normal.    Wt Readings from Last 3 Encounters:  04/24/19 166 lb (75.3 kg)  03/20/19 166  lb 12.8 oz (75.7 kg)  03/04/19 164 lb (74.4 kg)     ASSESSMENT & PLAN:   1. Persistent A. Fib: She remains in A. fib with bradycardic ventricular response and is completely asymptomatic.  Given this, we have decided to pursue rate control strategy as the patient would like to avoid any procedure if possible.  She remains on anticoagulation with Coumadin as she prefers not to transition to DOAC with most recent INR being therapeutic as outlined above.  Follow-up with Coumadin clinic as directed.  No longer on aspirin in the setting of therapeutic INR.  Not currently requiring rate limiting medication.  2. Apical hypertrophic cardiomyopathy: Recent cardiac MRI showed mild apical hypertrophic cardiomyopathy.  Patient is completely asymptomatic and denies any symptoms of palpitations or syncope.  Case was discussed with Dr. Kirke Corin with recommendation to proceed with outpatient cardiac monitoring to evaluate for sustained ventricular arrhythmias.  If these are noted she will be referred to EP for further risk stratification and consideration of ICD for primary prevention.  At this time, it is  recommended this be deferred.  Ventricular rate remains optimized not on rate limiting medications.  3. HTN: Blood pressure is well controlled.  No changes in therapy.  4. Abnormal EKG: Unchanged.  No chest pain.  Likely in the setting of the patient's underlying hypertrophic cardiomyopathy.  Remains on Coumadin in place of aspirin.  Recent nonischemic Myoview as outlined above.  5. History of pulmonary embolism: Remains on indefinite Coumadin therapy.  Disposition: F/u with Dr. Okey Dupre or an APP in 6 months.   Medication Adjustments/Labs and Tests Ordered: Current medicines are reviewed at length with the patient today.  Concerns regarding medicines are outlined above. Medication changes, Labs and Tests ordered today are summarized above and listed in the Patient Instructions accessible in Encounters.   Signed, Eula Listen, PA-C 04/24/2019 11:47 AM     [facility] [address] [place], [place] [address] [phone]

## 2019-04-24 ENCOUNTER — Ambulatory Visit: Payer: Medicare Other | Admitting: Physician Assistant

## 2019-04-24 ENCOUNTER — Ambulatory Visit (INDEPENDENT_AMBULATORY_CARE_PROVIDER_SITE_OTHER): Payer: Medicare Other

## 2019-04-24 ENCOUNTER — Encounter: Payer: Self-pay | Admitting: Physician Assistant

## 2019-04-24 ENCOUNTER — Other Ambulatory Visit: Payer: Self-pay

## 2019-04-24 VITALS — BP 124/78 | HR 53 | Ht 65.0 in | Wt 166.0 lb

## 2019-04-24 DIAGNOSIS — I422 Other hypertrophic cardiomyopathy: Secondary | ICD-10-CM

## 2019-04-24 DIAGNOSIS — I4819 Other persistent atrial fibrillation: Secondary | ICD-10-CM

## 2019-04-24 DIAGNOSIS — Z86711 Personal history of pulmonary embolism: Secondary | ICD-10-CM

## 2019-04-24 DIAGNOSIS — I10 Essential (primary) hypertension: Secondary | ICD-10-CM | POA: Diagnosis not present

## 2019-04-24 DIAGNOSIS — R9431 Abnormal electrocardiogram [ECG] [EKG]: Secondary | ICD-10-CM

## 2019-04-24 NOTE — Patient Instructions (Signed)
Medication Instructions:  STOP Aspirin If you need a refill on your cardiac medications before your next appointment, please call your pharmacy.   Lab work: None ordered  If you have labs (blood work) drawn today and your tests are completely normal, you will receive your results only by: Marland Kitchen MyChart Message (if you have MyChart) OR . A paper copy in the mail If you have any lab test that is abnormal or we need to change your treatment, we will call you to review the results.  Testing/Procedures: 1- A zio monitor was placed today. It will remain on for 14 days. You will then return monitor and event diary in provided box. It takes 1-2 weeks for report to be downloaded and returned to [place]. We will call you with the results. If monitor falls of or has orange flashing light, please call Zio for further instructions.    Follow-Up: At [facility], you and your health needs are our priority.  As part of our continuing mission to provide you with exceptional heart care, we have created designated Provider Care Teams.  These Care Teams include your primary Cardiologist (physician) and Advanced Practice Providers (APPs -  Physician Assistants and Nurse Practitioners) who all work together to provide you with the care you need, when you need it. You will need a follow up appointment in 6 months.  Please call our office 2 months in advance to schedule this appointment.  You may see Nelva Bush, MD or Christell Faith, PA-C.

## 2019-04-26 ENCOUNTER — Other Ambulatory Visit: Payer: Self-pay | Admitting: Physician Assistant

## 2019-04-28 ENCOUNTER — Ambulatory Visit (INDEPENDENT_AMBULATORY_CARE_PROVIDER_SITE_OTHER): Payer: Medicare Other

## 2019-04-28 ENCOUNTER — Other Ambulatory Visit: Payer: Self-pay

## 2019-04-28 DIAGNOSIS — I4819 Other persistent atrial fibrillation: Secondary | ICD-10-CM | POA: Diagnosis not present

## 2019-04-28 DIAGNOSIS — I4891 Unspecified atrial fibrillation: Secondary | ICD-10-CM | POA: Diagnosis not present

## 2019-04-28 DIAGNOSIS — Z5181 Encounter for therapeutic drug level monitoring: Secondary | ICD-10-CM

## 2019-04-28 LAB — POCT INR: INR: 2.4 (ref 2.0–3.0)

## 2019-04-28 NOTE — Patient Instructions (Signed)
Please continue current dosage of 2 tablets every day. Recheck INR in 5 weeks.

## 2019-05-13 ENCOUNTER — Telehealth: Payer: Self-pay | Admitting: Physician Assistant

## 2019-05-13 NOTE — Telephone Encounter (Signed)
Returned call to [employer]hythm for critical result of zio XT monitor.  A fib noted 100 % of time. Slow a fib- 34 bpm, lasting 1 min 25 pauses, longest 3.3 sec 3 episodes of VT  Printed monitor for DOD to view.   Call to patient to check on how she has been doing since last OV. No chest pain, dizziness or palpitations. No fatigue.   Pt reports SOB when going up stairs. Nothing otherwise and recuperates quickly.   Routing call to provider to further advise.

## 2019-05-13 NOTE — Telephone Encounter (Signed)
Returned call to patient after advice given by Dr. Rockey Situ.  Nothing to do at this time. Uploaded for Dr. Saunders Revel to review and advise.   Pt was appreciative of call. Advised to call back if she has any further needs and we will call back when results are reviewed and POC ready.

## 2019-05-13 NOTE — Telephone Encounter (Signed)
Printed Final ZIO report showed to Dr Rockey Situ, DOD. He reviewed and it appears that most pauses happened during the middle of the night. Questions whether patient has sleep apnea.  Due to no patient reported symptoms, feels patient is stable at this time.   Final report uploaded to ordering doctors basket for review.  Patient currently does not have any follow up scheduled.

## 2019-05-13 NOTE — Telephone Encounter (Signed)
°  [employer] calling in with abnormal results. Please call [phone] to follow up

## 2019-05-14 NOTE — Telephone Encounter (Signed)
Please review the uploaded tracing, as there is no atrial fibrillation observed and the DOB does not match the patient in question.  Thanks.  Nelva Bush, MD [facility] Pager: [phone]

## 2019-05-15 NOTE — Telephone Encounter (Signed)
Correct monitor report printed out and given to Dr End to review.

## 2019-05-22 ENCOUNTER — Telehealth: Payer: Self-pay

## 2019-05-22 DIAGNOSIS — I4819 Other persistent atrial fibrillation: Secondary | ICD-10-CM

## 2019-05-22 NOTE — Telephone Encounter (Signed)
-----   Message from Rise Mu, PA-C sent at 05/22/2019  2:11 PM EDT ----- Cardiac monitor showed a predominant rhythm of Afib with 100% burden with an average ventricular rate of 55 bpm (range 29-146 bpm).  Rare PVCs (extra beats from the bottom of the heart) were noted. 3 episodes of NSVT were noted with the longest interval lasting 5 beats.  Occasional pauses were noted up to 3.3 seconds (in Afib), predominantly overnight.   Recommendations: -She is completely asymptomatic with her Afib and overall has very good rate control. No plans for rhythm control at this time given her prior wishes and in the setting of lack of symptoms.  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

## 2019-05-22 NOTE — Telephone Encounter (Signed)
ZIO monitor report for patient uploaded today. Routing to Dr End to make him aware.

## 2019-05-22 NOTE — Telephone Encounter (Signed)
Attempted to call patient. LMTCB 05/22/2019

## 2019-05-22 NOTE — Telephone Encounter (Signed)
Patient returning call.

## 2019-05-23 NOTE — Telephone Encounter (Signed)
Call to patient to discuss results of zio and POC from Christell Faith, [place].  Order placed for referral to EP.   Sleep study ref not placed at this time. I will need to consult with management and then place order.   All questions answered.   Advised pt to call for any further questions or concerns.

## 2019-05-26 NOTE — Telephone Encounter (Signed)
Report completed 

## 2019-06-02 ENCOUNTER — Other Ambulatory Visit: Payer: Self-pay

## 2019-06-02 ENCOUNTER — Ambulatory Visit (INDEPENDENT_AMBULATORY_CARE_PROVIDER_SITE_OTHER): Payer: Medicare Other

## 2019-06-02 DIAGNOSIS — I4891 Unspecified atrial fibrillation: Secondary | ICD-10-CM

## 2019-06-02 DIAGNOSIS — Z5181 Encounter for therapeutic drug level monitoring: Secondary | ICD-10-CM | POA: Diagnosis not present

## 2019-06-02 DIAGNOSIS — I4819 Other persistent atrial fibrillation: Secondary | ICD-10-CM

## 2019-06-02 LAB — POCT INR: INR: 2.1 (ref 2.0–3.0)

## 2019-06-02 MED ORDER — WARFARIN SODIUM 1 MG PO TABS: 1.0000 mg | ORAL_TABLET | Freq: Every day | ORAL | 1 refills | Status: DC

## 2019-06-02 NOTE — Patient Instructions (Signed)
Please continue current dosage of 2 tablets every day. Recheck INR in 6 weeks.

## 2019-06-05 ENCOUNTER — Other Ambulatory Visit: Payer: Self-pay

## 2019-06-05 ENCOUNTER — Ambulatory Visit (INDEPENDENT_AMBULATORY_CARE_PROVIDER_SITE_OTHER): Payer: Medicare Other | Admitting: Internal Medicine

## 2019-06-05 ENCOUNTER — Encounter: Payer: Self-pay | Admitting: Internal Medicine

## 2019-06-05 VITALS — BP 120/72 | HR 46 | Ht 65.0 in | Wt 165.2 lb

## 2019-06-05 DIAGNOSIS — Z01812 Encounter for preprocedural laboratory examination: Secondary | ICD-10-CM | POA: Diagnosis not present

## 2019-06-05 DIAGNOSIS — I4819 Other persistent atrial fibrillation: Secondary | ICD-10-CM

## 2019-06-05 DIAGNOSIS — I422 Other hypertrophic cardiomyopathy: Secondary | ICD-10-CM

## 2019-06-05 NOTE — Patient Instructions (Addendum)
Medication Instructions:  - Your physician recommends that you continue on your current medications as directed. Please refer to the Current Medication list given to you today.  *If you need a refill on your cardiac medications before your next appointment, please call your pharmacy*  Lab Work: - Pre procedure COVID swab: Friday 10/23 (12:30 pm- 2:30 pm) - [facility]: Drive up test, please do not get out of your car, they will come out to swab you   - Lab work: today- BMP/ CBC/ INR   If you have labs (blood work) drawn today and your tests are completely normal, you will receive your results only by: Marland Kitchen MyChart Message (if you have MyChart) OR . A paper copy in the mail If you have any lab test that is abnormal or we need to change your treatment, we will call you to review the results.  Testing/Procedures: - Your physician has recommended that you have a Cardioversion (DCCV). Electrical Cardioversion uses a jolt of electricity to your heart either through paddles or wired patches attached to your chest. This is a controlled, usually prescheduled, procedure. Defibrillation is done under light anesthesia in the hospital, and you usually go home the day of the procedure. This is done to get your heart back into a normal rhythm. You are not awake for the procedure. Please see the instruction sheet given to you today.   - You are scheduled for a Cardioversion on Wednesday 06/11/19 with Dr. Kate Sable  Please arrive at the [facility] at 6:30 am on the day of your procedure.  DIET INSTRUCTIONS:  Nothing to eat or drink after midnight the night before your procedure.         1) Labs: as above  2) Medications:  You may take all of your regular medications with a small amount of water the morning.  3) Must have a responsible person to drive you home.  4) Bring a current list of your medications and current insurance cards.    If you have any questions after  you get home, please call the office at [phone]. Alvis Lemmings, RN, BSN      - Your physician has requested that you have an exercise tolerance test.  - Thursday 07/03/19 at 8:00 am (arrive at 7:45 am)  - Pre-test COVID swab- Wednesday 07/02/19 (8:30 am- 10:30 am)  [facility] as before  - you may eat a light breakfast/ lunch prior to your procedure - no caffeine for 24 hours prior to your test (coffee, tea, soft drinks, or chocolate)  - no smoking/ vaping for 4 hours prior to your test - you may take your regular medications the day of your test. - bring any inhalers with you to your test - wear comfortable clothing & tennis/ non-skid shoes to walk on the treadmill   Follow-Up: At [facility], you and your health needs are our priority.  As part of our continuing mission to provide you with exceptional heart care, we have created designated Provider Care Teams.  These Care Teams include your primary Cardiologist (physician) and Advanced Practice Providers (APPs -  Physician Assistants and Nurse Practitioners) who all work together to provide you with the care you need, when you need it.  Your next appointment:   pending the results of your treadmill test  The format for your next appointment:   n/a  Provider:   n/a  Other Instructions - Alternate blood thinner medications: 1) Eliquis- 1  tablet twice daily 2) Xarelto- 1 tablet once daily 3) Pradaxa- 1 tablet twice daily   Electrical Cardioversion  Electrical cardioversion is the delivery of a jolt of electricity to restore a normal rhythm to the heart. A rhythm that is too fast or is not regular keeps the heart from pumping well. In this procedure, sticky patches or metal paddles are placed on the chest to deliver electricity to the heart from a device. This procedure may be done in an emergency if:  There is low or no blood pressure as a result of the heart rhythm.  Normal rhythm must be restored as fast  as possible to protect the brain and heart from further damage.  It may save a life. This procedure may also be done for irregular or fast heart rhythms that are not immediately life-threatening. Tell a health care provider about:  Any allergies you have.  All medicines you are taking, including vitamins, herbs, eye drops, creams, and over-the-counter medicines.  Any problems you or family members have had with anesthetic medicines.  Any blood disorders you have.  Any surgeries you have had.  Any medical conditions you have.  Whether you are pregnant or may be pregnant. What are the risks? Generally, this is a safe procedure. However, problems may occur, including:  Allergic reactions to medicines.  A blood clot that breaks free and travels to other parts of your body.  The possible return of an abnormal heart rhythm within hours or days after the procedure.  Your heart stopping (cardiac arrest). This is rare. What happens before the procedure? Medicines  Your health care provider may have you start taking: ? Blood-thinning medicines (anticoagulants) so your blood does not clot as easily. ? Medicines may be given to help stabilize your heart rate and rhythm.  Ask your health care provider about changing or stopping your regular medicines. This is especially important if you are taking diabetes medicines or blood thinners. General instructions  Plan to have someone take you home from the hospital or clinic.  If you will be going home right after the procedure, plan to have someone with you for 24 hours.  Follow instructions from your health care provider about eating or drinking restrictions. What happens during the procedure?  To lower your risk of infection: ? Your health care team will wash or sanitize their hands. ? Your skin will be washed with soap.  An IV tube will be inserted into one of your veins.  You will be given a medicine to help you relax  (sedative).  Sticky patches (electrodes) or metal paddles may be placed on your chest.  An electrical shock will be delivered. The procedure may vary among health care providers and hospitals. What happens after the procedure?   Your blood pressure, heart rate, breathing rate, and blood oxygen level will be monitored until the medicines you were given have worn off.  Do not drive for 24 hours if you were given a sedative.  Your heart rhythm will be watched to make sure it does not change. This information is not intended to replace advice given to you by your health care provider. Make sure you discuss any questions you have with your health care provider. Document Released: 07/21/2002 Document Revised: 07/13/2017 Document Reviewed: 02/04/2016 Elsevier Patient Education  2020 [employer].   Exercise Stress Test  An exercise stress test is a test that is done to collect information about how your heart functions during exercise. The test is  done while you are walking on a treadmill or using an exercise bike. The goal is to raise your heart rate and "stress" the heart. The heart is evaluated before, during, and after you exercise. An electrocardiogram (ECG) will be used to monitor the heart, and your blood pressure will also be monitored. In some cases, nuclear scanning or an ultrasound of the heart (echocardiogram) will also be done to evaluate your heart. An exercise stress test is done to look for coronary artery disease (CAD). The test may also be done to:  Evaluate your limits of exercise during cardiac rehabilitation.  Check for high blood pressure during exercise.  Check how well you can exercise after such treatments as coronary stenting or new medicines.  Check for problems with blood flow to your arms and legs during exercise. If you have an abnormal test result, this may mean that you are not getting enough blood flow to your heart during exercise. More testing may be needed  to understand why your test was not normal. Tell a health care provider about:  Any allergies you have.  All medicines you are taking, including vitamins, herbs, eye drops, creams, and over-the-counter medicines.  Any blood disorders you have.  Any surgeries you have had.  Any medical conditions you have.  Whether you are pregnant or may be pregnant. What are the risks? Generally, this is a safe procedure. However, problems may occur, including:  Pain or pressure in the following areas: ? Chest. ? Jaw or neck. ? Between your shoulder blades. ? Down your left arm.  Dizziness or lightheadedness.  Shortness of breath.  Increased or irregular heartbeats.  Nausea or vomiting.  Heart attack (rare).  Life-threatening abnormal heart rhythm (rare). What happens before the procedure?  Follow instructions from your health care provider about eating or drinking restrictions. ? You may be told to avoid all forms of caffeine for 24 hours before the test. This includes coffee, tea (even decaffeinated tea), caffeinated sodas, chocolate, cocoa, and certain pain medicines.  Ask your health care provider about: ? Taking over-the-counter medicines, vitamins, herbs, and supplements. ? Changing or stopping your regular medicines. This is especially important if you are taking diabetes medicines or beta-blocker medicines.  If you have diabetes, ask how you are to take your insulin or pills. It is common to adjust your insulin dose the morning of the test.  If you are taking beta-blocker medicines, it is important to talk to your health care provider about these medicines well before the date of your test. Taking beta-blocker medicines may interfere with the test. In some cases, these medicines may need to be changed or stopped 24 hours or more before the test.  If you wear a nitroglycerin patch, it may need to be removed prior to the test. Ask your health care provider if the patch should be  removed before the test.  If you use an inhaler for any breathing condition, bring it with you to the test.  Do not apply lotions, powders, creams, or oils on your chest prior to the test.  Wear loose-fitting clothes and comfortable walking shoes.  Do not use any products that contain nicotine or tobacco, such as cigarettes and e-cigarettes, for 4 hours before the test or as told by your health care provider. If you need help quitting, ask your health care provider. What happens during the procedure?  Multiple electrodes will be attached to your chest.  Multiple wires will be attached to the electrodes. These  will transfer the electrical impulses from your heart to the ECG machine. Your heart will be monitored both at rest and while exercising.  If you are also having an echocardiogram or nuclear scanning, images of your heart will be taken before and after you exercise.  A blood pressure cuff will be placed around your arm to measure your blood pressure throughout the test. You will feel it tighten and loosen throughout the test.  You will walk on a treadmill or use a stationary bike. If you cannot use these, you may be asked to turn a crank with your hands.  You will start at a slow pace or level on the exercise machine. The exercise difficulty will be slowly increased to raise your heart rate. In the case of a treadmill, the speed and incline will gradually be increased.  You may be asked to periodically breathe into a tube. This measures the gases you breathe out.  You will be asked how you are feeling throughout the test. You will be asked to rate your level of exertion.  Tell the staff right away if you feel: ? Chest pain. ? Dizziness. ? Shortness of breath. ? Too fatigued to continue. ? Pain or aching in your legs or arms.  You will exercise until you have symptoms or until you reach a target heart rate. The test will also be stopped if you have changes in your blood pressure  or ECG readings, or if you develop an irregular heartbeat (arrhythmia). The procedure may vary among health care providers and hospitals. What happens after the procedure?  You will sit down and recover from the exercise. Your blood pressure, heart rate, and ECG will be monitored until you recover.  You may return to your normal schedule, including diet, activities, and medicines, unless your health care provider tells you otherwise.  It is up to you to get your test results. Ask your health care provider, or the department that is doing the test, when your results will be ready. Summary  An exercise stress test is a test that is done to collect information about how your heart functions during exercise.  This test is done to look for coronary artery disease (CAD).  During this test, you will walk on a treadmill or use an exercise bike to raise your heart rate.  It is important to follow instructions from your health care provider about eating and drinking restrictions before the test. This may include avoiding caffeine and certain medicines before the test. This information is not intended to replace advice given to you by your health care provider. Make sure you discuss any questions you have with your health care provider. Document Released: 07/28/2000 Document Revised: 05/03/2017 Document Reviewed: 10/04/2016 Elsevier Patient Education  2020 [employer]

## 2019-06-05 NOTE — Progress Notes (Signed)
awaiting advise from manager, Izora Gala on ordering sleep study referral.

## 2019-06-05 NOTE — Progress Notes (Signed)
ELECTROPHYSIOLOGY CONSULT NOTE  Patient ID: Sarah Sweeney, MRN: [id number], DOB/AGE: [birth year] 78 y.o. Admit date: (Not on file) Date of Consult: 06/05/2019  Primary Physician: Leonel Ramsay, MD Primary Cardiologist: CE     Sarah Sweeney is a 78 y.o. female who is being seen today for the evaluation of AFib at the request of RDunn. CE   HPI Sarah Sweeney is a 78 y.o. female referred for atrial fibrillation in setting of hypertrophic cardiomyopathy.  This was identified at time of colonoscopy.  She was unaware.  Heart rates have been slow.  She has no palpitations.  She does have dyspnea on exertion.  Some peripheral edema.  She has occasional nocturnal dyspnea no orthopnea.  Some abdominal distention.  This may relate to salt excess.  No syncope.  No children or siblings.  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 Allergy   . Diabetes mellitus without complication ([place])   . Erythrocytosis 03/02/2015  . GERD (gastroesophageal reflux disease)   . Headache   . Hypertension   . Pulmonary embolism ([facility])    Coumadin Therapy      Surgical History:  Past Surgical History:  Procedure Laterality Date  . APPENDECTOMY    . COLONOSCOPY WITH PROPOFOL N/A 03/04/2019   Procedure: COLONOSCOPY WITH PROPOFOL;  Surgeon: Toledo, Benay Pike, MD;  Location: [facility] ENDOSCOPY;  Service: Gastroenterology;  Laterality: N/A;  . TONSILLECTOMY       Home Meds: No outpatient medications have been marked as taking for the 06/05/19 encounter (Office Visit) with Deboraha Sprang, MD.    Allergies as of 06/05/2019   No Known Allergies     Medication List       Accurate as of June 05, 2019 10:55 AM. If you have any questions, ask your nurse or doctor.        atorvastatin 20 MG tablet Commonly known as: LIPITOR TAKE 1 TABLET BY MOUTH DAILY   latanoprost 0.005 % ophthalmic solution Commonly known as: XALATAN Apply 1 drop to eye at bedtime.   losartan-hydrochlorothiazide 50-12.5 MG tablet Commonly known as: HYZAAR   omeprazole 40 MG capsule Commonly known as: PRILOSEC Take 40 mg by mouth daily.   oxybutynin 10 MG 24 hr tablet Commonly known as: DITROPAN-XL Take 10 mg by mouth at bedtime.   warfarin 1 MG tablet Commonly known as: COUMADIN Take as directed by the anticoagulation clinic. If you are unsure how to take this medication, talk to your nurse or doctor. Original instructions: Take 1 tablet (1 mg total) by mouth daily.       Allergies: No Known Allergies  Social History   Socioeconomic History  . Marital status: Single    Spouse name: Not on file  . Number of children: Not on file  . Years of education: Not on file  . Highest education level: Not on file  Occupational History  . Not on file  Social Needs  . Financial resource strain:  Not on file  . Food insecurity    Worry: Not on file    Inability: Not on file  . Transportation needs    Medical: Not on file    Non-medical: Not on file  Tobacco Use  . Smoking status: Never Smoker  . Smokeless tobacco: Never Used  Substance and Sexual Activity  . Alcohol use: No  . Drug use: No  . Sexual activity: Not Currently  Lifestyle  . Physical activity    Days per week: Not on file    Minutes per session: Not on file  . Stress: Not on file  Relationships  . Social Musicianconnections    Talks on phone: Not on file    Gets together: Not on file    Attends religious service: Not on file    Active member of club or organization: Not on file    Attends meetings of clubs or organizations: Not  on file    Relationship status: Not on file  . Intimate partner violence    Fear of current or ex partner: Not on file    Emotionally abused: Not on file    Physically abused: Not on file    Forced sexual activity: Not on file  Other Topics Concern  . Not on file  Social History Narrative  . Not on file     Family History  Problem Relation Age of Onset  . Breast cancer Maternal Aunt        60's  . Cancer Maternal Uncle   . Stroke Maternal Grandfather      ROS:  Please see the history of present illness.     All other systems reviewed and negative.    Physical Exam:  Blood pressure 120/72, pulse (!) 46, height 5\' 5"  (1.651 m), weight 165 lb 4 oz (75 kg), SpO2 98 %. General: Well developed, well nourished female in no acute distress. Head: Normocephalic, atraumatic, sclera non-icteric, no xanthomas, nares are without discharge. EENT: normal  Lymph Nodes:  none Neck: Negative for carotid bruits. JVD 8 cm plus HJR Back:without scoliosis kyphosis Lungs: Clear bilaterally to auscultation without wheezes, rales, or rhonchi. Breathing is unlabored. Heart: RRR with S1 S2. No murmur . No rubs, or gallops appreciated. Abdomen: Soft, non-tender, non-distended with normoactive bowel sounds. No hepatomegaly. No rebound/guarding. No obvious abdominal masses. Msk:  Strength and tone appear normal for age. Extremities: No clubbing or cyanosis. 1+ edema.  Distal pedal pulses are 2+ and equal bilaterally. Skin: Warm and Dry Neuro: Alert and oriented X 3. CN III-XII intact Grossly normal sensory and motor function . Psych:  Responds to questions appropriately with a normal affect.      Labs: Cardiac Enzymes No results for input(s): CKTOTAL, CKMB, TROPONINI in the last 72 hours. CBC Lab Results  Component Value Date   WBC 5.8 12/12/2017   HGB 14.4 12/12/2017   HCT 43.3 12/12/2017   MCV 83.9 12/12/2017   PLT 231 12/12/2017   PROTIME: Recent Labs    06/02/19 1300  INR 2.1    Chemistry No results for input(s): NA, K, CL, CO2, BUN, CREATININE, CALCIUM, PROT, BILITOT, ALKPHOS, ALT, AST, GLUCOSE in the last 168 hours.  Invalid input(s): LABALBU Lipids No results found for: CHOL, HDL, LDLCALC, TRIG BNP No results found for: PROBNP Thyroid Function Tests: No results for input(s): TSH, T4TOTAL, T3FREE, THYROIDAB in the last 72 hours.  Invalid input(s): FREET3 Miscellaneous No results found for: DDIMER  Radiology/Studies:  No results found.  EKG:  Atrial fibrillation at 46 Intervals-/08/40 9/20 Atrial fib @ 53 -/07/43 LVH w repol  7/20 & 8/20 also Personally reviewed  >> Afib with slow VR    Event Recorder personnally reviewed  >> VT nonsustained >> 4 beats @ 165 bpm  Atrial fib persistent with max HR ( avg 73)  Pauses >>3,4 sec, most but not all occurring at night time hours   Heart rate ranges 28--118 with averages in the 70s during the day  Assessment and Plan:  Atrial fibrillation persistent  Hypertrophic Cardiomyopathy  Apical variant  Ventricular Tachycardia nonsustained   Pulmonary Embolism-unprovoked  anticoagulation with warfarin   Bradycardia  HFpEF    Patient has persistent atrial fibrillation with a slow ventricular response.  Would undertake cardioversion in the hopes that restoring sinus rhythm would improve functional capacity as well as address some of her heart failure without the need to change her medication.  Left atrial size is not so large that sinus rhythm is likely precluded.  She may benefit from antiarrhythmic therapy.  We also had a lengthy discussion regarding NOACs and warfarin with the relative benefits of decreased intracranial bleeding decrease strokes and decrease mortality associated with a different NOACs.  She is open to changing; she will check with her pharmacy to consider costs  Mildly volume overloaded.  Right now, we will continue her on her hydrochlorothiazide; can increas her to furosemide if necessary  but would defer till we restore sinus rhythm.  Bradycardia raises some degree of angst; her last ECG prior to her atrial fibrillation of which we have record is 2013.  At that time her heart rate was in the 80s/90s.  There is a recorded pulse from 5/19 in the mid 50s.  Undertaking cardioversion with pads is appropriate.  She may come to pacing.       Sherryl Manges

## 2019-06-06 ENCOUNTER — Telehealth: Payer: Self-pay | Admitting: Internal Medicine

## 2019-06-06 ENCOUNTER — Other Ambulatory Visit
Admission: RE | Admit: 2019-06-06 | Discharge: 2019-06-06 | Disposition: A | Discharge status: Home / Self Care | Payer: Medicare Other | Source: Ambulatory Visit | Attending: Internal Medicine | Admitting: Internal Medicine

## 2019-06-06 DIAGNOSIS — Z01812 Encounter for preprocedural laboratory examination: Secondary | ICD-10-CM | POA: Insufficient documentation

## 2019-06-06 DIAGNOSIS — Z20828 Contact with and (suspected) exposure to other viral communicable diseases: Secondary | ICD-10-CM | POA: Insufficient documentation

## 2019-06-06 LAB — CBC WITH DIFFERENTIAL/PLATELET
Basophils Absolute: 0.1 10*3/uL (ref 0.0–0.2)
Basos: 1 %
EOS (ABSOLUTE): 0.2 10*3/uL (ref 0.0–0.4)
Eos: 4 %
Hematocrit: 47.5 % — ABNORMAL HIGH (ref 34.0–46.6)
Hemoglobin: 15.2 g/dL (ref 11.1–15.9)
Immature Grans (Abs): 0 10*3/uL (ref 0.0–0.1)
Immature Granulocytes: 0 %
Lymphocytes Absolute: 1.6 10*3/uL (ref 0.7–3.1)
Lymphs: 32 %
MCH: 27.9 pg (ref 26.6–33.0)
MCHC: 32 g/dL (ref 31.5–35.7)
MCV: 87 fL (ref 79–97)
Monocytes Absolute: 0.6 10*3/uL (ref 0.1–0.9)
Monocytes: 12 %
Neutrophils Absolute: 2.4 10*3/uL (ref 1.4–7.0)
Neutrophils: 51 %
Platelets: 188 10*3/uL (ref 150–450)
RBC: 5.45 x10E6/uL — ABNORMAL HIGH (ref 3.77–5.28)
RDW: 13 % (ref 11.7–15.4)
WBC: 4.8 10*3/uL (ref 3.4–10.8)

## 2019-06-06 LAB — PROTIME-INR
INR: 1.9 — ABNORMAL HIGH (ref 0.9–1.2)
Prothrombin Time: 19.6 s — ABNORMAL HIGH (ref 9.1–12.0)

## 2019-06-06 LAB — BASIC METABOLIC PANEL
BUN/Creatinine Ratio: 18 (ref 12–28)
BUN: 18 mg/dL (ref 8–27)
CO2: 28 mmol/L (ref 20–29)
Calcium: 9.2 mg/dL (ref 8.7–10.3)
Chloride: 103 mmol/L (ref 96–106)
Creatinine, Ser: 1.01 mg/dL — ABNORMAL HIGH (ref 0.57–1.00)
GFR calc Af Amer: 62 mL/min/{1.73_m2} (ref >59)
GFR calc non Af Amer: 54 mL/min/{1.73_m2} — ABNORMAL LOW (ref >59)
Glucose: 100 mg/dL — ABNORMAL HIGH (ref 65–99)
Potassium: 4.2 mmol/L (ref 3.5–5.2)
Sodium: 142 mmol/L (ref 134–144)

## 2019-06-06 NOTE — Telephone Encounter (Signed)
I called and spoke with the patient this afternoon to advise her that I will need to cancel her DCCV for 10/28 as her INR draw from yesterday came back at 1.9.  She is aware she will need to have 4 weeks of therapeutic INR's prior to being able to have her DCCV.  She is also aware I will need to r/s her GXT until after her DCCV so Dr. Caryl Comes can perform this while she is is in NSR.  I have advised the patient I will forward a message to [facility] for Monday and let her know about her INR from yesterday. She can help to arrange coumadin checks for the next 4 weeks.   The patient voices understanding and is agreeable.   INR's: 03/31/19- 2.1 04/28/19- 2.4 06/02/19- 2.1 06/05/19 (lab draw)- 1.9  I have left a message on the scheduling voice mail to please cancel her DCCV for 06/11/19.

## 2019-06-07 LAB — SARS CORONAVIRUS 2 (TAT 6-24 HRS): SARS Coronavirus 2: NEGATIVE

## 2019-06-09 ENCOUNTER — Ambulatory Visit (INDEPENDENT_AMBULATORY_CARE_PROVIDER_SITE_OTHER): Payer: Medicare Other

## 2019-06-09 ENCOUNTER — Other Ambulatory Visit: Payer: Self-pay

## 2019-06-09 DIAGNOSIS — Z5181 Encounter for therapeutic drug level monitoring: Secondary | ICD-10-CM | POA: Diagnosis not present

## 2019-06-09 DIAGNOSIS — I4819 Other persistent atrial fibrillation: Secondary | ICD-10-CM | POA: Diagnosis not present

## 2019-06-09 DIAGNOSIS — I4891 Unspecified atrial fibrillation: Secondary | ICD-10-CM

## 2019-06-09 LAB — POCT INR: INR: 2.2 (ref 2.0–3.0)

## 2019-06-09 NOTE — Patient Instructions (Signed)
Please take 3 tablets tonight (since you're planning on having a salad) continue current dosage of 2 tablets every day. Recheck INR in 1 week (weekly INRs for 4 wks).

## 2019-06-09 NOTE — Telephone Encounter (Signed)
Spoke w/ pt.  She will come in this pm for INR check @ 1:30. Will set her up for weekly INRs until 4 therapeutic readings are achieved.

## 2019-06-11 ENCOUNTER — Encounter: Admission: RE | Payer: Self-pay | Source: Home / Self Care

## 2019-06-11 ENCOUNTER — Ambulatory Visit: Admission: RE | Admit: 2019-06-11 | Payer: Medicare Other | Source: Home / Self Care | Admitting: Cardiology

## 2019-06-11 SURGERY — CARDIOVERSION
Anesthesia: General

## 2019-06-18 ENCOUNTER — Ambulatory Visit (INDEPENDENT_AMBULATORY_CARE_PROVIDER_SITE_OTHER): Payer: Medicare Other

## 2019-06-18 ENCOUNTER — Other Ambulatory Visit: Payer: Self-pay

## 2019-06-18 DIAGNOSIS — I4891 Unspecified atrial fibrillation: Secondary | ICD-10-CM | POA: Diagnosis not present

## 2019-06-18 DIAGNOSIS — I4819 Other persistent atrial fibrillation: Secondary | ICD-10-CM | POA: Diagnosis not present

## 2019-06-18 DIAGNOSIS — Z5181 Encounter for therapeutic drug level monitoring: Secondary | ICD-10-CM | POA: Diagnosis not present

## 2019-06-18 LAB — POCT INR: INR: 2.1 (ref 2.0–3.0)

## 2019-06-18 NOTE — Patient Instructions (Signed)
Please START NEW DOSAGE of 2 tablets every day EXCEPT 3 TABLETS ON WEDNESDAYS. Recheck INR in 1 week (weekly INRs for 4 wks).

## 2019-06-23 ENCOUNTER — Ambulatory Visit (INDEPENDENT_AMBULATORY_CARE_PROVIDER_SITE_OTHER): Payer: Medicare Other

## 2019-06-23 ENCOUNTER — Other Ambulatory Visit: Payer: Self-pay

## 2019-06-23 DIAGNOSIS — I4891 Unspecified atrial fibrillation: Secondary | ICD-10-CM | POA: Diagnosis not present

## 2019-06-23 DIAGNOSIS — Z5181 Encounter for therapeutic drug level monitoring: Secondary | ICD-10-CM

## 2019-06-23 DIAGNOSIS — I4819 Other persistent atrial fibrillation: Secondary | ICD-10-CM | POA: Diagnosis not present

## 2019-06-23 LAB — POCT INR: INR: 2.2 (ref 2.0–3.0)

## 2019-06-23 NOTE — Patient Instructions (Signed)
Please START NEW DOSAGE of 2 tablets every day EXCEPT 3 TABLETS ON MONDAYS & FRIDAYS. Recheck INR in 1 week (weekly INRs for 4 wks).

## 2019-06-26 ENCOUNTER — Telehealth: Payer: Self-pay | Admitting: Internal Medicine

## 2019-06-26 DIAGNOSIS — I4819 Other persistent atrial fibrillation: Secondary | ICD-10-CM

## 2019-06-26 DIAGNOSIS — Z01812 Encounter for preprocedural laboratory examination: Secondary | ICD-10-CM

## 2019-06-26 NOTE — Telephone Encounter (Signed)
Attempted to call the patient. No answer- I left a message to call back to discuss scheduling her DCCV.  INR's have been: 10/26- 2.2 11/4- 2.1 11/9- 2.2 11/18- (next check)  I left a detailed message on her voice mail I was looking at possibly 11/20 or 11/23.

## 2019-07-02 ENCOUNTER — Ambulatory Visit (INDEPENDENT_AMBULATORY_CARE_PROVIDER_SITE_OTHER): Payer: Medicare Other

## 2019-07-02 ENCOUNTER — Other Ambulatory Visit: Payer: Medicare Other

## 2019-07-02 ENCOUNTER — Other Ambulatory Visit: Payer: Self-pay

## 2019-07-02 DIAGNOSIS — Z5181 Encounter for therapeutic drug level monitoring: Secondary | ICD-10-CM

## 2019-07-02 DIAGNOSIS — I4819 Other persistent atrial fibrillation: Secondary | ICD-10-CM

## 2019-07-02 DIAGNOSIS — I4891 Unspecified atrial fibrillation: Secondary | ICD-10-CM

## 2019-07-02 LAB — POCT INR: INR: 2.6 (ref 2.0–3.0)

## 2019-07-02 NOTE — Patient Instructions (Signed)
Please continue dosage of 2 tablets every day EXCEPT 3 TABLETS ON MONDAYS & FRIDAYS. Recheck INR in 2 weeks.

## 2019-07-03 ENCOUNTER — Encounter: Payer: Medicare Other | Admitting: Internal Medicine

## 2019-07-03 ENCOUNTER — Encounter: Payer: Self-pay | Admitting: *Deleted

## 2019-07-03 NOTE — Telephone Encounter (Signed)
I spoke with the patient.  I have advised her since her INR's have been within range for the last 4 weeks, then I needed to set her up to have her DCCV done. I advised the patient I would like to do this next week, but she refused as she has no transportation next week.  She would like to have this done on 12/3. I have advised I will set her up for this to be done on 12/3. She is aware she will need labs & a COVID swab on 11/30.  Pre-procedure instructions reviewed with the patient and she voices understanding. Written copy also mailed to the patient.  You are scheduled for a Cardioversion on Thursday 07/17/2019 with Dr. Rockey Situ.  Please arrive at the [place] [facility] at 6:30  a.m. on the day of your procedure. 1st desk on the right to check in.  DIET INSTRUCTIONS:  Nothing to eat or drink after midnight the night before your procedure.         1) Labs:   Pre procedure lab draw: (BMP/ CBC/ INR) - Monday 07/14/19 (12:00 pm- 2:00 pm) - [place] entrance, 1st desk on the right to check in   Pre procedure COVID swab:  - Monday 07/14/19 (12:30 pm- 2:30 pm) - [facility], drive up test   2) Medications:  YOU MAY TAKE ALL of your medications with a small amount of water the morning of your procedure  3) Must have a responsible person to drive you home.  4) Bring a current list of your medications and current insurance cards.    If you have any questions after you get home, please call the office at [phone]. Alvis Lemmings, RN, BSN  She is aware I will touch base with her after her DCCV to schedule her GXT with Dr. Caryl Comes.  The patient voices understanding of the above recommendations and is agreeable.

## 2019-07-08 ENCOUNTER — Other Ambulatory Visit: Payer: Self-pay | Admitting: Internal Medicine

## 2019-07-14 ENCOUNTER — Telehealth: Payer: Self-pay | Admitting: Internal Medicine

## 2019-07-14 ENCOUNTER — Other Ambulatory Visit
Admission: RE | Admit: 2019-07-14 | Discharge: 2019-07-14 | Disposition: A | Discharge status: Home / Self Care | Payer: Medicare Other | Source: Ambulatory Visit | Attending: Internal Medicine | Admitting: Internal Medicine

## 2019-07-14 ENCOUNTER — Other Ambulatory Visit
Admission: RE | Admit: 2019-07-14 | Discharge: 2019-07-14 | Disposition: A | Discharge status: Home / Self Care | Payer: Medicare Other | Source: Ambulatory Visit | Attending: Cardiovascular Disease | Admitting: Cardiovascular Disease

## 2019-07-14 DIAGNOSIS — Z20828 Contact with and (suspected) exposure to other viral communicable diseases: Secondary | ICD-10-CM | POA: Insufficient documentation

## 2019-07-14 DIAGNOSIS — I4819 Other persistent atrial fibrillation: Secondary | ICD-10-CM | POA: Diagnosis not present

## 2019-07-14 DIAGNOSIS — Z01812 Encounter for preprocedural laboratory examination: Secondary | ICD-10-CM

## 2019-07-14 LAB — BASIC METABOLIC PANEL
Anion gap: 11 (ref 5–15)
BUN: 20 mg/dL (ref 8–23)
CO2: 27 mmol/L (ref 22–32)
Calcium: 9.3 mg/dL (ref 8.9–10.3)
Chloride: 104 mmol/L (ref 98–111)
Creatinine, Ser: 1.01 mg/dL — ABNORMAL HIGH (ref 0.44–1.00)
GFR calc Af Amer: >60 mL/min (ref >60)
GFR calc non Af Amer: 54 mL/min — ABNORMAL LOW (ref >60)
Glucose, Bld: 85 mg/dL (ref 70–99)
Potassium: 4.2 mmol/L (ref 3.5–5.1)
Sodium: 142 mmol/L (ref 135–145)

## 2019-07-14 LAB — CBC WITH DIFFERENTIAL/PLATELET
Abs Immature Granulocytes: 0 10*3/uL (ref 0.00–0.07)
Basophils Absolute: 0 10*3/uL (ref 0.0–0.1)
Basophils Relative: 1 %
Eosinophils Absolute: 0.2 10*3/uL (ref 0.0–0.5)
Eosinophils Relative: 3 %
HCT: 45.5 % (ref 36.0–46.0)
Hemoglobin: 14.3 g/dL (ref 12.0–15.0)
Immature Granulocytes: 0 %
Lymphocytes Relative: 27 %
Lymphs Abs: 1.5 10*3/uL (ref 0.7–4.0)
MCH: 28.2 pg (ref 26.0–34.0)
MCHC: 31.4 g/dL (ref 30.0–36.0)
MCV: 89.7 fL (ref 80.0–100.0)
Monocytes Absolute: 0.6 10*3/uL (ref 0.1–1.0)
Monocytes Relative: 10 %
Neutro Abs: 3.3 10*3/uL (ref 1.7–7.7)
Neutrophils Relative %: 59 %
Platelets: 184 10*3/uL (ref 150–400)
RBC: 5.07 MIL/uL (ref 3.87–5.11)
RDW: 13.6 % (ref 11.5–15.5)
WBC: 5.5 10*3/uL (ref 4.0–10.5)
nRBC: 0 % (ref 0.0–0.2)

## 2019-07-14 LAB — PROTIME-INR
INR: 2.1 — ABNORMAL HIGH (ref 0.8–1.2)
Prothrombin Time: 23.3 seconds — ABNORMAL HIGH (ref 11.4–15.2)

## 2019-07-15 LAB — SARS CORONAVIRUS 2 (TAT 6-24 HRS): SARS Coronavirus 2: NEGATIVE

## 2019-07-16 NOTE — Anesthesia Preprocedure Evaluation (Addendum)
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   Neuro/Psych  Headaches, negative psych ROS   GI/Hepatic Neg liver ROS, GERD  Controlled,  Endo/Other  diabetes  Renal/GU negative Renal ROS  negative genitourinary   Musculoskeletal   Abdominal   Peds  Hematology negative hematology ROS (+)   Anesthesia Other Findings Past Medical History: No date: Allergy No date: Diabetes mellitus without complication ([place]) 3/71/6967: Erythrocytosis No date: GERD (gastroesophageal reflux disease) No date: Headache No date: Hypertension No date: Pulmonary embolism (HCC)     Comment:  Coumadin Therapy  Past Surgical History: No date: APPENDECTOMY 03/04/2019: COLONOSCOPY WITH PROPOFOL; N/A     Comment:  Procedure: COLONOSCOPY WITH PROPOFOL;  Surgeon: [place],               Benay Pike, MD;  Location: [facility] ENDOSCOPY;  Service:               Gastroenterology;  Laterality: N/A; No date: TONSILLECTOMY     Reproductive/Obstetrics negative OB ROS                            Anesthesia Physical Anesthesia Plan  ASA: III  Anesthesia Plan: General   Post-op Pain Management:    Induction:   PONV Risk Score and Plan:   Airway Management Planned: Natural Airway and Nasal Cannula  Additional Equipment:   Intra-op Plan:   Post-operative Plan:    Informed Consent: I have reviewed the patients History and Physical, chart, labs and discussed the procedure including the risks, benefits and alternatives for the proposed anesthesia with the patient or authorized representative who has indicated his/her understanding and acceptance.     Dental Advisory Given  Plan Discussed with: Anesthesiologist  Anesthesia Plan Comments:        Anesthesia Quick Evaluation

## 2019-07-17 ENCOUNTER — Other Ambulatory Visit: Payer: Self-pay

## 2019-07-17 ENCOUNTER — Ambulatory Visit: Payer: Medicare Other | Admitting: Anesthesiology

## 2019-07-17 ENCOUNTER — Encounter
Admission: RE | Disposition: A | Discharge status: Home / Self Care | Payer: Self-pay | Source: Home / Self Care | Attending: Cardiovascular Disease

## 2019-07-17 ENCOUNTER — Ambulatory Visit
Admission: RE | Admit: 2019-07-17 | Discharge: 2019-07-17 | Disposition: A | Discharge status: Home / Self Care | Payer: Medicare Other | Attending: Cardiovascular Disease | Admitting: Cardiovascular Disease

## 2019-07-17 DIAGNOSIS — I4819 Other persistent atrial fibrillation: Secondary | ICD-10-CM | POA: Insufficient documentation

## 2019-07-17 DIAGNOSIS — I495 Sick sinus syndrome: Secondary | ICD-10-CM | POA: Insufficient documentation

## 2019-07-17 HISTORY — PX: CARDIOVERSION: SHX1299

## 2019-07-17 LAB — PROTIME-INR
INR: 1.9 — ABNORMAL HIGH (ref 0.8–1.2)
Prothrombin Time: 21.6 seconds — ABNORMAL HIGH (ref 11.4–15.2)

## 2019-07-17 SURGERY — CARDIOVERSION
Anesthesia: General

## 2019-07-17 MED ORDER — PHENYLEPHRINE HCL (PRESSORS) 10 MG/ML IV SOLN
INTRAVENOUS | Status: DC | PRN
  Administered 2019-07-17: 100 ug via INTRAVENOUS

## 2019-07-17 MED ORDER — SODIUM CHLORIDE 0.9 % IV SOLN
INTRAVENOUS | Status: DC | PRN
  Administered 2019-07-17: 08:00:00 via INTRAVENOUS

## 2019-07-17 MED ORDER — PROPOFOL 10 MG/ML IV BOLUS
INTRAVENOUS | Status: AC
  Filled 2019-07-17: qty 20

## 2019-07-17 MED ORDER — PROPOFOL 10 MG/ML IV BOLUS
INTRAVENOUS | Status: DC | PRN
  Administered 2019-07-17: 70 mg via INTRAVENOUS

## 2019-07-17 NOTE — Transfer of Care (Signed)
Immediate Anesthesia Transfer of Care Note  Patient: Sarah Sweeney  Procedure(s) Performed: CARDIOVERSION (N/A )  Patient Location: PACU and Cath Lab  Anesthesia Type:General  Level of Consciousness: awake and drowsy  Airway & Oxygen Therapy: Patient Spontanous Breathing and Patient connected to nasal cannula oxygen  Post-op Assessment: Report given to RN and Post -op Vital signs reviewed and stable  Post vital signs: Reviewed and stable  Last Vitals:  Vitals Value Taken Time  BP 99/85 07/17/19 0745  Temp    Pulse 41 07/17/19 0747  Resp 18 07/17/19 0747  SpO2 97 % 07/17/19 0747    Last Pain:  Vitals:   07/17/19 0716  PainSc: 0-No pain         Complications: No apparent anesthesia complications

## 2019-07-17 NOTE — Anesthesia Postprocedure Evaluation (Signed)
Anesthesia Post Note  Patient: Sarah Sweeney  Procedure(s) Performed: CARDIOVERSION (N/A )  Patient location during evaluation: PACU Anesthesia Type: General Level of consciousness: awake and alert Pain management: pain level controlled Vital Signs Assessment: post-procedure vital signs reviewed and stable Respiratory status: spontaneous breathing, nonlabored ventilation and respiratory function stable Cardiovascular status: blood pressure returned to baseline and stable Postop Assessment: no apparent nausea or vomiting Anesthetic complications: no     Last Vitals:  Vitals:   07/17/19 0800 07/17/19 0815  BP: (!) 101/58 130/65  Pulse: (!) 36 (!) 43  Resp: 15 17  SpO2: 97% 100%    Last Pain:  Vitals:   07/17/19 0815  PainSc: 0-No pain                 Durenda Hurt

## 2019-07-17 NOTE — CV Procedure (Signed)
Cardioversion procedure note For atrial fibrillation, persistent, SSS.  Procedure Details:  Consent: Risks of procedure as well as the alternatives and risks of each were explained to the (patient/caregiver). Consent for procedure obtained.  Time Out: Verified patient identification, verified procedure, site/side was marked, verified correct patient position, special equipment/implants available, medications/allergies/relevent history reviewed, required imaging and test results available. Performed  Patient placed on cardiac monitor, pulse oximetry, supplemental oxygen as necessary.  Sedation given: propofol IV, Dr. Ola Spurr Pacer pads placed anterior and posterior chest.   Cardioverted 1 time(s).  Cardioverted at  150 J. Synchronized biphasic Converted to NSR   Evaluation: Findings: Post procedure EKG shows: NSR Complications: None Patient did tolerate procedure well.  Time Spent Directly with the Patient:  58 minutes   Esmond Plants, M.D., Ph.D.

## 2019-07-17 NOTE — Anesthesia Post-op Follow-up Note (Signed)
Anesthesia QCDR form completed.        

## 2019-07-18 ENCOUNTER — Telehealth: Payer: Self-pay | Admitting: Internal Medicine

## 2019-07-18 DIAGNOSIS — I4819 Other persistent atrial fibrillation: Secondary | ICD-10-CM

## 2019-07-18 NOTE — Telephone Encounter (Signed)
I called and spoke with the patient. I advised her that the plan had been, from her last office visit, to get her cardioverted then do a GXT on her in sinus rhythm.  The patient is aware I have reviewed this again with Dr. Caryl Comes and he is wanting to put her on the treadmill next week on Friday if possible.  The patient voices understanding and is agreeable. She is aware she will need to have COVID test the morning prior - 12/10 (between 8:30 am- 11:00 am)  She will come for her GXT on 12/11 at 11:20 am. I have asked her to arrive at 11:00 am at the [facility] for screening and check in.  The instructions below have been reviewed verbally with the patient and she voices understanding.  - you may eat a light breakfast/ lunch prior to your procedure - no caffeine for 24 hours prior to your test (coffee, tea, soft drinks, or chocolate)  - no smoking/ vaping for 4 hours prior to your test - you may take your regular medications the day of your test  - bring any inhalers with you to your test - wear comfortable clothing & tennis/ non-skid shoes to walk on the treadmill

## 2019-07-20 NOTE — H&P (Signed)
H&P Addendum, pre-cardioversion  Patient was seen and evaluated prior to -cardioversion procedure Symptoms, prior testing details again confirmed with the patient Patient examined, no significant change from prior exam Lab work reviewed in detail personally by myself Patient understands risk and benefit of the procedure, willing to proceed  Signed, Tim Gollan, MD, Ph.D [facility]  

## 2019-07-24 ENCOUNTER — Other Ambulatory Visit
Admission: RE | Admit: 2019-07-24 | Discharge: 2019-07-24 | Disposition: A | Discharge status: Home / Self Care | Payer: Medicare Other | Source: Ambulatory Visit | Attending: Internal Medicine | Admitting: Internal Medicine

## 2019-07-24 DIAGNOSIS — Z01812 Encounter for preprocedural laboratory examination: Secondary | ICD-10-CM | POA: Insufficient documentation

## 2019-07-24 DIAGNOSIS — Z20828 Contact with and (suspected) exposure to other viral communicable diseases: Secondary | ICD-10-CM | POA: Insufficient documentation

## 2019-07-24 LAB — SARS CORONAVIRUS 2 (TAT 6-24 HRS): SARS Coronavirus 2: NEGATIVE

## 2019-07-25 ENCOUNTER — Ambulatory Visit (INDEPENDENT_AMBULATORY_CARE_PROVIDER_SITE_OTHER): Payer: Medicare Other | Admitting: *Deleted

## 2019-07-25 ENCOUNTER — Other Ambulatory Visit: Payer: Self-pay

## 2019-07-25 ENCOUNTER — Ambulatory Visit: Payer: Medicare Other | Admitting: Internal Medicine

## 2019-07-25 VITALS — BP 138/83 | HR 60 | Resp 18

## 2019-07-25 DIAGNOSIS — I4819 Other persistent atrial fibrillation: Secondary | ICD-10-CM | POA: Diagnosis not present

## 2019-07-25 NOTE — Progress Notes (Signed)
1.) Reason for visit: EKG (initally here for a GXT to be done, but out of rhythm)  2.) Name of MD requesting visit: Caryl Comes  3.) H&P: The patient has a history of a-fib. She had a cardioversion on 07/17/19 and was instructed to come back a week later for a GXT to be done while in NSR. This was to help determine antiarrhythmics going forward.  4.) ROS related to problem: The patient's resting EKG showed the patient was back in a-fib with controlled heart rates. The patient states she cannot determine when she goes in/ out of rhythm.   5.) Assessment and plan per MD:  The patient's GXT was not performed per Dr. Caryl Comes. He discussed with the patient as she was feeling good and options for antiarrhythmics are limited with her HCM, that we will not plan on making any further changes at this time. She will follow up in 3 months with Dr. Saunders Revel.

## 2019-07-25 NOTE — Patient Instructions (Signed)
Medication Instructions:  - Your physician recommends that you continue on your current medications as directed. Please refer to the Current Medication list given to you today.  *If you need a refill on your cardiac medications before your next appointment, please call your pharmacy*  Lab Work: - none ordered  If you have labs (blood work) drawn today and your tests are completely normal, you will receive your results only by: Marland Kitchen MyChart Message (if you have MyChart) OR . A paper copy in the mail If you have any lab test that is abnormal or we need to change your treatment, we will call you to review the results.  Testing/Procedures: - none ordered  Follow-Up: At [facility], you and your health needs are our priority.  As part of our continuing mission to provide you with exceptional heart care, we have created designated Provider Care Teams.  These Care Teams include your primary Cardiologist (physician) and Advanced Practice Providers (APPs -  Physician Assistants and Nurse Practitioners) who all work together to provide you with the care you need, when you need it.  Your next appointment:   3 month(s)  The format for your next appointment:   In Person  Provider:   Nelva Bush, MD  Other Instructions n/a

## 2019-07-25 NOTE — Patient Instructions (Signed)
Medication Instructions:  - Your physician recommends that you continue on your current medications as directed. Please refer to the Current Medication list given to you today.  *If you need a refill on your cardiac medications before your next appointment, please call your pharmacy*  Lab Work: - none ordered  If you have labs (blood work) drawn today and your tests are completely normal, you will receive your results only by: . MyChart Message (if you have MyChart) OR . A paper copy in the mail If you have any lab test that is abnormal or we need to change your treatment, we will call you to review the results.  Testing/Procedures: - none ordered  Follow-Up: At [facility], you and your health needs are our priority.  As part of our continuing mission to provide you with exceptional heart care, we have created designated Provider Care Teams.  These Care Teams include your primary Cardiologist (physician) and Advanced Practice Providers (APPs -  Physician Assistants and Nurse Practitioners) who all work together to provide you with the care you need, when you need it.  Your next appointment:   3 month(s)  The format for your next appointment:   In Person  Provider:   Christopher End, MD  Other Instructions n/a  

## 2019-08-01 NOTE — H&P (Signed)
H&P Addendum, pre-cardioversion  Patient was seen and evaluated prior to -cardioversion procedure Symptoms, prior testing details again confirmed with the patient Patient examined, no significant change from prior exam Lab work reviewed in detail personally by myself Patient understands risk and benefit of the procedure, willing to proceed  Signed, Tim Gollan, MD, Ph.D [facility]  

## 2019-09-24 ENCOUNTER — Ambulatory Visit: Payer: Medicare Other | Admitting: Pulmonary Disease

## 2019-09-24 ENCOUNTER — Encounter: Payer: Self-pay | Admitting: Pulmonary Disease

## 2019-09-24 ENCOUNTER — Other Ambulatory Visit: Payer: Self-pay

## 2019-09-24 VITALS — BP 140/64 | HR 71 | Temp 97.7°F | Ht 64.0 in | Wt 163.4 lb

## 2019-09-24 DIAGNOSIS — R0683 Snoring: Secondary | ICD-10-CM | POA: Diagnosis not present

## 2019-09-24 NOTE — Patient Instructions (Signed)
Will arrange for home sleep study Will call to arrange for follow up after sleep study reviewed  

## 2019-09-24 NOTE — Progress Notes (Signed)
[place] [facility], [facility] Sleep Medicine  Chief Complaint  Patient presents with  . Sleep consult    per Nicolasa Ducking- no prior sleep study. c/o loud snoring.     Constitutional:  BP 140/64 (BP Location: Left Arm, Cuff Size: Normal)   Pulse 71   Temp 97.7 F (36.5 C) (Temporal)   Ht 5\' 4"  (1.626 m)   Wt 163 lb 6.4 oz (74.1 kg)   BMI 28.05 kg/m   Past Medical History:  DM, GERD, HA, HTN, PE, Persistent A fib, Hypertrophic CM, non sustained VT  Brief Summary:  Sarah Sweeney is a 79 y.o. female with snoring.  She is followed by Cardiology.  She was noted to have snoring.  Her sister has sleep apnea and wears CPAP.  She was advised to have further sleep assessment.  She lives alone.  Not sure if she stops breathing while asleep.  Wakes up 2 times to use bathroom.  Doesn't use anything to help sleep or stay awake.  Denies headaches.    Epworth score 1 out of 24.  Physical Exam:   Appearance - well kempt   ENMT - clear nasal mucosa, midline nasal  septum, no oral exudates, no LAN, trachea midline, narrow jaw, MP 3  Respiratory - normal chest wall, normal respiratory effort, no accessory muscle use, no wheeze/rales  CV - irregular rate and rhythm, 2/6 SM, no peripheral edema, radial pulses symmetric  GI - soft, non tender, no masses  Lymph - no adenopathy noted in neck and axillary areas  MSK - normal gait  Ext - no cyanosis, clubbing, or joint inflammation noted  Skin - no rashes, lesions, or ulcers  Neuro - normal strength, oriented x 3  Psych - normal mood and affect  Discussion:  She has snoring, and sleep disruption.  She has history of hypertension, hypertrophic cardiomyopathy, and persistent atrial fibrillation.  She has family history of sleep apnea.  I am concerned she could also have sleep apnea.  Assessment/Plan:    Snoring with excessive daytime sleepiness. - will need to arrange for a home sleep study  Obesity. - discussed how  weight can impact sleep and risk for sleep disordered breathing - discussed options to assist with weight loss: combination of diet modification, cardiovascular and strength training exercises  Cardiovascular risk. - had an extensive discussion regarding the adverse health consequences related to untreated sleep disordered breathing - specifically discussed the risks for hypertension, coronary artery disease, cardiac dysrhythmias, cerebrovascular disease, and diabetes - lifestyle modification discussed  Safe driving practices. - discussed how sleep disruption can increase risk of accidents, particularly when driving - safe driving practices were discussed  Therapies for obstructive sleep apnea. - if the sleep study shows significant sleep apnea, then various therapies for treatment were reviewed: CPAP, oral appliance, and surgical interventions   Patient Instructions  Will arrange for home sleep study Will call to arrange for follow up after sleep study reviewed   Time spent 23 minutes.  62, MD  Pulmonary/Critical Care Pager: [phone] 09/24/2019, 11:23 AM  Flow Sheet    Chest imaging:  CT chest 10/18/15 >> LLL nodule stable since 2014, coronary calcification, cholelithiasis  Sleep tests:    Cardiac tests:  Echo 03/19/19 >> EF 50 to 55%, severe LVH, mild/mod MR  Medications:   Allergies as of 09/24/2019   No Known Allergies     Medication List       Accurate as of September 24, 2019 11:23 AM.  If you have any questions, ask your nurse or doctor.        atorvastatin 20 MG tablet Commonly known as: LIPITOR TAKE 1 TABLET BY MOUTH DAILY   latanoprost 0.005 % ophthalmic solution Commonly known as: XALATAN Apply 1 drop to eye at bedtime.   losartan-hydrochlorothiazide 50-12.5 MG tablet Commonly known as: HYZAAR Take 1 tablet by mouth daily.   omeprazole 40 MG capsule Commonly known as: PRILOSEC Take 40 mg by mouth daily.   oxybutynin 15 MG  24 hr tablet Commonly known as: DITROPAN XL Take 15 mg by mouth at bedtime.   warfarin 1 MG tablet Commonly known as: COUMADIN Take 1 tablet (1 mg total) by mouth daily. What changed:   how much to take  when to take this  additional instructions       Past Surgical History:  She  has a past surgical history that includes Appendectomy; Tonsillectomy; Colonoscopy with propofol (N/A, 03/04/2019); and Cardioversion (N/A, 07/17/2019).  Family History:  Her family history includes Breast cancer in her maternal aunt; Cancer in her maternal uncle; Stroke in her maternal grandfather.  Social History:  She  reports that she has never smoked. She has never used smokeless tobacco. She reports that she does not drink alcohol or use drugs.

## 2019-10-23 ENCOUNTER — Encounter: Payer: Self-pay | Admitting: Internal Medicine

## 2019-10-23 ENCOUNTER — Other Ambulatory Visit: Payer: Self-pay

## 2019-10-23 ENCOUNTER — Ambulatory Visit (INDEPENDENT_AMBULATORY_CARE_PROVIDER_SITE_OTHER): Payer: Medicare Other | Admitting: Internal Medicine

## 2019-10-23 VITALS — BP 136/70 | HR 71 | Ht 64.0 in | Wt 165.5 lb

## 2019-10-23 DIAGNOSIS — I4819 Other persistent atrial fibrillation: Secondary | ICD-10-CM

## 2019-10-23 DIAGNOSIS — I422 Other hypertrophic cardiomyopathy: Secondary | ICD-10-CM

## 2019-10-23 DIAGNOSIS — I10 Essential (primary) hypertension: Secondary | ICD-10-CM | POA: Diagnosis not present

## 2019-10-23 NOTE — Patient Instructions (Signed)
Medication Instructions:  Your physician recommends that you continue on your current medications as directed. Please refer to the Current Medication list given to you today.  *If you need a refill on your cardiac medications before your next appointment, please call your pharmacy*   Lab Work: None If you have labs (blood work) drawn today and your tests are completely normal, you will receive your results only by: . MyChart Message (if you have MyChart) OR . A paper copy in the mail If you have any lab test that is abnormal or we need to change your treatment, we will call you to review the results.   Testing/Procedures: None   Follow-Up: At [facility], you and your health needs are our priority.  As part of our continuing mission to provide you with exceptional heart care, we have created designated Provider Care Teams.  These Care Teams include your primary Cardiologist (physician) and Advanced Practice Providers (APPs -  Physician Assistants and Nurse Practitioners) who all work together to provide you with the care you need, when you need it.  We recommend signing up for the patient portal called "MyChart".  Sign up information is provided on this After Visit Summary.  MyChart is used to connect with patients for Virtual Visits (Telemedicine).  Patients are able to view lab/test results, encounter notes, upcoming appointments, etc.  Non-urgent messages can be sent to your provider as well.   To learn more about what you can do with MyChart, go to [email or web address]://[email or web address].com.    Your next appointment:   6 month(s)  The format for your next appointment:   In Person  Provider:    You may see Christopher End, MD or one of the following Advanced Practice Providers on your designated Care Team:    Christopher Berge, NP  Ryan Dunn, PA-C  Jacquelyn Visser, PA-C    

## 2019-10-23 NOTE — Progress Notes (Signed)
Follow-up Outpatient Visit Date: 10/23/2019  Primary Care Provider: Mick Sell, MD [address] [place] [address]  Chief Complaint: Follow-up atrial fibrillation  HPI:  Sarah Sweeney is a 79 y.o. 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  She was last seen in our office by Dr. Graciela Husbands in October, at which time she was noted to have persistent atrial fibrillation with slow ventricular response.  Cardioversion was recommended; no other thoughts were provided regarding hypertrophic cardiomyopathy.  She was cardioverted by Dr. Mariah Milling in early December but was found to be back in atrial fibrillation when she presented for GXT to determine potential antiarrhythmic therapy.  No further follow-up with EP was made.  Today, Sarah Sweeney reports that she has been feeling relatively well.  She notes intermittent lightheadedness, most often when she is doing light activities such as cooking.  She is trying to stay well-hydrated.  Usually, the lightheadedness passes within 5 minutes.  She has not passed out or fallen.  She denies chest pain, palpitations, and orthopnea.  She has intermittent dependent edema, especially if she eats particularly salty food.  She remains on warfarin without any significant bleeding.  --------------------------------------------------------------------------------------------------  Past Medical History:  Diagnosis Date  . Allergy   . Apical variant hypertrophic cardiomyopathy (HCC)   . Diabetes mellitus without complication (HCC)   . Erythrocytosis 03/02/2015  . GERD (gastroesophageal reflux disease)   . Headache   . Hypertension   . Pulmonary embolism (HCC)    Coumadin Therapy    Past Surgical History:  Procedure Laterality Date  . APPENDECTOMY    . CARDIOVERSION N/A 07/17/2019   Procedure: CARDIOVERSION;  Surgeon: Antonieta Iba, MD;  Location: [facility] ORS;  Service: Cardiovascular;  Laterality: N/A;  . COLONOSCOPY WITH PROPOFOL N/A 03/04/2019   Procedure: COLONOSCOPY WITH PROPOFOL;  Surgeon: Toledo, Boykin Nearing, MD;  Location: [facility] ENDOSCOPY;  Service: Gastroenterology;  Laterality: N/A;  . TONSILLECTOMY      Current Meds  Medication Sig  . atorvastatin (LIPITOR) 20 MG tablet TAKE 1 TABLET BY MOUTH DAILY (Patient taking differently: Take 20 mg by mouth daily. )  . latanoprost (XALATAN) 0.005 % ophthalmic solution Apply 1 drop to eye at bedtime.   Marland Kitchen losartan-hydrochlorothiazide (HYZAAR) 50-12.5 MG tablet Take 1 tablet by mouth daily.   Marland Kitchen omeprazole (PRILOSEC) 40 MG capsule Take 40 mg by mouth daily.   Marland Kitchen oxybutynin (DITROPAN XL) 15 MG 24 hr tablet Take 15 mg by mouth at bedtime.   Marland Kitchen warfarin (COUMADIN) 1 MG tablet Take 1 tablet (1 mg total) by mouth daily. (Patient taking differently: Take 2-3 mg by mouth See admin instructions. Take  3 mg on Monday and Friday Take 2 mg all the other days in the evening)    Allergies: Patient has no known allergies.  Social History   Tobacco Use  . Smoking status: Never Smoker  . Smokeless tobacco: Never Used  Substance Use Topics  . Alcohol use: No  . Drug use: No    Family History  Problem Relation Age of Onset  . Breast cancer Maternal Aunt        60's  . Cancer Maternal Uncle   . Stroke Maternal Grandfather     Review of Systems: A 12-system review of systems was performed and was negative  except as noted in the HPI.  --------------------------------------------------------------------------------------------------  Physical Exam: BP 136/70 (BP Location: Left Arm, Patient Position: Sitting, Cuff Size: Normal)   Pulse 71   Ht 5\' 4"  (1.626 m)   Wt 165 lb 8 oz (75.1 kg)   SpO2 96%   BMI 28.41 kg/m    General: NAD. Neck: No JVD or HJR. Lungs: Clear to auscultation bilaterally without wheezes or crackles. Heart: Irregularly irregular with 1/6 systolic murmur.  No rubs or gallops. Abdomen: Soft, nontender, nondistended. Extremities: No lower extremity edema.  EKG: Atrial fibrillation with borderline LVH and nonspecific T wave changes.  Mild QT prolongation (QTc 482 ms).  Compared with prior tracing from 07/17/2019, atrial fibrillation has replaced sinus rhythm.  QT is also now mildly prolonged.  Lab Results  Component Value Date   WBC 5.5 07/14/2019   HGB 14.3 07/14/2019   HCT 45.5 07/14/2019   MCV 89.7 07/14/2019   PLT 184 07/14/2019    Lab Results  Component Value Date   NA 142 07/14/2019   K 4.2 07/14/2019   CL 104 07/14/2019   CO2 27 07/14/2019   BUN 20 07/14/2019   CREATININE 1.01 (H) 07/14/2019   GLUCOSE 85 07/14/2019   ALT 18 12/12/2017    No results found for: CHOL, HDL, LDLCALC, LDLDIRECT, TRIG, CHOLHDL  --------------------------------------------------------------------------------------------------  ASSESSMENT AND PLAN: Persistent atrial fibrillation: Heart rate remains well controlled with minimal symptoms.  It is hard to know if lightheadedness is related to atrial fibrillation.  Monitor last year was notable for pauses of up to 3.3 seconds.  Unfortunately, she did not maintain sinus rhythm following cardioversion.  She was scheduled for GXT in anticipation of possible antiarrhythmic therapy, though this was aborted and no further EP recommendations made.  I will continue indefinite anticoagulation with warfarin and reach out to Dr. Caryl Comes regarding further EP recommendations.  Sarah Sweeney is not on any AV nodal blocking agents given history of bradycardia and pauses.  Hypertrophic cardiomyopathy: Lightheadedness could be caused by or exacerbated by hypertrophic cardiomyopathy, particularly if Sarah Sweeney is having episodes of rapid ventricular response or  becoming dehydrated.  I have encouraged her to stay well-hydrated.  She does not have children or siblings that would need further screening.  I will see if Dr. Caryl Comes has any further recommendations; thus far, Sarah Sweeney does not have any high risk features that I can identify that would warrant ICD placement.  Hypertension: Blood pressure borderline elevated today.  No medication changes at this time.  Follow-up: Return to clinic in 6 months.  Nelva Bush, MD 10/24/2019 7:01 PM

## 2019-10-24 ENCOUNTER — Encounter: Payer: Self-pay | Admitting: Internal Medicine

## 2019-10-24 DIAGNOSIS — I422 Other hypertrophic cardiomyopathy: Secondary | ICD-10-CM | POA: Insufficient documentation

## 2019-10-24 DIAGNOSIS — I4819 Other persistent atrial fibrillation: Secondary | ICD-10-CM | POA: Insufficient documentation

## 2019-10-29 ENCOUNTER — Other Ambulatory Visit: Payer: Self-pay

## 2019-10-29 MED ORDER — ATORVASTATIN CALCIUM 20 MG PO TABS: 20.0000 mg | ORAL_TABLET | Freq: Every day | ORAL | 1 refills | Status: DC

## 2019-11-04 ENCOUNTER — Ambulatory Visit: Payer: Medicare Other

## 2019-11-04 ENCOUNTER — Other Ambulatory Visit: Payer: Self-pay

## 2019-11-04 DIAGNOSIS — G4733 Obstructive sleep apnea (adult) (pediatric): Secondary | ICD-10-CM | POA: Diagnosis not present

## 2019-11-04 DIAGNOSIS — R0683 Snoring: Secondary | ICD-10-CM

## 2019-11-18 ENCOUNTER — Telehealth: Payer: Self-pay | Admitting: Pulmonary Disease

## 2019-11-18 DIAGNOSIS — G4733 Obstructive sleep apnea (adult) (pediatric): Secondary | ICD-10-CM

## 2019-11-18 NOTE — Telephone Encounter (Signed)
HST 11/04/19 >> AHI 35.7, SpO2 low 81%   Please inform her that her sleep study shows severe obstructive sleep apnea.  Please arrange for ROV with me or NP to discuss treatment options.

## 2019-11-19 NOTE — Telephone Encounter (Signed)
Left message for patient to call back.   Will route results to myself for follow up.

## 2019-11-26 NOTE — Telephone Encounter (Signed)
Left message for patient to call back  

## 2019-12-04 NOTE — Telephone Encounter (Signed)
Left message for patient to call back.   Will send letter to patient as this was the 3rd attempt to reach her.

## 2019-12-10 NOTE — Telephone Encounter (Signed)
ATC patient as requested before 2pm. LMTCB.

## 2019-12-10 NOTE — Telephone Encounter (Signed)
Pt called back-- please return call at or after 4 pm

## 2019-12-11 NOTE — Telephone Encounter (Signed)
Pt called back please call back  °

## 2019-12-12 NOTE — Telephone Encounter (Signed)
ATC patient. We have attempted this several times and have sent her a letter. When patient is called, we do not get an answer. This message will be closed as we cannot reach patient and its been several attempts.

## 2019-12-12 NOTE — Telephone Encounter (Signed)
Pt calling about sleep test results.  [phone].

## 2019-12-15 ENCOUNTER — Telehealth: Payer: Self-pay | Admitting: Pulmonary Disease

## 2019-12-15 NOTE — Telephone Encounter (Signed)
Coralyn Helling, MD  6:26 PM Note   HST 11/04/19 >> AHI 35.7, SpO2 low 81%   Please inform her that her sleep study shows severe obstructive sleep apnea.  Please arrange for ROV with me or NP to discuss treatment options.     Called and spoke with pt letting her know the results of HST per VS and stated to her he said to arrange an OV to discuss tx options. Pt verbalized understanding. televisit scheduled for pt Wed. 5/5 with Beth at 1:30.  Nothing further needed.

## 2019-12-17 ENCOUNTER — Encounter: Payer: Self-pay | Admitting: Primary Care

## 2019-12-17 ENCOUNTER — Other Ambulatory Visit: Payer: Self-pay

## 2019-12-17 ENCOUNTER — Ambulatory Visit (INDEPENDENT_AMBULATORY_CARE_PROVIDER_SITE_OTHER): Payer: Medicare Other | Admitting: Primary Care

## 2019-12-17 DIAGNOSIS — G4733 Obstructive sleep apnea (adult) (pediatric): Secondary | ICD-10-CM | POA: Diagnosis not present

## 2019-12-17 NOTE — Progress Notes (Signed)
Virtual Visit via Telephone Note  I connected with Sarah Sweeney on 12/17/19 at  1:30 PM EDT by telephone and verified that I am speaking with the correct person using two identifiers.  Location: Patient: Home Provider: Home   I discussed the limitations, risks, security and privacy concerns of performing an evaluation and management service by telephone and the availability of in person appointments. I also discussed with the patient that there may be a patient responsible charge related to this service. The patient expressed understanding and agreed to proceed.   History of Present Illness: 79 year old female, never smoked. PMH significant for hypertension, afib, cardiomyopathy, GERD, DM. Patient of Dr. Craige Cotta, seen for initial sleep consult on 09/24/19 for snoring and sleep disruption. She has a family history of sleep apnea. [place] 1/24. Ordered from HST.  12/17/2019  Patient contacted today for televisit to review home sleep test results. HST showed severe obstructive sleep apnea, AHI 35.7/hr. Reviewed treatment options including weight loss, side sleeping position, oral appliance, CPAP therapy and referral to ENT. Recommending CPAP therapy d/t severe OSA. Patient agreeing to treatment.    Observations/Objective:  Able to speak in full sentences; no overt shortness of breath, wheezing, cough or stridor   Cardiac: Echo 03/19/19 >> EF 50 to 55%, severe LVH, mild/mod MR  Imaging: CT chest 10/18/15 >> LLL nodule stable since 2014, coronary calcification, cholelithiasis  Assessment and Plan:  Severe obstructive sleep apnea: - HST 11/04/19 AHI 35.7, SpO2 low 81% - Reviewed treatment options for OSA and consequences of untreated sleep apnea  - Referral for new CPAP start; auto titrate 5-15cm h20, mask of choice, supplies, humidification and enroll in [place] - Advised patient to aim to wear CPAP every night for 4-6 hours or more - Do not drive if experiencing excessive daytime fatigue or  somnolence - Encourage weight loss and side sleeping position   Follow Up Instructions:  6-8 weeks follow-up in [place] for OSA review and CPAP compliance check    I discussed the assessment and treatment plan with the patient. The patient was provided an opportunity to ask questions and all were answered. The patient agreed with the plan and demonstrated an understanding of the instructions.   The patient was advised to call back or seek an in-person evaluation if the symptoms worsen or if the condition fails to improve as anticipated.  I provided 18 minutes of non-face-to-face time during this encounter.   Glenford Bayley, NP

## 2019-12-17 NOTE — Patient Instructions (Addendum)
Recommendations: Start CPAP therapy Aim to Wear CPAP for 4-6 hours or more each night Do not drive if experiencing excessive daytime fatigue or somnolence  Orders: New CPAP start: auto titrate 5-15cm h20, mask of choice, supplies, humidification, enroll in [place]    Follow-up: 6-8 weeks with APP in [place] or televisit    CPAP and BPAP Information CPAP and BPAP are methods of helping a person breathe with the use of air pressure. CPAP stands for "continuous positive airway pressure." BPAP stands for "bi-level positive airway pressure." In both methods, air is blown through your nose or mouth and into your air passages to help you breathe well. CPAP and BPAP use different amounts of pressure to blow air. With CPAP, the amount of pressure stays the same while you breathe in and out. With BPAP, the amount of pressure is increased when you breathe in (inhale) so that you can take larger breaths. Your health care provider will recommend whether CPAP or BPAP would be more helpful for you. Why are CPAP and BPAP treatments used? CPAP or BPAP can be helpful if you have:  Sleep apnea.  Chronic obstructive pulmonary disease (COPD).  Heart failure.  Medical conditions that weaken the muscles of the chest including muscular dystrophy, or neurological diseases such as amyotrophic lateral sclerosis (ALS).  Other problems that cause breathing to be weak, abnormal, or difficult. CPAP is most commonly used for obstructive sleep apnea (OSA) to keep the airways from collapsing when the muscles relax during sleep. How is CPAP or BPAP administered? Both CPAP and BPAP are provided by a small machine with a flexible plastic tube that attaches to a plastic mask. You wear the mask. Air is blown through the mask into your nose or mouth. The amount of pressure that is used to blow the air can be adjusted on the machine. Your health care provider will determine the pressure setting that should be used based on  your individual needs. When should CPAP or BPAP be used? In most cases, the mask only needs to be worn during sleep. Generally, the mask needs to be worn throughout the night and during any daytime naps. People with certain medical conditions may also need to wear the mask at other times when they are awake. Follow instructions from your health care provider about when to use the machine. What are some tips for using the mask?   Because the mask needs to be snug, some people feel trapped or closed-in (claustrophobic) when first using the mask. If you feel this way, you may need to get used to the mask. One way to do this is by holding the mask loosely over your nose or mouth and then gradually applying the mask more snugly. You can also gradually increase the amount of time that you use the mask.  Masks are available in various types and sizes. Some fit over your mouth and nose while others fit over just your nose. If your mask does not fit well, talk with your health care provider about getting a different one.  If you are using a mask that fits over your nose and you tend to breathe through your mouth, a chin strap may be applied to help keep your mouth closed.  The CPAP and BPAP machines have alarms that may sound if the mask comes off or develops a leak.  If you have trouble with the mask, it is very important that you talk with your health care provider about finding a way  to make the mask easier to tolerate. Do not stop using the mask. Stopping the use of the mask could have a negative impact on your health. What are some tips for using the machine?  Place your CPAP or BPAP machine on a secure table or stand near an electrical outlet.  Know where the on/off switch is located on the machine.  Follow instructions from your health care provider about how to set the pressure on your machine and when you should use it.  Do not eat or drink while the CPAP or BPAP machine is on. Food or fluids  could get pushed into your lungs by the pressure of the CPAP or BPAP.  Do not smoke. Tobacco smoke residue can damage the machine.  For home use, CPAP and BPAP machines can be rented or purchased through home health care companies. Many different brands of machines are available. Renting a machine before purchasing may help you find out which particular machine works well for you.  Keep the CPAP or BPAP machine and attachments clean. Ask your health care provider for specific instructions. Get help right away if:  You have redness or open areas around your nose or mouth where the mask fits.  You have trouble using the CPAP or BPAP machine.  You cannot tolerate wearing the CPAP or BPAP mask.  You have pain, discomfort, and bloating in your abdomen. Summary  CPAP and BPAP are methods of helping a person breathe with the use of air pressure.  Both CPAP and BPAP are provided by a small machine with a flexible plastic tube that attaches to a plastic mask.  If you have trouble with the mask, it is very important that you talk with your health care provider about finding a way to make the mask easier to tolerate. This information is not intended to replace advice given to you by your health care provider. Make sure you discuss any questions you have with your health care provider. Document Revised: 11/20/2018 Document Reviewed: 06/19/2016 Elsevier Patient Education  [place].

## 2019-12-18 NOTE — Progress Notes (Signed)
Reviewed and agree with assessment/plan.   Kolette Vey, MD [facility] 08/09/2016, 12:24 PM Pager:  [phone]  

## 2019-12-18 NOTE — Addendum Note (Signed)
Addended by: Lanna Poche on: 12/18/2019 04:58 PM   Modules accepted: Orders

## 2019-12-23 ENCOUNTER — Other Ambulatory Visit: Payer: Self-pay | Admitting: Infectious Diseases

## 2019-12-23 DIAGNOSIS — Z1231 Encounter for screening mammogram for malignant neoplasm of breast: Secondary | ICD-10-CM

## 2020-03-03 ENCOUNTER — Other Ambulatory Visit: Payer: Self-pay | Admitting: Internal Medicine

## 2020-04-15 ENCOUNTER — Ambulatory Visit
Admission: RE | Admit: 2020-04-15 | Discharge: 2020-04-15 | Disposition: A | Discharge status: Home / Self Care | Payer: Medicare Other | Source: Ambulatory Visit | Attending: Physician Assistant | Admitting: Physician Assistant

## 2020-04-15 ENCOUNTER — Other Ambulatory Visit: Payer: Self-pay | Admitting: Physician Assistant

## 2020-04-15 ENCOUNTER — Other Ambulatory Visit: Payer: Self-pay

## 2020-04-15 DIAGNOSIS — M7989 Other specified soft tissue disorders: Secondary | ICD-10-CM

## 2020-04-27 ENCOUNTER — Encounter: Payer: Self-pay | Admitting: Pulmonary Disease

## 2020-04-27 ENCOUNTER — Ambulatory Visit: Payer: Medicare Other | Admitting: Pulmonary Disease

## 2020-04-27 ENCOUNTER — Other Ambulatory Visit: Payer: Self-pay

## 2020-04-27 VITALS — BP 110/70 | HR 61 | Temp 97.3°F | Ht 65.0 in | Wt 158.4 lb

## 2020-04-27 DIAGNOSIS — I422 Other hypertrophic cardiomyopathy: Secondary | ICD-10-CM

## 2020-04-27 DIAGNOSIS — G4733 Obstructive sleep apnea (adult) (pediatric): Secondary | ICD-10-CM | POA: Diagnosis not present

## 2020-04-27 DIAGNOSIS — I4819 Other persistent atrial fibrillation: Secondary | ICD-10-CM | POA: Diagnosis not present

## 2020-04-27 DIAGNOSIS — R911 Solitary pulmonary nodule: Secondary | ICD-10-CM

## 2020-04-27 DIAGNOSIS — Z0000 Encounter for general adult medical examination without abnormal findings: Secondary | ICD-10-CM

## 2020-04-27 NOTE — Assessment & Plan Note (Signed)
Plan: Continue follow-up with cardiology 

## 2020-04-27 NOTE — Patient Instructions (Addendum)
You were seen today by Coral Ceo, NP  for:   1. 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  If you change your mind or if you have additional support that may be able to help you and you are able to complete a CPAP titration please contact our office and we can get this ordered   We recommend that you continue using your CPAP daily >>>Keep up the hard work using your device >>> Goal should be wearing this for the entire night that you are sleeping, at least 4 to 6 hours  Remember:  . Do not drive or operate heavy machinery if tired or drowsy.  . Please notify the supply company and office if you are unable to use your device regularly due to missing supplies or machine being broken.  . Work on maintaining a healthy weight and following your recommended nutrition plan  . Maintain proper daily exercise and movement  . Maintaining proper use of your device can also help improve management of other chronic illnesses such as: Blood pressure, blood sugars, and weight management.   BiPAP/ CPAP Cleaning:  >>>Clean weekly, with Dawn soap, and bottle brush.  Set up to air dry. >>> Wipe mask out daily with wet wipe or towelette    2. Healthcare maintenance  We recommend obtaining the seasonal flu vaccine in fall/2021 when available  Follow Up:    Return in about 6 months (around 10/25/2020), or if symptoms worsen or fail to improve, for [facility].   Notification of test results are managed in the following manner: If there are  any recommendations or changes to the   plan of care discussed in office today,  we will contact you and let you know what they are. If you do not hear from [place], then your results are normal and you can view them through your  MyChart account , or a letter will be sent to you. Thank you again for trusting [place] with your care  - Thank you, [facility]    It is flu season:   >>> Best ways to protect herself from the flu: Receive the yearly flu vaccine, practice good hand hygiene washing with soap and also using hand sanitizer when available, eat a nutritious meals, get adequate rest, hydrate appropriately       Please contact the office if your symptoms worsen or you have concerns that you are not improving.   Thank you for choosing [facility] for your healthcare, and for allowing [place] to partner with you on your healthcare journey. I am thankful to be able to provide care to you today.   Elisha Headland FNP-C

## 2020-04-27 NOTE — Assessment & Plan Note (Signed)
History of left lower lobe lung nodules, stable on 2017 CT chest in comparison to 2014 CT chest  Plan: Continue to clinically monitor

## 2020-04-27 NOTE — Assessment & Plan Note (Signed)
Discussion: Reviewed with patient that her home sleep study from March/2021 showed severe obstructive sleep apnea.  Reviewed CPAP compliance report with patient.  Patient's AHI is still slightly elevated at 6.9.  Offered to patient that we could consider a CPAP titration but this would require an in lab study.  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  Since she is feeling clinical relief I do believe it is reasonable for [place] to delay this given her additional role as primary caregiver for her aging mother.  Plan: Continue CPAP therapy Patient knows to contact her office if she is able to complete a CPAP titration We will see patient back in 6 months to check CPAP compliance as well as reassess obstructive sleep apnea control Order to DME company to see what protective equipment could be provided for the patient to help prevent skin breakdown Patient can also use Aquaphor in the meantime as a skin protectant

## 2020-04-27 NOTE — Assessment & Plan Note (Signed)
Plan: Obtain seasonal flu vaccine in fall/2021 

## 2020-04-27 NOTE — Progress Notes (Signed)
Reviewed and agree with assessment/plan.   Coralyn Helling, MD [facility] 04/27/2020, 11:14 AM Pager:  [phone]

## 2020-04-27 NOTE — Progress Notes (Signed)
@Patient  ID: , female    DOB: 15-Dec-1940, 79 y.o.   MRN: [id number]  Chief Complaint  Patient presents with  . Follow-up    Patient states that she is still trying to get used to CPAP machine states "I keep getting sad faces"     Referring provider: [id number], MD  HPI:  79 year old female never smoker followed in our office for severe obstructive sleep apnea  PMH: Type 2 diabetes, GERD, hypertension, history of PE, hypertrophic cardiomyopathy, A. fib Smoker/ Smoking History: Never smoker Maintenance:   Pt of: Dr. Stood  04/27/2020  - Visit   79 year old female never smoker followed in our office for severe obstructive sleep apnea.  She was last seen virtually in May/2021.  Plan of care at that point time was to start CPAP therapy and to have a follow-up in 6 to 8 weeks for CPAP compliance check.  Patient was previously referred to our office in February/2021 to be evaluated for sleep apnea she is established with Dr. March/2021.  Patient CPAP compliance report shows excellent compliance as well as well-controlled AHI.  See compliance report listed below:  03/28/2020-04/26/2020-30 days had a last 30 days use, all 30 those days greater than 4 hours, average usage 6 hours and 44 minutes, APAP setting 5-15, 95th percentile 14, AHI 6.9, apnea index central 1.3, obstructive 2.3, unknown 2.8  Overall patient has tolerated CPAP well.  She denies any feeling of her mask leaking.  She feels that she is sleeping well and she has less daytime fatigue.  She still has some fatigue throughout the day but she attributes this more to being the primary caregiver for her mother who lives with her who has dementia.  She does admit that sometimes she gets frowning faces when she assesses her CPAP use.  We will discuss and review this today.  Patient also reporting some minor redness on her face from her CPAP straps.  She is using a nasal mask.  She is wondering what can be done for this.  We  will discuss this today.  Her DME company is [employer].  Questionaires / Pulmonary Flowsheets:   ACT:  No flowsheet data found.  MMRC: No flowsheet data found.  Epworth:  Results of the Epworth flowsheet 09/24/2019  Sitting and reading 1  Watching TV 0  Sitting, inactive in a public place (e.g. a theatre or a meeting) 0  As a passenger in a car for an hour without a break 0  Lying down to rest in the afternoon when circumstances permit 0  Sitting and talking to someone 0  Sitting quietly after a lunch without alcohol 0  In a car, while stopped for a few minutes in traffic 0  Total score 1    Tests:   Cardiac: Echo 03/19/19 >> EF 50 to 55%, severe LVH, mild/mod MR  Imaging: CT chest 10/18/15 >> LLL nodule stable since 2014, coronary calcification, cholelithiasis  11/04/2019-home sleep study-AHI 35.7, SaO2 low 81%  FENO:  No results found for: NITRICOXIDE  PFT: No flowsheet data found.  WALK:  No flowsheet data found.  Imaging: 11/06/2019 Venous Img Lower Unilateral Left (DVT)  Result Date: 04/15/2020 CLINICAL DATA:  Left lower extremity edema for the past week. History of DVT and pulmonary embolism. Evaluate for acute or chronic DVT. EXAM: LEFT LOWER EXTREMITY VENOUS DOPPLER ULTRASOUND TECHNIQUE: Gray-scale sonography with graded compression, as well as color Doppler and duplex ultrasound were performed to evaluate the lower extremity  deep venous systems from the level of the common femoral vein and including the common femoral, femoral, profunda femoral, popliteal and calf veins including the posterior tibial, peroneal and gastrocnemius veins when visible. The superficial great saphenous vein was also interrogated. Spectral Doppler was utilized to evaluate flow at rest and with distal augmentation maneuvers in the common femoral, femoral and popliteal veins. COMPARISON:  Left lower extremity venous Doppler ultrasound-05/04/2012 positive for nonocclusive thrombus involving left greater  saphenous vein. FINDINGS: Contralateral Common Femoral Vein: Respiratory phasicity is normal and symmetric with the symptomatic side. No evidence of thrombus. Normal compressibility. Common Femoral Vein: No evidence of thrombus. Normal compressibility, respiratory phasicity and response to augmentation. Saphenofemoral Junction: No evidence of thrombus. Normal compressibility and flow on color Doppler imaging. Profunda Femoral Vein: No evidence of thrombus. Normal compressibility and flow on color Doppler imaging. Femoral Vein: No evidence of thrombus. Normal compressibility, respiratory phasicity and response to augmentation. Popliteal Vein: No evidence of thrombus. Normal compressibility, respiratory phasicity and response to augmentation. Calf Veins: No evidence of thrombus. Normal compressibility and flow on color Doppler imaging. Superficial Great Saphenous Vein: No evidence of acute or chronic thrombus. Normal compressibility. Venous Reflux:  None. Other Findings: Note is made of several prominent though widely patent superficial varicosities at the level of the left calf (images 40 and 41). Minimal amount of subcutaneous edema is noted at the level of the left calf. IMPRESSION: 1. No evidence of acute or chronic DVT or SVT within the left lower extremity. 2. Note is made of several prominent though widely patent superficial varicosities at the level of the left calf. Electronically Signed   By: Simonne ComeJohn  Watts M.D.   On: 04/15/2020 16:43    Lab Results:  CBC    Component Value Date/Time   WBC 5.5 07/14/2019 1320   RBC 5.07 07/14/2019 1320   HGB 14.3 07/14/2019 1320   HGB 15.2 06/05/2019 1118   HCT 45.5 07/14/2019 1320   HCT 47.5 (H) 06/05/2019 1118   PLT 184 07/14/2019 1320   PLT 188 06/05/2019 1118   MCV 89.7 07/14/2019 1320   MCV 87 06/05/2019 1118   MCV 80 11/10/2014 1348   MCH 28.2 07/14/2019 1320   MCHC 31.4 07/14/2019 1320   RDW 13.6 07/14/2019 1320   RDW 13.0 06/05/2019 1118   RDW  15.2 (H) 11/10/2014 1348   LYMPHSABS 1.5 07/14/2019 1320   LYMPHSABS 1.6 06/05/2019 1118   LYMPHSABS 1.6 11/10/2014 1348   MONOABS 0.6 07/14/2019 1320   MONOABS 0.6 11/10/2014 1348   EOSABS 0.2 07/14/2019 1320   EOSABS 0.2 06/05/2019 1118   EOSABS 0.3 11/10/2014 1348   BASOSABS 0.0 07/14/2019 1320   BASOSABS 0.1 06/05/2019 1118   BASOSABS 0.1 11/10/2014 1348    BMET    Component Value Date/Time   NA 142 07/14/2019 1320   NA 142 06/05/2019 1118   NA 144 05/06/2012 0337   K 4.2 07/14/2019 1320   K 4.3 05/06/2012 0337   CL 104 07/14/2019 1320   CL 111 (H) 05/06/2012 0337   CO2 27 07/14/2019 1320   CO2 25 05/06/2012 0337   GLUCOSE 85 07/14/2019 1320   GLUCOSE 122 (H) 05/06/2012 0337   BUN 20 07/14/2019 1320   BUN 18 06/05/2019 1118   BUN 18 05/06/2012 0337   CREATININE 1.01 (H) 07/14/2019 1320   CREATININE 0.94 11/10/2014 1348   CALCIUM 9.3 07/14/2019 1320   CALCIUM 8.3 (L) 05/06/2012 0337   GFRNONAA 54 (L) 07/14/2019 1320  GFRNONAA >60 11/10/2014 1348   GFRAA >60 07/14/2019 1320   GFRAA >60 11/10/2014 1348    BNP    Component Value Date/Time   BNP 7,182 (H) 05/04/2012 1135    ProBNP No results found for: PROBNP  Specialty Problems      Pulmonary Problems   Cough   Lung nodule   Severe obstructive sleep apnea      No Known Allergies  Immunization History  Administered Date(s) Administered  . Influenza Split 05/21/2015  . Influenza,inj,Quad PF,6+ Mos 04/30/2019  . Influenza-Unspecified 05/15/2014, 05/05/2016, 05/04/2017, 05/17/2018, 04/30/2019  . Moderna SARS-COVID-2 Vaccination 08/27/2019, 09/24/2019  . Pneumococcal Conjugate-13 10/29/2014  . Pneumococcal Polysaccharide-23 06/19/2012  . Tdap 04/27/2014  . Zoster 06/16/2013  . Zoster Recombinat (Shingrix) 06/08/2018, 08/12/2018    Past Medical History:  Diagnosis Date  . Allergy   . Apical variant hypertrophic cardiomyopathy (HCC)   . Diabetes mellitus without complication (HCC)   .  Erythrocytosis 03/02/2015  . GERD (gastroesophageal reflux disease)   . Headache   . Hypertension   . Pulmonary embolism (HCC)    Coumadin Therapy    Tobacco History: Social History   Tobacco Use  Smoking Status Never Smoker  Smokeless Tobacco Never Used   Counseling given: Yes   Continue to not smoke  Outpatient Encounter Medications as of 04/27/2020  Medication Sig  . atorvastatin (LIPITOR) 20 MG tablet TAKE 1 TABLET BY MOUTH  DAILY  . latanoprost (XALATAN) 0.005 % ophthalmic solution Apply 1 drop to eye at bedtime.   Marland Kitchen losartan-hydrochlorothiazide (HYZAAR) 50-12.5 MG tablet Take 1 tablet by mouth daily.   Marland Kitchen omeprazole (PRILOSEC) 40 MG capsule Take 40 mg by mouth daily.   Marland Kitchen oxybutynin (DITROPAN XL) 15 MG 24 hr tablet Take 15 mg by mouth at bedtime.   Marland Kitchen warfarin (COUMADIN) 1 MG tablet Take 1 tablet (1 mg total) by mouth daily. (Patient taking differently: Take 2-3 mg by mouth See admin instructions. Take  3 mg on Monday and Friday Take 2 mg all the other days in the evening)   No facility-administered encounter medications on file as of 04/27/2020.     Review of Systems  Review of Systems  Constitutional: Positive for fatigue. Negative for activity change and fever.  HENT: Negative for sinus pressure, sinus pain and sore throat.   Respiratory: Negative for cough, shortness of breath and wheezing.   Cardiovascular: Negative for chest pain and palpitations.  Gastrointestinal: Negative for diarrhea, nausea and vomiting.  Musculoskeletal: Negative for arthralgias.  Neurological: Negative for dizziness.  Psychiatric/Behavioral: Negative for sleep disturbance. The patient is not nervous/anxious.      Physical Exam  BP 110/70 (BP Location: Left Arm, Patient Position: Sitting, Cuff Size: Normal)   Pulse 61   Temp (!) 97.3 F (36.3 C) (Temporal)   Ht 5\' 5"  (1.651 m)   Wt 158 lb 6.4 oz (71.8 kg)   SpO2 98%   BMI 26.36 kg/m   Wt Readings from Last 5 Encounters:    04/27/20 158 lb 6.4 oz (71.8 kg)  10/23/19 165 lb 8 oz (75.1 kg)  09/24/19 163 lb 6.4 oz (74.1 kg)  07/17/19 166 lb (75.3 kg)  06/05/19 165 lb 4 oz (75 kg)    BMI Readings from Last 5 Encounters:  04/27/20 26.36 kg/m  10/23/19 28.41 kg/m  09/24/19 28.05 kg/m  07/17/19 27.62 kg/m  06/05/19 27.50 kg/m     Physical Exam Vitals and nursing note reviewed.  Constitutional:      General:  She is not in acute distress.    Appearance: Normal appearance. She is normal weight.  HENT:     Head: Normocephalic and atraumatic.     Right Ear: External ear normal.     Left Ear: External ear normal.     Nose: Congestion present.     Mouth/Throat:     Mouth: Mucous membranes are moist.     Pharynx: Oropharynx is clear.  Eyes:     Pupils: Pupils are equal, round, and reactive to light.  Cardiovascular:     Rate and Rhythm: Normal rate. Rhythm regularly irregular.     Pulses: Normal pulses.     Heart sounds: Normal heart sounds. No murmur heard.   Pulmonary:     Effort: Pulmonary effort is normal. No respiratory distress.     Breath sounds: Normal breath sounds. No decreased air movement. No decreased breath sounds, wheezing or rales.  Musculoskeletal:     Cervical back: Normal range of motion.  Skin:    General: Skin is warm and dry.     Capillary Refill: Capillary refill takes less than 2 seconds.     Findings: Erythema (slight redness on face from cpap straps) present.  Neurological:     General: No focal deficit present.     Mental Status: She is alert and oriented to person, place, and time. Mental status is at baseline.     Gait: Gait normal.  Psychiatric:        Mood and Affect: Mood normal.        Behavior: Behavior normal.        Thought Content: Thought content normal.        Judgment: Judgment normal.       Assessment & Plan:   Severe obstructive sleep apnea Discussion: Reviewed with patient that her home sleep study from March/2021 showed severe obstructive  sleep apnea.  Reviewed CPAP compliance report with patient.  Patient's AHI is still slightly elevated at 6.9.  Offered to patient that we could consider a CPAP titration but this would require an in lab study.  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  Since she is feeling clinical relief I do believe it is reasonable for [place] to delay this given her additional role as primary caregiver for her aging mother.  Plan: Continue CPAP therapy Patient knows to contact her office if she is able to complete a CPAP titration We will see patient back in 6 months to check CPAP compliance as well as reassess obstructive sleep apnea control Order to DME company to see what protective equipment could be provided for the patient to help prevent skin breakdown Patient can also use Aquaphor in the meantime as a skin protectant  Persistent atrial fibrillation (HCC) Plan: Continue follow-up with cardiology  Hypertrophic cardiomyopathy (HCC) Plan: Continue follow-up with cardiology  Lung nodule History of left lower lobe lung nodules, stable on 2017 CT chest in comparison to 2014 CT chest  Plan: Continue to clinically monitor  Healthcare maintenance Plan: Obtain seasonal flu vaccine in fall/2021    Return in about 6 months (around 10/25/2020), or if symptoms worsen or fail to improve, for [employer]   Coral Ceo, NP 04/27/2020   This appointment required 22 minutes of patient care (this includes precharting, chart review, review of results, face-to-face care, etc.).

## 2020-04-27 NOTE — Addendum Note (Signed)
Addended by: Benjie Karvonen R on: 04/27/2020 11:05 AM   Modules accepted: Orders

## 2020-04-28 ENCOUNTER — Encounter: Payer: Self-pay | Admitting: Internal Medicine

## 2020-04-28 ENCOUNTER — Ambulatory Visit: Payer: Medicare Other | Admitting: Internal Medicine

## 2020-04-28 VITALS — BP 130/60 | HR 44 | Ht 65.0 in | Wt 158.0 lb

## 2020-04-28 DIAGNOSIS — I422 Other hypertrophic cardiomyopathy: Secondary | ICD-10-CM | POA: Diagnosis not present

## 2020-04-28 DIAGNOSIS — I4819 Other persistent atrial fibrillation: Secondary | ICD-10-CM

## 2020-04-28 NOTE — Progress Notes (Addendum)
Follow-up Outpatient Visit Date: 04/28/2020  Primary Care Provider: Mick Sell, MD [address] [place] [address]  Chief Complaint: Follow-up atrial fibrillation  HPI:  Sarah Sweeney is a 79 y.o. 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  I last saw [name] in March, at which time she reported occasional lightheadedness, typically when doing light activities.  Today, [name] reports feeling well.  She continues to experience occasional orthostatic lightheadedness.  She has not passed out or fallen.  She denies chest pain, shortness of breath, palpitations, and edema.  Leg swelling is well-controlled with regular compression stocking use.  She remains compliant with warfarin and has not experienced any bleeding.  --------------------------------------------------------------------------------------------------  Past Medical History:  Diagnosis Date  . Allergy   . Apical variant hypertrophic cardiomyopathy (HCC)   . Diabetes mellitus without complication (HCC)   . Erythrocytosis 03/02/2015  . GERD (gastroesophageal reflux disease)   . Headache   . Hypertension   . Pulmonary embolism (HCC)    Coumadin Therapy   Past Surgical History:  Procedure Laterality Date  . APPENDECTOMY    . CARDIOVERSION N/A 07/17/2019   Procedure: CARDIOVERSION;  Surgeon: Antonieta Iba, MD;  Location: [facility] ORS;  Service: Cardiovascular;  Laterality: N/A;  . COLONOSCOPY WITH PROPOFOL N/A 03/04/2019   Procedure: COLONOSCOPY WITH PROPOFOL;  Surgeon: Toledo, Boykin Nearing, MD;  Location: [facility] ENDOSCOPY;  Service: Gastroenterology;  Laterality: N/A;  .  TONSILLECTOMY      Current Meds  Medication Sig  . atorvastatin (LIPITOR) 20 MG tablet TAKE 1 TABLET BY MOUTH  DAILY  . furosemide (LASIX) 20 MG tablet Take by mouth.  . latanoprost (XALATAN) 0.005 % ophthalmic solution Apply 1 drop to eye at bedtime.   Marland Kitchen losartan-hydrochlorothiazide (HYZAAR) 50-12.5 MG tablet Take 1 tablet by mouth daily.   Marland Kitchen omeprazole (PRILOSEC) 40 MG capsule Take 40 mg by mouth daily.   Marland Kitchen oxybutynin (DITROPAN XL) 15 MG 24 hr tablet Take 15 mg by mouth at bedtime.   Marland Kitchen warfarin (COUMADIN) 2 MG tablet Take one tablet my mouth Sun. Tues. Wed. Thurs. And Sat.  . warfarin (COUMADIN) 3 MG tablet Take one tablet by mouth Monday and Fridays    Allergies: Patient has no known allergies.  Social History   Tobacco Use  . Smoking status: Never Smoker  . Smokeless tobacco: Never Used  Vaping Use  . Vaping Use: Never used  Substance Use Topics  . Alcohol use: No  . Drug use: No    Family History  Problem Relation Age of Onset  . Breast cancer Maternal Aunt        60's  . Cancer Maternal Uncle   . Stroke Maternal Grandfather     Review of Systems: A 12-system review of systems was performed and was negative except as noted in the HPI.  --------------------------------------------------------------------------------------------------  Physical Exam: BP 130/60 (BP Location: Left Arm, Patient Position: Sitting, Cuff Size: Normal)   Pulse (!) 44   Ht 5\' 5"  (1.651 m)   Wt 158 lb (71.7 kg)   BMI 26.29 kg/m   General:  NAD. HEENT: No conjunctival pallor or scleral icterus. Facemask in place. Neck: No JVD or HJR. Lungs: Normal work of breathing. Clear to auscultation bilaterally without wheezes  or crackles. Heart: Irregularly irregular and bradycardic.  No murmurs. Abd: Bowel sounds present. Soft, NT/ND. Ext: No lower extremity edema.  EKG:  Atrial fibrillation with slow ventricular response (ventricular rate 44 bpm).  Lab Results  Component Value Date   WBC  5.5 07/14/2019   HGB 14.3 07/14/2019   HCT 45.5 07/14/2019   MCV 89.7 07/14/2019   PLT 184 07/14/2019    Lab Results  Component Value Date   NA 142 07/14/2019   K 4.2 07/14/2019   CL 104 07/14/2019   CO2 27 07/14/2019   BUN 20 07/14/2019   CREATININE 1.01 (H) 07/14/2019   GLUCOSE 85 07/14/2019   ALT 18 12/12/2017    --------------------------------------------------------------------------------------------------  ASSESSMENT AND PLAN: Atrial fibrillation: Ventricular rates remain slow, though prior event monitor demonstrated reasonable rise in ventricular rates with activity.  Given minimal symptoms (transient orthostatic lightheadedness that has been longstanding), we will forego additional testing and intervention.  She previously saw EP with recommendations for DCCV.  Unfortunately, she did not maintain sinus rhythm very long.  AV nodal blocking agents should continue to be avoided.  We will continue indefinite anticoagulation with warfarin.  If she develops new symptoms, we will refer her back to EP to continue management of her atrial fibrillation.  Apical HCM: No symptoms.  No indication for beta-blockers/calcium channel blockers in the setting of bradycardia.  Follow-up: Return to clinic in 6 months.  Addendum (05/04/20 @ 4:15 PM): Case discussed with the EP.  Given that [name] is minimally symptomatic, we will defer further attempts at rhythm control.  Yvonne Kendall, MD 04/28/2020 3:07 PM

## 2020-04-28 NOTE — Patient Instructions (Signed)

## 2020-08-04 NOTE — Telephone Encounter (Signed)
Closing encounter

## 2020-09-14 ENCOUNTER — Encounter: Payer: Self-pay | Admitting: Ophthalmology

## 2020-09-14 ENCOUNTER — Other Ambulatory Visit: Payer: Self-pay

## 2020-09-17 ENCOUNTER — Other Ambulatory Visit: Payer: Self-pay

## 2020-09-17 ENCOUNTER — Other Ambulatory Visit
Admission: RE | Admit: 2020-09-17 | Discharge: 2020-09-17 | Disposition: A | Discharge status: Home / Self Care | Payer: Medicare Other | Source: Ambulatory Visit | Attending: Ophthalmology | Admitting: Ophthalmology

## 2020-09-17 DIAGNOSIS — Z01812 Encounter for preprocedural laboratory examination: Secondary | ICD-10-CM | POA: Insufficient documentation

## 2020-09-17 DIAGNOSIS — Z20822 Contact with and (suspected) exposure to covid-19: Secondary | ICD-10-CM | POA: Insufficient documentation

## 2020-09-17 NOTE — Discharge Instructions (Signed)

## 2020-09-18 LAB — SARS CORONAVIRUS 2 (TAT 6-24 HRS): SARS Coronavirus 2: NEGATIVE

## 2020-09-21 ENCOUNTER — Other Ambulatory Visit: Payer: Self-pay

## 2020-09-21 ENCOUNTER — Ambulatory Visit: Payer: Medicare Other | Admitting: Anesthesiology

## 2020-09-21 ENCOUNTER — Encounter
Admission: RE | Disposition: A | Discharge status: Home / Self Care | Payer: Self-pay | Source: Home / Self Care | Attending: Ophthalmology

## 2020-09-21 ENCOUNTER — Encounter: Payer: Self-pay | Admitting: Ophthalmology

## 2020-09-21 ENCOUNTER — Ambulatory Visit
Admission: RE | Admit: 2020-09-21 | Discharge: 2020-09-21 | Disposition: A | Discharge status: Home / Self Care | Payer: Medicare Other | Attending: Ophthalmology | Admitting: Ophthalmology

## 2020-09-21 DIAGNOSIS — Z7901 Long term (current) use of anticoagulants: Secondary | ICD-10-CM | POA: Diagnosis not present

## 2020-09-21 DIAGNOSIS — H2512 Age-related nuclear cataract, left eye: Secondary | ICD-10-CM | POA: Insufficient documentation

## 2020-09-21 DIAGNOSIS — Z79899 Other long term (current) drug therapy: Secondary | ICD-10-CM | POA: Diagnosis not present

## 2020-09-21 DIAGNOSIS — G4730 Sleep apnea, unspecified: Secondary | ICD-10-CM | POA: Insufficient documentation

## 2020-09-21 DIAGNOSIS — E1136 Type 2 diabetes mellitus with diabetic cataract: Secondary | ICD-10-CM | POA: Diagnosis not present

## 2020-09-21 HISTORY — PX: CATARACT EXTRACTION W/PHACO: SHX586

## 2020-09-21 HISTORY — DX: Sleep apnea, unspecified: G47.30

## 2020-09-21 SURGERY — PHACOEMULSIFICATION, CATARACT, WITH IOL INSERTION
Anesthesia: Monitor Anesthesia Care | Site: Eye | Laterality: Left

## 2020-09-21 MED ORDER — ARMC OPHTHALMIC DILATING DROPS
1.0000 "application " | OPHTHALMIC | Status: DC | PRN
  Administered 2020-09-21 (×3): 1 via OPHTHALMIC

## 2020-09-21 MED ORDER — LIDOCAINE HCL (PF) 2 % IJ SOLN
INTRAOCULAR | Status: DC | PRN
  Administered 2020-09-21: 1 mL

## 2020-09-21 MED ORDER — MIDAZOLAM HCL 2 MG/2ML IJ SOLN
INTRAMUSCULAR | Status: DC | PRN
  Administered 2020-09-21: 2 mg via INTRAVENOUS

## 2020-09-21 MED ORDER — TETRACAINE HCL 0.5 % OP SOLN
1.0000 [drp] | OPHTHALMIC | Status: DC | PRN
  Administered 2020-09-21 (×3): 1 [drp] via OPHTHALMIC

## 2020-09-21 MED ORDER — NA CHONDROIT SULF-NA HYALURON 40-17 MG/ML IO SOLN
INTRAOCULAR | Status: DC | PRN
  Administered 2020-09-21: 1 mL via INTRAOCULAR

## 2020-09-21 MED ORDER — EPINEPHRINE PF 1 MG/ML IJ SOLN
INTRAOCULAR | Status: DC | PRN
  Administered 2020-09-21: 82 mL via OPHTHALMIC

## 2020-09-21 MED ORDER — FENTANYL CITRATE (PF) 100 MCG/2ML IJ SOLN
INTRAMUSCULAR | Status: DC | PRN
  Administered 2020-09-21: 50 ug via INTRAVENOUS

## 2020-09-21 MED ORDER — BRIMONIDINE TARTRATE-TIMOLOL 0.2-0.5 % OP SOLN
OPHTHALMIC | Status: DC | PRN
  Administered 2020-09-21: 1 [drp] via OPHTHALMIC

## 2020-09-21 MED ORDER — MOXIFLOXACIN HCL 0.5 % OP SOLN
OPHTHALMIC | Status: DC | PRN
  Administered 2020-09-21: 0.2 mL via OPHTHALMIC

## 2020-09-21 SURGICAL SUPPLY — 19 items
CANNULA ANT/CHMB 27G (MISCELLANEOUS) ×2 IMPLANT
CANNULA ANT/CHMB 27GA (MISCELLANEOUS) ×4 IMPLANT
GLOVE SURG LX 8.0 MICRO (GLOVE) ×1
GLOVE SURG LX STRL 8.0 MICRO (GLOVE) ×1 IMPLANT
GLOVE SURG TRIUMPH 8.0 PF LTX (GLOVE) ×2 IMPLANT
GOWN STRL REUS W/ TWL LRG LVL3 (GOWN DISPOSABLE) ×2 IMPLANT
GOWN STRL REUS W/TWL LRG LVL3 (GOWN DISPOSABLE) ×4
LENS IOL TECNIS EYHANCE 12.5 (Intraocular Lens) ×1 IMPLANT
MARKER SKIN DUAL TIP RULER LAB (MISCELLANEOUS) ×2 IMPLANT
NDL FILTER BLUNT 18X1 1/2 (NEEDLE) ×1 IMPLANT
NEEDLE FILTER BLUNT 18X 1/2SAF (NEEDLE) ×1
NEEDLE FILTER BLUNT 18X1 1/2 (NEEDLE) ×1 IMPLANT
PACK EYE AFTER SURG (MISCELLANEOUS) ×2 IMPLANT
PACK OPTHALMIC (MISCELLANEOUS) ×2 IMPLANT
PACK PORFILIO (MISCELLANEOUS) ×2 IMPLANT
SYR 3ML LL SCALE MARK (SYRINGE) ×2 IMPLANT
SYR TB 1ML LUER SLIP (SYRINGE) ×2 IMPLANT
WATER STERILE IRR 250ML POUR (IV SOLUTION) ×2 IMPLANT
WIPE NON LINTING 3.25X3.25 (MISCELLANEOUS) ×2 IMPLANT

## 2020-09-21 NOTE — Anesthesia Postprocedure Evaluation (Signed)
Anesthesia Post Note  Patient: Sarah Sweeney  Procedure(s) Performed: CATARACT EXTRACTION PHACO AND INTRAOCULAR LENS PLACEMENT (IOC) LEFT 4.94 00:36.2 (Left Eye)     Patient location during evaluation: PACU Anesthesia Type: MAC Level of consciousness: awake and alert Pain management: pain level controlled Vital Signs Assessment: post-procedure vital signs reviewed and stable Respiratory status: spontaneous breathing Cardiovascular status: stable Anesthetic complications: no   No complications documented.  Gillian Scarce

## 2020-09-21 NOTE — H&P (Signed)
[facility]   Primary Care Physician:  Mick Sell, MD Ophthalmologist: Dr. Druscilla Brownie  Pre-Procedure History & Physical: HPI:  Sarah Sweeney is a 80 y.o. female here for cataract surgery.   Past Medical History:  Diagnosis Date  . Allergy   . Apical variant hypertrophic cardiomyopathy (HCC)   . Diabetes mellitus without complication (HCC)   . Erythrocytosis 03/02/2015  . GERD (gastroesophageal reflux disease)   . Headache   . Hypertension   . Pulmonary embolism (HCC) 2013   Coumadin Therapy  . Sleep apnea    CPAP    Past Surgical History:  Procedure Laterality Date  . APPENDECTOMY    . CARDIOVERSION N/A 07/17/2019   Procedure: CARDIOVERSION;  Surgeon: Antonieta Iba, MD;  Location: [facility] ORS;  Service: Cardiovascular;  Laterality: N/A;  . COLONOSCOPY WITH PROPOFOL N/A 03/04/2019   Procedure: COLONOSCOPY WITH PROPOFOL;  Surgeon: Toledo, Boykin Nearing, MD;  Location: [facility] ENDOSCOPY;  Service: Gastroenterology;  Laterality: N/A;  . TONSILLECTOMY      Prior to Admission medications   Medication Sig Start Date End Date Taking? Authorizing Provider  atorvastatin (LIPITOR) 20 MG tablet TAKE 1 TABLET BY MOUTH  DAILY 03/03/20  Yes End, Cristal Deer, MD  furosemide (LASIX) 20 MG tablet Take by mouth daily as needed. 04/15/20 04/15/21 Yes [provider]  latanoprost (XALATAN) 0.005 % ophthalmic solution Apply 1 drop to eye at bedtime.  02/02/14  Yes [provider]  losartan-hydrochlorothiazide (HYZAAR) 50-12.5 MG tablet Take 1 tablet by mouth daily.  04/18/19  Yes [provider]  omeprazole (PRILOSEC) 40 MG capsule Take 40 mg by mouth daily.  08/24/15  Yes [provider]  oxybutynin (DITROPAN XL) 15 MG 24 hr tablet Take 15 mg by mouth at bedtime.    Yes [provider]  warfarin (COUMADIN) 2 MG tablet Take one tablet my mouth Sun. Tues. Wed. Thurs. And Sat.   Yes [provider]  warfarin (COUMADIN) 3 MG tablet Take one tablet  by mouth Monday and Fridays   Yes [provider]    Allergies as of 08/03/2020  . (No Known Allergies)    Family History  Problem Relation Age of Onset  . Breast cancer Maternal Aunt        60's  . Cancer Maternal Uncle   . Stroke Maternal Grandfather     Social History   Socioeconomic History  . Marital status: Single    Spouse name: Not on file  . Number of children: Not on file  . Years of education: Not on file  . Highest education level: Not on file  Occupational History  . Not on file  Tobacco Use  . Smoking status: Never Smoker  . Smokeless tobacco: Never Used  Vaping Use  . Vaping Use: Never used  Substance and Sexual Activity  . Alcohol use: No  . Drug use: No  . Sexual activity: Not Currently  Other Topics Concern  . Not on file  Social History Narrative  . Not on file   Social Determinants of Health   Financial Resource Strain: Not on file  Food Insecurity: Not on file  Transportation Needs: Not on file  Physical Activity: Not on file  Stress: Not on file  Social Connections: Not on file  Intimate Partner Violence: Not on file    Review of Systems: See HPI, otherwise negative ROS  Physical Exam: BP (!) 160/74   Pulse 88   Temp (!) 96.7 F (35.9 C) (  Temporal)   Resp 18   Ht 5\' 4"  (1.626 m)   Wt 68 kg   SpO2 98%   BMI 25.75 kg/m  General:   Alert,  pleasant and cooperative in NAD Head:  Normocephalic and atraumatic. Respiratory:  Normal work of breathing.  Impression/Plan: Sarah Sweeney is here for cataract surgery.  Risks, benefits, limitations, and alternatives regarding cataract surgery have been reviewed with the patient.  Questions have been answered.  All parties agreeable.   Courtney Paris, MD  09/21/2020, 11:28 AM

## 2020-09-21 NOTE — Anesthesia Preprocedure Evaluation (Addendum)
Anesthesia Evaluation  Patient identified by MRN, date of birth, ID band Patient awake    Reviewed: Allergy & Precautions, H&P , NPO status , Patient's Chart, lab work & pertinent test results  Airway Mallampati: II  TM Distance: >3 FB Neck ROM: full    Dental no notable dental hx.    Pulmonary sleep apnea ,    Pulmonary exam normal        Cardiovascular hypertension, On Medications Normal cardiovascular exam+ dysrhythmias Atrial Fibrillation  Rhythm:regular Rate:Normal     Neuro/Psych    GI/Hepatic Neg liver ROS, Medicated,  Endo/Other  diabetes, Well Controlled  Renal/GU   negative genitourinary   Musculoskeletal   Abdominal   Peds  Hematology negative hematology ROS (+)   Anesthesia Other Findings   Reproductive/Obstetrics                            Anesthesia Physical Anesthesia Plan  ASA: II  Anesthesia Plan: MAC   Post-op Pain Management:    Induction:   PONV Risk Score and Plan:   Airway Management Planned:   Additional Equipment:   Intra-op Plan:   Post-operative Plan:   Informed Consent: I have reviewed the patients History and Physical, chart, labs and discussed the procedure including the risks, benefits and alternatives for the proposed anesthesia with the patient or authorized representative who has indicated his/her understanding and acceptance.       Plan Discussed with:   Anesthesia Plan Comments:         Anesthesia Quick Evaluation

## 2020-09-21 NOTE — Transfer of Care (Signed)
Immediate Anesthesia Transfer of Care Note  Patient: Sarah Sweeney  Procedure(s) Performed: CATARACT EXTRACTION PHACO AND INTRAOCULAR LENS PLACEMENT (IOC) LEFT 4.94 00:36.2 (Left Eye)  Patient Location: PACU  Anesthesia Type: MAC  Level of Consciousness: awake, alert  and patient cooperative  Airway and Oxygen Therapy: Patient Spontanous Breathing and Patient connected to supplemental oxygen  Post-op Assessment: Post-op Vital signs reviewed, Patient's Cardiovascular Status Stable, Respiratory Function Stable, Patent Airway and No signs of Nausea or vomiting  Post-op Vital Signs: Reviewed and stable  Complications: No complications documented.

## 2020-09-21 NOTE — Op Note (Signed)
PREOPERATIVE DIAGNOSIS:  Nuclear sclerotic cataract of the left eye.   POSTOPERATIVE DIAGNOSIS:  Nuclear sclerotic cataract of the left eye.   OPERATIVE PROCEDURE:@   SURGEON:  Galen Manila, MD.   ANESTHESIA:  Anesthesiologist: Jolayne Panther, MD CRNA: Jimmy Picket, CRNA; Jinny Blossom, CRNA  1.      Managed anesthesia care. 2.     0.36ml of Shugarcaine was instilled following the paracentesis   COMPLICATIONS:  None.   TECHNIQUE:   Stop and chop   DESCRIPTION OF PROCEDURE:  The patient was examined and consented in the preoperative holding area where the aforementioned topical anesthesia was applied to the left eye and then brought back to the Operating Room where the left eye was prepped and draped in the usual sterile ophthalmic fashion and a lid speculum was placed. A paracentesis was created with the side port blade and the anterior chamber was filled with viscoelastic. A near clear corneal incision was performed with the steel keratome. A continuous curvilinear capsulorrhexis was performed with a cystotome followed by the capsulorrhexis forceps. Hydrodissection and hydrodelineation were carried out with BSS on a blunt cannula. The lens was removed in a stop and chop  technique and the remaining cortical material was removed with the irrigation-aspiration handpiece. The capsular bag was inflated with viscoelastic and the Technis [id number] lens was placed in the capsular bag without complication. The remaining viscoelastic was removed from the eye with the irrigation-aspiration handpiece. The wounds were hydrated. The anterior chamber was flushed with BSS and the eye was inflated to physiologic pressure. 0.66ml Vigamox was placed in the anterior chamber. The wounds were found to be water tight. The eye was dressed with Combigan. The patient was given protective glasses to wear throughout the day and a shield with which to sleep tonight. The patient was also given drops with which to  begin a drop regimen today and will follow-up with me in one day. Implant Name Type Inv. Item Serial No. Manufacturer Lot No. LRB No. Used Action  LENS IOL TECNIS EYHANCE 12.5 - [id number] Intraocular Lens LENS IOL TECNIS EYHANCE 12.5 [id number] JOHNSON   Left 1 Implanted    Procedure(s) with comments: CATARACT EXTRACTION PHACO AND INTRAOCULAR LENS PLACEMENT (IOC) LEFT 4.94 00:36.2 (Left) - sleep apnea  Electronically signed: Galen Manila 09/21/2020 11:54 AM

## 2020-09-22 ENCOUNTER — Encounter: Payer: Self-pay | Admitting: Ophthalmology

## 2020-09-28 ENCOUNTER — Other Ambulatory Visit: Payer: Self-pay

## 2020-09-28 ENCOUNTER — Encounter: Payer: Self-pay | Admitting: Ophthalmology

## 2020-09-29 NOTE — Anesthesia Preprocedure Evaluation (Addendum)
Anesthesia Evaluation  Patient identified by MRN, date of birth, ID band Patient awake    Reviewed: Allergy & Precautions, NPO status , Patient's Chart, lab work & pertinent test results  Airway Mallampati: II  TM Distance: >3 FB Neck ROM: Full    Dental no notable dental hx.    Pulmonary sleep apnea ,    Pulmonary exam normal        Cardiovascular hypertension, Pt. on medications Normal cardiovascular exam+ dysrhythmias Atrial Fibrillation   HOCM, Afib.  [place] with no issues.   ECHO 1. The left ventricle has low normal systolic function, with an ejection fraction of 50-55%. The cavity size was mild to moderately dilated. There is severe asymmetric left ventricular hypertrophy of the apical wall.  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 Left ventricular diastolic function could not be evaluated secondary to atrial fibrillation.  2. The right ventricle has low normal systolic function. The cavity was mildly enlarged. There is no increase in right ventricular wall thickness. Right ventricular systolic pressure could not be assessed.  3. Left atrial size was mildly dilated.  4. Right atrial size was mildly dilated.  5. Mitral valve regurgitation is mild to moderate by color flow Doppler.  6. The tricuspid valve is grossly normal.  7. The aortic valve is tricuspid.  8. The aorta is normal in size and structure.  9. The interatrial septum was not well visualized.   Neuro/Psych  Headaches, negative psych ROS   GI/Hepatic GERD  Controlled,  Endo/Other  diabetes  Renal/GU      Musculoskeletal   Abdominal Normal abdominal exam  (+)   Peds  Hematology   Anesthesia Other Findings   Reproductive/Obstetrics                            Anesthesia Physical Anesthesia Plan  ASA: III  Anesthesia Plan: MAC   Post-op Pain Management:     Induction: Intravenous  PONV Risk Score and Plan: 2 and Midazolam, TIVA and Treatment may vary due to age or medical condition  Airway Management Planned: Natural Airway  Additional Equipment: None  Intra-op Plan:   Post-operative Plan:   Informed Consent: I have reviewed the patients History and Physical, chart, labs and discussed the procedure including the risks, benefits and alternatives for the proposed anesthesia with the patient or authorized representative who has indicated his/her understanding and acceptance.     Dental advisory given  Plan Discussed with: CRNA  Anesthesia Plan Comments:         Anesthesia Quick Evaluation                                  Anesthesia Evaluation  Patient identified by MRN, date of birth, ID band Patient awake    Reviewed: Allergy & Precautions, H&P , NPO status , Patient's Chart, lab work & pertinent test results  Airway Mallampati: II  TM Distance: >3 FB Neck ROM: full    Dental no notable dental hx.    Pulmonary sleep apnea ,    Pulmonary exam normal        Cardiovascular hypertension, On Medications Normal cardiovascular exam+ dysrhythmias Atrial Fibrillation  Rhythm:regular Rate:Normal     Neuro/Psych    GI/Hepatic Neg liver ROS, Medicated,  Endo/Other  diabetes, Well Controlled  Renal/GU   negative genitourinary   Musculoskeletal   Abdominal  Peds  Hematology negative hematology ROS (+)   Anesthesia Other Findings   Reproductive/Obstetrics                            Anesthesia Physical Anesthesia Plan  ASA: II  Anesthesia Plan: MAC   Post-op Pain Management:    Induction:   PONV Risk Score and Plan:   Airway Management Planned:   Additional Equipment:   Intra-op Plan:   Post-operative Plan:   Informed Consent: I have reviewed the patients History and Physical, chart, labs and discussed the procedure including the risks, benefits and  alternatives for the proposed anesthesia with the patient or authorized representative who has indicated his/her understanding and acceptance.       Plan Discussed with:   Anesthesia Plan Comments:         Anesthesia Quick Evaluation

## 2020-10-01 ENCOUNTER — Other Ambulatory Visit: Payer: Self-pay

## 2020-10-01 ENCOUNTER — Other Ambulatory Visit
Admission: RE | Admit: 2020-10-01 | Discharge: 2020-10-01 | Disposition: A | Discharge status: Home / Self Care | Payer: Medicare Other | Source: Ambulatory Visit | Attending: Ophthalmology | Admitting: Ophthalmology

## 2020-10-01 DIAGNOSIS — Z01812 Encounter for preprocedural laboratory examination: Secondary | ICD-10-CM | POA: Insufficient documentation

## 2020-10-01 DIAGNOSIS — Z20822 Contact with and (suspected) exposure to covid-19: Secondary | ICD-10-CM | POA: Diagnosis not present

## 2020-10-01 NOTE — Discharge Instructions (Signed)

## 2020-10-02 LAB — SARS CORONAVIRUS 2 (TAT 6-24 HRS): SARS Coronavirus 2: NEGATIVE

## 2020-10-05 ENCOUNTER — Ambulatory Visit: Payer: Medicare Other | Admitting: Anesthesiology

## 2020-10-05 ENCOUNTER — Encounter
Admission: RE | Disposition: A | Discharge status: Home / Self Care | Payer: Self-pay | Source: Home / Self Care | Attending: Ophthalmology

## 2020-10-05 ENCOUNTER — Encounter: Payer: Self-pay | Admitting: Ophthalmology

## 2020-10-05 ENCOUNTER — Other Ambulatory Visit: Payer: Self-pay

## 2020-10-05 ENCOUNTER — Ambulatory Visit
Admission: RE | Admit: 2020-10-05 | Discharge: 2020-10-05 | Disposition: A | Discharge status: Home / Self Care | Payer: Medicare Other | Attending: Ophthalmology | Admitting: Ophthalmology

## 2020-10-05 DIAGNOSIS — H2511 Age-related nuclear cataract, right eye: Secondary | ICD-10-CM | POA: Diagnosis present

## 2020-10-05 DIAGNOSIS — Z7901 Long term (current) use of anticoagulants: Secondary | ICD-10-CM | POA: Diagnosis not present

## 2020-10-05 DIAGNOSIS — Z79899 Other long term (current) drug therapy: Secondary | ICD-10-CM | POA: Diagnosis not present

## 2020-10-05 DIAGNOSIS — E1136 Type 2 diabetes mellitus with diabetic cataract: Secondary | ICD-10-CM | POA: Diagnosis not present

## 2020-10-05 HISTORY — PX: CATARACT EXTRACTION W/PHACO: SHX586

## 2020-10-05 SURGERY — PHACOEMULSIFICATION, CATARACT, WITH IOL INSERTION
Anesthesia: Monitor Anesthesia Care | Site: Eye | Laterality: Right

## 2020-10-05 MED ORDER — GLYCOPYRROLATE 0.2 MG/ML IJ SOLN
INTRAMUSCULAR | Status: DC | PRN
  Administered 2020-10-05 (×2): .1 mg via INTRAVENOUS

## 2020-10-05 MED ORDER — LACTATED RINGERS IV SOLN: INTRAVENOUS | Status: DC

## 2020-10-05 MED ORDER — ARMC OPHTHALMIC DILATING DROPS
1.0000 "application " | OPHTHALMIC | Status: DC | PRN
  Administered 2020-10-05 (×3): 1 via OPHTHALMIC

## 2020-10-05 MED ORDER — MOXIFLOXACIN HCL 0.5 % OP SOLN
OPHTHALMIC | Status: DC | PRN
  Administered 2020-10-05: 0.2 mL via OPHTHALMIC

## 2020-10-05 MED ORDER — FENTANYL CITRATE (PF) 100 MCG/2ML IJ SOLN
INTRAMUSCULAR | Status: DC | PRN
  Administered 2020-10-05: 50 ug via INTRAVENOUS

## 2020-10-05 MED ORDER — TETRACAINE HCL 0.5 % OP SOLN
1.0000 [drp] | OPHTHALMIC | Status: DC | PRN
  Administered 2020-10-05 (×3): 1 [drp] via OPHTHALMIC

## 2020-10-05 MED ORDER — MIDAZOLAM HCL 2 MG/2ML IJ SOLN
INTRAMUSCULAR | Status: DC | PRN
  Administered 2020-10-05: 1 mg via INTRAVENOUS

## 2020-10-05 MED ORDER — LIDOCAINE HCL (PF) 2 % IJ SOLN
INTRAOCULAR | Status: DC | PRN
  Administered 2020-10-05: 1 mL

## 2020-10-05 MED ORDER — EPINEPHRINE PF 1 MG/ML IJ SOLN
INTRAOCULAR | Status: DC | PRN
  Administered 2020-10-05: 70 mL via OPHTHALMIC

## 2020-10-05 MED ORDER — BRIMONIDINE TARTRATE-TIMOLOL 0.2-0.5 % OP SOLN
OPHTHALMIC | Status: DC | PRN
  Administered 2020-10-05: 1 [drp] via OPHTHALMIC

## 2020-10-05 MED ORDER — NA CHONDROIT SULF-NA HYALURON 40-17 MG/ML IO SOLN
INTRAOCULAR | Status: DC | PRN
  Administered 2020-10-05: 1 mL via INTRAOCULAR

## 2020-10-05 SURGICAL SUPPLY — 19 items
CANNULA ANT/CHMB 27G (MISCELLANEOUS) ×2 IMPLANT
CANNULA ANT/CHMB 27GA (MISCELLANEOUS) ×4 IMPLANT
GLOVE SURG LX 8.0 MICRO (GLOVE) ×1
GLOVE SURG LX STRL 8.0 MICRO (GLOVE) ×1 IMPLANT
GLOVE SURG TRIUMPH 8.0 PF LTX (GLOVE) ×2 IMPLANT
GOWN STRL REUS W/ TWL LRG LVL3 (GOWN DISPOSABLE) ×2 IMPLANT
GOWN STRL REUS W/TWL LRG LVL3 (GOWN DISPOSABLE) ×4
LENS IOL TECNIS EYHANCE 13.5 (Intraocular Lens) ×1 IMPLANT
MARKER SKIN DUAL TIP RULER LAB (MISCELLANEOUS) ×2 IMPLANT
NDL FILTER BLUNT 18X1 1/2 (NEEDLE) ×1 IMPLANT
NEEDLE FILTER BLUNT 18X 1/2SAF (NEEDLE) ×1
NEEDLE FILTER BLUNT 18X1 1/2 (NEEDLE) ×1 IMPLANT
PACK EYE AFTER SURG (MISCELLANEOUS) ×2 IMPLANT
PACK OPTHALMIC (MISCELLANEOUS) ×2 IMPLANT
PACK PORFILIO (MISCELLANEOUS) ×2 IMPLANT
SYR 3ML LL SCALE MARK (SYRINGE) ×2 IMPLANT
SYR TB 1ML LUER SLIP (SYRINGE) ×2 IMPLANT
WATER STERILE IRR 250ML POUR (IV SOLUTION) ×2 IMPLANT
WIPE NON LINTING 3.25X3.25 (MISCELLANEOUS) ×2 IMPLANT

## 2020-10-05 NOTE — Anesthesia Procedure Notes (Signed)
Procedure Name: MAC Date/Time: 10/05/2020 12:08 PM Performed by: Cameron Ali, CRNA Pre-anesthesia Checklist: Patient identified, Emergency Drugs available, Suction available, Timeout performed and Patient being monitored Patient Re-evaluated:Patient Re-evaluated prior to induction Oxygen Delivery Method: Nasal cannula Placement Confirmation: positive ETCO2

## 2020-10-05 NOTE — Transfer of Care (Signed)
Immediate Anesthesia Transfer of Care Note  Patient: Sarah Sweeney  Procedure(s) Performed: CATARACT EXTRACTION PHACO AND INTRAOCULAR LENS PLACEMENT (IOC) RIGHT 4.44 00:34.3  (Right Eye)  Patient Location: PACU  Anesthesia Type: MAC  Level of Consciousness: awake, alert  and patient cooperative  Airway and Oxygen Therapy: Patient Spontanous Breathing and Patient connected to supplemental oxygen  Post-op Assessment: Post-op Vital signs reviewed, Patient's Cardiovascular Status Stable, Respiratory Function Stable, Patent Airway and No signs of Nausea or vomiting  Post-op Vital Signs: Reviewed and stable  Complications: No complications documented.

## 2020-10-05 NOTE — Op Note (Signed)
PREOPERATIVE DIAGNOSIS:  Nuclear sclerotic cataract of the right eye.   POSTOPERATIVE DIAGNOSIS:  H25.11 Cataract   OPERATIVE PROCEDURE:@   SURGEON:  Galen Manila, MD.   ANESTHESIA:  Anesthesiologist: Fletcher Anon, MD CRNA: Maree Krabbe, CRNA  1.      Managed anesthesia care. 2.      0.12ml of Shugarcaine was instilled in the eye following the paracentesis.   COMPLICATIONS:  None.   TECHNIQUE:   Stop and chop   DESCRIPTION OF PROCEDURE:  The patient was examined and consented in the preoperative holding area where the aforementioned topical anesthesia was applied to the right eye and then brought back to the Operating Room where the right eye was prepped and draped in the usual sterile ophthalmic fashion and a lid speculum was placed. A paracentesis was created with the side port blade and the anterior chamber was filled with viscoelastic. A near clear corneal incision was performed with the steel keratome. A continuous curvilinear capsulorrhexis was performed with a cystotome followed by the capsulorrhexis forceps. Hydrodissection and hydrodelineation were carried out with BSS on a blunt cannula. The lens was removed in a stop and chop  technique and the remaining cortical material was removed with the irrigation-aspiration handpiece. The capsular bag was inflated with viscoelastic and the Technis [id number]  lens was placed in the capsular bag without complication. The remaining viscoelastic was removed from the eye with the irrigation-aspiration handpiece. The wounds were hydrated. The anterior chamber was flushed with BSS and the eye was inflated to physiologic pressure. 0.84ml of Vigamox was placed in the anterior chamber. The wounds were found to be water tight. The eye was dressed with Combigan. The patient was given protective glasses to wear throughout the day and a shield with which to sleep tonight. The patient was also given drops with which to begin a drop regimen today and will  follow-up with me in one day. Implant Name Type Inv. Item Serial No. Manufacturer Lot No. LRB No. Used Action  LENS IOL TECNIS EYHANCE 13.5 - [id number] Intraocular Lens LENS IOL TECNIS EYHANCE 13.5 [id number] JOHNSON   Right 1 Implanted   Procedure(s) with comments: CATARACT EXTRACTION PHACO AND INTRAOCULAR LENS PLACEMENT (IOC) RIGHT 4.44 00:34.3  (Right) - Diabetic - diet controlled  Electronically signed: Galen Manila 10/05/2020 12:31 PM

## 2020-10-05 NOTE — H&P (Signed)
[facility]   Primary Care Physician:  Mick Sell, MD Ophthalmologist: Dr. Druscilla Brownie  Pre-Procedure History & Physical: HPI:  HALINA ASANO is a 80 y.o. female here for cataract surgery.   Past Medical History:  Diagnosis Date  . Allergy   . Apical variant hypertrophic cardiomyopathy (HCC)   . Diabetes mellitus without complication (HCC)   . Erythrocytosis 03/02/2015  . GERD (gastroesophageal reflux disease)   . Headache   . Hypertension   . Pulmonary embolism (HCC) 2013   Coumadin Therapy  . Sleep apnea    CPAP    Past Surgical History:  Procedure Laterality Date  . APPENDECTOMY    . CARDIOVERSION N/A 07/17/2019   Procedure: CARDIOVERSION;  Surgeon: Antonieta Iba, MD;  Location: [facility];  Service: Cardiovascular;  Laterality: N/A;  . CATARACT EXTRACTION W/PHACO Left 09/21/2020   Procedure: CATARACT EXTRACTION PHACO AND INTRAOCULAR LENS PLACEMENT (IOC) LEFT 4.94 00:36.2;  Surgeon: Galen Manila, MD;  Location: [facility];  Service: Ophthalmology;  Laterality: Left;  sleep apnea  . COLONOSCOPY WITH PROPOFOL N/A 03/04/2019   Procedure: COLONOSCOPY WITH PROPOFOL;  Surgeon: Toledo, Boykin Nearing, MD;  Location: [facility];  Service: Gastroenterology;  Laterality: N/A;  . TONSILLECTOMY      Prior to Admission medications   Medication Sig Start Date End Date Taking? Authorizing Provider  atorvastatin (LIPITOR) 20 MG tablet TAKE 1 TABLET BY MOUTH  DAILY 03/03/20  Yes End, Cristal Deer, MD  furosemide (LASIX) 20 MG tablet Take by mouth daily as needed. 04/15/20 04/15/21 Yes [provider]  latanoprost (XALATAN) 0.005 % ophthalmic solution Apply 1 drop to eye at bedtime.  02/02/14  Yes [provider]  losartan-hydrochlorothiazide (HYZAAR) 50-12.5 MG tablet Take 1 tablet by mouth daily.  04/18/19  Yes [provider]  omeprazole (PRILOSEC) 40 MG capsule Take 40 mg by mouth daily.  08/24/15  Yes [provider]  oxybutynin  (DITROPAN XL) 15 MG 24 hr tablet Take 15 mg by mouth at bedtime.    Yes [provider]  warfarin (COUMADIN) 2 MG tablet Take one tablet my mouth Sun. Tues. Wed. Thurs. And Sat.   Yes [provider]  warfarin (COUMADIN) 3 MG tablet Take one tablet by mouth Monday and Fridays   Yes [provider]    Allergies as of 08/03/2020  . (No Known Allergies)    Family History  Problem Relation Age of Onset  . Breast cancer Maternal Aunt        60's  . Cancer Maternal Uncle   . Stroke Maternal Grandfather     Social History   Socioeconomic History  . Marital status: Single    Spouse name: Not on file  . Number of children: Not on file  . Years of education: Not on file  . Highest education level: Not on file  Occupational History  . Not on file  Tobacco Use  . Smoking status: Never Smoker  . Smokeless tobacco: Never Used  Vaping Use  . Vaping Use: Never used  Substance and Sexual Activity  . Alcohol use: No  . Drug use: No  . Sexual activity: Not Currently  Other Topics Concern  . Not on file  Social History Narrative  . Not on file   Social Determinants of Health   Financial Resource Strain: Not on file  Food Insecurity: Not on file  Transportation Needs: Not on file  Physical Activity: Not on file  Stress: Not on file  Social  Connections: Not on file  Intimate Partner Violence: Not on file    Review of Systems: See HPI, otherwise negative ROS  Physical Exam: BP (!) 152/56   Pulse 69   Temp (!) 97 F (36.1 C) (Temporal)   Resp 18   Ht 5\' 4"  (1.626 m)   Wt 70.9 kg   SpO2 100%   BMI 26.81 kg/m  General:   Alert,  pleasant and cooperative in NAD Head:  Normocephalic and atraumatic. Respiratory:  Normal work of breathing.  Impression/Plan: LAKRISHA ISEMAN is here for cataract surgery.  Risks, benefits, limitations, and alternatives regarding cataract surgery have been reviewed with the patient.  Questions have been answered.  All  parties agreeable.   Courtney Paris, MD  10/05/2020, 11:57 AM

## 2020-10-05 NOTE — Anesthesia Postprocedure Evaluation (Signed)
Anesthesia Post Note  Patient: Sarah Sweeney  Procedure(s) Performed: CATARACT EXTRACTION PHACO AND INTRAOCULAR LENS PLACEMENT (IOC) RIGHT 4.44 00:34.3  (Right Eye)     Patient location during evaluation: PACU Anesthesia Type: MAC Level of consciousness: awake and alert Pain management: pain level controlled Vital Signs Assessment: post-procedure vital signs reviewed and stable Respiratory status: spontaneous breathing and nonlabored ventilation Cardiovascular status: blood pressure returned to baseline and bradycardic (Patient at baseline HR in mid 50's) Postop Assessment: no apparent nausea or vomiting Anesthetic complications: no   No complications documented.  Sarah Sweeney

## 2020-10-06 ENCOUNTER — Encounter: Payer: Self-pay | Admitting: Ophthalmology

## 2020-10-27 ENCOUNTER — Other Ambulatory Visit: Payer: Self-pay

## 2020-10-27 ENCOUNTER — Ambulatory Visit: Payer: Medicare Other | Admitting: Internal Medicine

## 2020-10-27 ENCOUNTER — Encounter: Payer: Self-pay | Admitting: Internal Medicine

## 2020-10-27 VITALS — BP 140/70 | HR 51 | Ht 65.0 in | Wt 156.1 lb

## 2020-10-27 DIAGNOSIS — I10 Essential (primary) hypertension: Secondary | ICD-10-CM

## 2020-10-27 DIAGNOSIS — I422 Other hypertrophic cardiomyopathy: Secondary | ICD-10-CM | POA: Diagnosis not present

## 2020-10-27 DIAGNOSIS — I482 Chronic atrial fibrillation, unspecified: Secondary | ICD-10-CM | POA: Diagnosis not present

## 2020-10-27 NOTE — Progress Notes (Signed)
Follow-up Outpatient Visit Date: 10/27/2020  Primary Care Provider: Mick Sell, MD [address] [place] [address]  Chief Complaint: Follow-up atrial fibrillation  HPI:  Ms. Douse is a 80 y.o. 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  I last saw her in 04/2020, at which time she was doing well other than longstanding occasional orthostatic lightheadedness.  Leg edema was well-controlled with compression stocking use.  We did not make any medication changes or pursue additional testing at that time.  Today, Ms. Pines reports feeling fairly well.  She has random pain between her shoulder blades that is not exertional.  It happens relatively infrequently.  She has stable exertional dyspnea going up stairs but otherwise has not had any shortness of breath.  Chronic mild lower extremity edema might be slightly worse than at our prior visit.  She has not had any orthopnea or PND.  She notes some occasional orthostatic lightheadedness, unchanged.  She remains compliant with her medications.  She has not had any bleeding, falls, or syncope.  --------------------------------------------------------------------------------------------------  Past Medical History:  Diagnosis Date  . Allergy   . Apical variant hypertrophic cardiomyopathy (HCC)   . Diabetes mellitus without complication (HCC)   . Erythrocytosis 03/02/2015  . GERD (gastroesophageal reflux disease)   . Headache   . Hypertension   . Pulmonary embolism (HCC) 2013   Coumadin Therapy  . Sleep apnea    CPAP   Past Surgical History:  Procedure Laterality Date  . APPENDECTOMY    .  CARDIOVERSION N/A 07/17/2019   Procedure: CARDIOVERSION;  Surgeon: Antonieta Iba, MD;  Location: [facility];  Service: Cardiovascular;  Laterality: N/A;  . CATARACT EXTRACTION W/PHACO Left 09/21/2020   Procedure: CATARACT EXTRACTION PHACO AND INTRAOCULAR LENS PLACEMENT (IOC) LEFT 4.94 00:36.2;  Surgeon: Galen Manila, MD;  Location: [facility];  Service: Ophthalmology;  Laterality: Left;  sleep apnea  . CATARACT EXTRACTION W/PHACO Right 10/05/2020   Procedure: CATARACT EXTRACTION PHACO AND INTRAOCULAR LENS PLACEMENT (IOC) RIGHT 4.44 00:34.3 ;  Surgeon: Galen Manila, MD;  [facility];  Service: Ophthalmology;  Laterality: Right;  Diabetic - diet controlled  . COLONOSCOPY WITH PROPOFOL N/A 03/04/2019   Procedure: COLONOSCOPY WITH PROPOFOL;  Surgeon: Toledo, Boykin Nearing, MD;  Location: [facility];  Service: Gastroenterology;  Laterality: N/A;  . TONSILLECTOMY      Current Meds  Medication Sig  . atorvastatin (LIPITOR) 20 MG tablet TAKE 1 TABLET BY MOUTH  DAILY  . furosemide (LASIX) 20 MG tablet Take by mouth daily as needed.  . latanoprost (XALATAN) 0.005 % ophthalmic solution Apply 1 drop to eye at bedtime.   Marland Kitchen losartan-hydrochlorothiazide (HYZAAR) 50-12.5 MG tablet Take 1 tablet by mouth daily.   Marland Kitchen omeprazole (PRILOSEC) 40 MG capsule Take 40 mg by mouth daily.   Marland Kitchen oxybutynin (DITROPAN XL) 15 MG 24 hr tablet Take 15 mg by mouth at bedtime.   Marland Kitchen warfarin (COUMADIN) 2 MG tablet Take one tablet my mouth Sun. Tues. Wed. Thurs. And Sat.  . warfarin (COUMADIN) 3 MG tablet Take one tablet by mouth Monday and Fridays    Allergies: Patient has no known allergies.  Social History   Tobacco Use  . Smoking status: Never Smoker  . Smokeless tobacco: Never  Used  Vaping Use  . Vaping Use: Never used  Substance Use Topics  . Alcohol use: No  . Drug use: No    Family History  Problem Relation Age of Onset  . Breast cancer Maternal Aunt        60's  . Cancer  Maternal Uncle   . Stroke Maternal Grandfather     Review of Systems: A 12-system review of systems was performed and was negative except as noted in the HPI.  --------------------------------------------------------------------------------------------------  Physical Exam: BP 140/70 (BP Location: Left Arm, Patient Position: Sitting, Cuff Size: Normal)   Pulse (!) 51   Ht 5\' 5"  (1.651 m)   Wt 156 lb 2 oz (70.8 kg)   SpO2 98%   BMI 25.98 kg/m   General:  NAD. Neck: No JVD or HJR. Lungs: Clear to auscultation bilaterally without wheezes or crackles. Heart: Bradycardic but regular with 1/6 systolic murmur.  No rubs or gallops. Abdomen: Soft, nontender, nondistended. Extremities: No lower extremity edema with compression stockings in place.  EKG: Coarse atrial fibrillation with slow ventricular response (ventricular rate 51 bpm; question junctional rhythm) and poor R wave progression in V1 and V2.  Heart rate has increased from 04/28/2020.  Otherwise, no significant change.  Lab Results  Component Value Date   WBC 5.5 07/14/2019   HGB 14.3 07/14/2019   HCT 45.5 07/14/2019   MCV 89.7 07/14/2019   PLT 184 07/14/2019    Lab Results  Component Value Date   NA 142 07/14/2019   K 4.2 07/14/2019   CL 104 07/14/2019   CO2 27 07/14/2019   BUN 20 07/14/2019   CREATININE 1.01 (H) 07/14/2019   GLUCOSE 85 07/14/2019   ALT 18 12/12/2017    --------------------------------------------------------------------------------------------------  ASSESSMENT AND PLAN: Chronic atrial fibrillation: Ms. Luckett remains in atrial fibrillation with slow ventricular response.  Other than stable and longstanding exertional dyspnea and orthostatic lightheadedness without syncope/falls, Ms. Linhart is asymptomatic.  She was previously seen by Dr. Andrey Campanile (EP) and underwent cardioversion with failed maintenance of sinus rhythm.  I discussed her case with Dr. Graciela Husbands at her last visit, and we agreed to defer  further attempts at rhythm control given minimal symptoms.  I spoke with Ms. Geil again today regarding follow-up with the EP including consideration of further rhythm control strategies versus pacing.  She wishes to defer this given stable and very mild symptoms.  I advised her to contact Andrey Campanile if she begins to feel worse in any way.  We will continue indefinite anticoagulation with warfarin (managed by Dr. [place]), which Ms. Brunke has been tolerating well and wishes to remain on rather than transition to a NOAC.  Apical hypertrophic cardiomyopathy: Ms. Hack appears euvolemic on exam.  Other than slightly worsening lower extremity edema controlled with compression stockings, she has not had any signs or symptoms to suggest progressive heart failure.  No indication for rate lowering agents given baseline bradycardia.  Follow-up: Return to clinic in 6 months.  Andrey Campanile, MD 10/27/2020 2:57 PM

## 2020-10-27 NOTE — Patient Instructions (Signed)

## 2020-10-29 ENCOUNTER — Encounter: Payer: Self-pay | Admitting: Internal Medicine

## 2020-11-03 ENCOUNTER — Ambulatory Visit: Payer: Medicare Other | Admitting: Pulmonary Disease

## 2020-11-03 ENCOUNTER — Encounter: Payer: Self-pay | Admitting: Pulmonary Disease

## 2020-11-03 ENCOUNTER — Other Ambulatory Visit: Payer: Self-pay

## 2020-11-03 VITALS — BP 120/80 | HR 53 | Temp 97.1°F | Ht 65.0 in | Wt 154.6 lb

## 2020-11-03 DIAGNOSIS — G4733 Obstructive sleep apnea (adult) (pediatric): Secondary | ICD-10-CM

## 2020-11-03 NOTE — Progress Notes (Signed)
[facility]  Chief Complaint  Patient presents with  . Follow-up    Constitutional:  BP 120/80 (BP Location: Right Arm, Patient Position: Sitting, Cuff Size: Normal)   Pulse (!) 53   Temp (!) 97.1 F (36.2 C) (Temporal)   Ht 5\' 5"  (1.651 m)   Wt 154 lb 9.6 oz (70.1 kg)   SpO2 98%   BMI 25.73 kg/m   Past Medical History:  DM, GERD, HA, HTN, PE, Persistent A fib, Hypertrophic CM, non sustained VT  Past Surgical History:  She  has a past surgical history that includes Appendectomy; Tonsillectomy; Colonoscopy with propofol (N/A, 03/04/2019); Cardioversion (N/A, 07/17/2019); Cataract extraction w/PHACO (Left, 09/21/2020); and Cataract extraction w/PHACO (Right, 10/05/2020).  Brief Summary:  Sarah Sweeney is a 80 y.o. female with obstructive sleep apnea.      Subjective:   She uses CPAP nightly.  Has nasal mask.  Tried mask liner, but wasn't comfortable.  She has to tighten her mask to get good fit.  She has been getting air leak from her mask.  She is planning a trip to [place] to visit with friends.  Physical Exam:   Appearance - well kempt   ENMT - no sinus tenderness, no oral exudate, no LAN, Mallampati 3 airway, no stridor  Respiratory - equal breath sounds bilaterally, no wheezing or rales  CV - s1s2 regular rate and rhythm, no murmurs  Ext - no clubbing, no edema  Skin - no rashes  Psych - normal mood and affect   Chest Imaging:   CT chest 10/18/15 >> LLL nodule stable since 2014, coronary calcification, cholelithiasis  Sleep Tests:   HST 11/04/19 >> AHI 35.7, SpO2 low 81%  Auto CPAP 10/03/20 to 11/01/20 >> used on 30 of 30 nights with average 6 hrs 50 min.  Average AHI 7.9 with median CPAP 12 and 95 th percentile CPAP 14 cm H2O  Cardiac Tests:   Echo 03/19/19 >> EF 50 to 55%, severe LVH, mild/mod MR  Social History:  She  reports that she has never smoked. She has never used smokeless tobacco. She reports that  she does not drink alcohol and does not use drugs.  Family History:  Her family history includes Breast cancer in her maternal aunt; Cancer in her maternal uncle; Stroke in her maternal grandfather.     Assessment/Plan:   Obstructive sleep apnea. - she is compliant with CPAP and reports benefit from therapy - she uses [employer] for her DME - continue with auto CPAP 5 to 15 cm H2O - she is having trouble with mask leak - will get her fitted with a chin strap, and she can try using band aid over the bridge of her nose to help with mask marks - if she still has trouble with air leak, then she might need mask refitting - advised her to use her CPAP when she travels  Chronic atrial fibrillation. - followed by Dr. 05/19/19 End with [facility]   Time Spent Involved in Patient Care on Day of Examination:  32 minutes  Follow up:  Patient Instructions  Will arrange for a chin strap to use with your CPAP mask  Can try using a band aid over your nose to help with CPAP mask marks  Follow up in 1 year   Medication List:   Allergies as of 11/03/2020   No Known Allergies     Medication List       Accurate  as of November 03, 2020 11:57 AM. If you have any questions, ask your nurse or doctor.        atorvastatin 20 MG tablet Commonly known as: LIPITOR TAKE 1 TABLET BY MOUTH  DAILY   furosemide 20 MG tablet Commonly known as: LASIX Take by mouth daily as needed.   latanoprost 0.005 % ophthalmic solution Commonly known as: XALATAN Apply 1 drop to eye at bedtime.   losartan-hydrochlorothiazide 50-12.5 MG tablet Commonly known as: HYZAAR Take 1 tablet by mouth daily.   olmesartan-hydrochlorothiazide 20-12.5 MG tablet Commonly known as: BENICAR HCT Take 1 tablet by mouth daily.   omeprazole 40 MG capsule Commonly known as: PRILOSEC Take 40 mg by mouth daily.   oxybutynin 15 MG 24 hr tablet Commonly known as: DITROPAN XL Take 15 mg by mouth at bedtime.   warfarin 2  MG tablet Commonly known as: COUMADIN Take one tablet my mouth Sun. Tues. Wed. Thurs. And Sat.   warfarin 3 MG tablet Commonly known as: COUMADIN Take one tablet by mouth Monday and Fridays       Signature:  Coralyn Helling, MD [facility] Pager - [phone] 11/03/2020, 11:57 AM

## 2020-11-03 NOTE — Patient Instructions (Signed)
Will arrange for a chin strap to use with your CPAP mask  Can try using a band aid over your nose to help with CPAP mask marks  Follow up in 1 year

## 2021-01-06 ENCOUNTER — Other Ambulatory Visit: Payer: Self-pay | Admitting: Family Medicine

## 2021-01-06 ENCOUNTER — Other Ambulatory Visit: Payer: Self-pay

## 2021-01-06 ENCOUNTER — Ambulatory Visit
Admission: RE | Admit: 2021-01-06 | Discharge: 2021-01-06 | Disposition: A | Discharge status: Home / Self Care | Payer: Medicare Other | Source: Ambulatory Visit | Attending: Family Medicine | Admitting: Family Medicine

## 2021-01-06 DIAGNOSIS — M25561 Pain in right knee: Secondary | ICD-10-CM | POA: Diagnosis not present

## 2021-01-13 ENCOUNTER — Other Ambulatory Visit: Payer: Self-pay | Admitting: Internal Medicine

## 2021-01-14 ENCOUNTER — Other Ambulatory Visit: Payer: Self-pay

## 2021-04-04 ENCOUNTER — Other Ambulatory Visit: Payer: Self-pay | Admitting: Infectious Diseases

## 2021-04-04 DIAGNOSIS — L03116 Cellulitis of left lower limb: Secondary | ICD-10-CM

## 2021-04-04 DIAGNOSIS — I872 Venous insufficiency (chronic) (peripheral): Secondary | ICD-10-CM

## 2021-04-07 ENCOUNTER — Ambulatory Visit
Admission: RE | Admit: 2021-04-07 | Discharge: 2021-04-07 | Disposition: A | Discharge status: Home / Self Care | Payer: Medicare Other | Source: Ambulatory Visit | Attending: Infectious Diseases | Admitting: Infectious Diseases

## 2021-04-07 ENCOUNTER — Encounter: Payer: Self-pay | Admitting: Internal Medicine

## 2021-04-07 ENCOUNTER — Other Ambulatory Visit: Payer: Self-pay

## 2021-04-07 DIAGNOSIS — I872 Venous insufficiency (chronic) (peripheral): Secondary | ICD-10-CM | POA: Diagnosis present

## 2021-04-07 DIAGNOSIS — L03116 Cellulitis of left lower limb: Secondary | ICD-10-CM | POA: Diagnosis present

## 2021-04-08 ENCOUNTER — Encounter (INDEPENDENT_AMBULATORY_CARE_PROVIDER_SITE_OTHER): Payer: Self-pay | Admitting: Nurse Practitioner

## 2021-04-08 ENCOUNTER — Other Ambulatory Visit: Payer: Self-pay | Admitting: Internal Medicine

## 2021-04-08 ENCOUNTER — Ambulatory Visit (INDEPENDENT_AMBULATORY_CARE_PROVIDER_SITE_OTHER): Payer: Medicare Other | Admitting: Nurse Practitioner

## 2021-04-08 VITALS — BP 168/64 | HR 56 | Resp 15 | Wt 158.4 lb

## 2021-04-08 DIAGNOSIS — I8312 Varicose veins of left lower extremity with inflammation: Secondary | ICD-10-CM | POA: Diagnosis not present

## 2021-04-08 DIAGNOSIS — I10 Essential (primary) hypertension: Secondary | ICD-10-CM | POA: Diagnosis not present

## 2021-04-08 NOTE — Progress Notes (Signed)
Subjective:    Patient ID: Sarah Sweeney, female    DOB: Jan 11, 1941, 80 y.o.   MRN: [id number] Chief Complaint  Patient presents with  . New Patient (Initial Visit)    Ref fitzgerald lle cellulitis/venous stasis    Sarah Sweeney is a 80 year old female that presents today for concerns about discoloration and redness near her left ankle area.  The patient has been treated with 2 rounds of antibiotics however the redness has not improved.  The area itself is nonpainful however the patient does have a number of varicosities patient notes that these are tender and she has some pain in this area.  There are no open wounds or ulcerations.  The patient denies weeping.  She does note that she does have swelling at times.  The patient does note that she wears medical grade compression stockings and has worn them for years.  She also wears her compression stockings most days of the week.  Review of Systems  Cardiovascular:  Positive for leg swelling.  Skin:  Positive for color change.  All other systems reviewed and are negative.     Objective:   Physical Exam Vitals reviewed.  HENT:     Head: Normocephalic.  Cardiovascular:     Rate and Rhythm: Normal rate.     Pulses:          Posterior tibial pulses are 1+ on the left side.  Pulmonary:     Effort: Pulmonary effort is normal.  Musculoskeletal:     Left lower leg: Edema present.  Skin:    General: Skin is warm and dry.     Findings: Erythema present.  Neurological:     Mental Status: She is alert and oriented to person, place, and time.  Psychiatric:        Mood and Affect: Mood normal.        Behavior: Behavior normal.        Thought Content: Thought content normal.        Judgment: Judgment normal.    BP (!) 168/64 (BP Location: Right Arm)   Pulse (!) 56   Resp 15   Wt 158 lb 6.4 oz (71.8 kg)   BMI 26.36 kg/m   Past Medical History:  Diagnosis Date  . Allergy   . Apical variant hypertrophic cardiomyopathy (HCC)   .  Diabetes mellitus without complication (HCC)   . Erythrocytosis 03/02/2015  . GERD (gastroesophageal reflux disease)   . Headache   . Hypertension   . Pulmonary embolism (HCC) 2013   Coumadin Therapy  . Sleep apnea    CPAP    Social History   Socioeconomic History  . Marital status: Single    Spouse name: Not on file  . Number of children: Not on file  . Years of education: Not on file  . Highest education level: Not on file  Occupational History  . Not on file  Tobacco Use  . Smoking status: Never  . Smokeless tobacco: Never  Vaping Use  . Vaping Use: Never used  Substance and Sexual Activity  . Alcohol use: No  . Drug use: No  . Sexual activity: Not Currently  Other Topics Concern  . Not on file  Social History Narrative  . Not on file   Social Determinants of Health   Financial Resource Strain: Not on file  Food Insecurity: Not on file  Transportation Needs: Not on file  Physical Activity: Not on file  Stress: Not on file  Social Connections: Not on file  Intimate Partner Violence: Not on file    Past Surgical History:  Procedure Laterality Date  . APPENDECTOMY    . CARDIOVERSION N/A 07/17/2019   Procedure: CARDIOVERSION;  Surgeon: Antonieta Iba, MD;  Location: [facility];  Service: Cardiovascular;  Laterality: N/A;  . CATARACT EXTRACTION W/PHACO Left 09/21/2020   Procedure: CATARACT EXTRACTION PHACO AND INTRAOCULAR LENS PLACEMENT (IOC) LEFT 4.94 00:36.2;  Surgeon: Galen Manila, MD;  Location: [facility];  Service: Ophthalmology;  Laterality: Left;  sleep apnea  . CATARACT EXTRACTION W/PHACO Right 10/05/2020   Procedure: CATARACT EXTRACTION PHACO AND INTRAOCULAR LENS PLACEMENT (IOC) RIGHT 4.44 00:34.3 ;  Surgeon: Galen Manila, MD;  Location: [facility];  Service: Ophthalmology;  Laterality: Right;  Diabetic - diet controlled  . COLONOSCOPY WITH PROPOFOL N/A 03/04/2019   Procedure: COLONOSCOPY WITH PROPOFOL;  Surgeon: Toledo, Boykin Nearing, MD;  Location: [facility];  Service: Gastroenterology;  Laterality: N/A;  . TONSILLECTOMY      Family History  Problem Relation Age of Onset  . Breast cancer Maternal Aunt        60's  . Cancer Maternal Uncle   . Stroke Maternal Grandfather     No Known Allergies  CBC Latest Ref Rng & Units 07/14/2019 06/05/2019 12/12/2017  WBC 4.0 - 10.5 K/uL 5.5 4.8 5.8  Hemoglobin 12.0 - 15.0 g/dL 03.5 46.5 68.1  Hematocrit 36.0 - 46.0 % 45.5 47.5(H) 43.3  Platelets 150 - 400 K/uL 184 188 231      CMP     Component Value Date/Time   NA 142 07/14/2019 1320   NA 142 06/05/2019 1118   NA 144 05/06/2012 0337   K 4.2 07/14/2019 1320   K 4.3 05/06/2012 0337   CL 104 07/14/2019 1320   CL 111 (H) 05/06/2012 0337   CO2 27 07/14/2019 1320   CO2 25 05/06/2012 0337   GLUCOSE 85 07/14/2019 1320   GLUCOSE 122 (H) 05/06/2012 0337   BUN 20 07/14/2019 1320   BUN 18 06/05/2019 1118   BUN 18 05/06/2012 0337   CREATININE 1.01 (H) 07/14/2019 1320   CREATININE 0.94 11/10/2014 1348   CALCIUM 9.3 07/14/2019 1320   CALCIUM 8.3 (L) 05/06/2012 0337   PROT 6.8 12/12/2017 1340   PROT 6.6 11/10/2014 1348   ALBUMIN 3.7 12/12/2017 1340   ALBUMIN 3.7 11/10/2014 1348   AST 23 12/12/2017 1340   AST 17 11/10/2014 1348   ALT 18 12/12/2017 1340   ALT 16 11/10/2014 1348   ALKPHOS 67 12/12/2017 1340   ALKPHOS 61 11/10/2014 1348   BILITOT 0.7 12/12/2017 1340   BILITOT 0.5 11/10/2014 1348   GFRNONAA 54 (L) 07/14/2019 1320   GFRNONAA >60 11/10/2014 1348   GFRAA >60 07/14/2019 1320   GFRAA >60 11/10/2014 1348     No results found.     Assessment & Plan:   1. Varicose veins of left lower extremity with inflammation The redness that the patient has is not related to cellulitis but stasis dermatitis caused by likely venous reflux.  We will have the patient return at her convenience for noninvasive studies to evaluate for venous reflux.  In the interim the patient will continue to utilize her medical  grade compression stockings.  She is advised that she should wear them daily and place them first thing in the morning and take them off for bed.  The patient should not sleep in the stockings.  Elevation is also advised.  Activity is  also encouraged to help with edema.  Following reflux studies will determine if possible intervention is indicated for her varicosities.  2. Essential hypertension Continue antihypertensive medications as already ordered, these medications have been reviewed and there are no changes at this time.    Current Outpatient Medications on File Prior to Visit  Medication Sig Dispense Refill  . atorvastatin (LIPITOR) 20 MG tablet TAKE 1 TABLET BY MOUTH  DAILY 90 tablet 0  . cephALEXin (KEFLEX) 500 MG capsule Take 500 mg by mouth 4 (four) times daily.    . furosemide (LASIX) 20 MG tablet Take by mouth daily as needed.    . latanoprost (XALATAN) 0.005 % ophthalmic solution Apply 1 drop to eye at bedtime.     Marland Kitchen olmesartan-hydrochlorothiazide (BENICAR HCT) 20-12.5 MG tablet Take 1 tablet by mouth daily.    Marland Kitchen omeprazole (PRILOSEC) 40 MG capsule Take 40 mg by mouth daily.     Marland Kitchen oxybutynin (DITROPAN XL) 15 MG 24 hr tablet Take 15 mg by mouth at bedtime.     Marland Kitchen warfarin (COUMADIN) 2 MG tablet Take one tablet my mouth Sun. Tues. Wed. Thurs. And Sat.    . warfarin (COUMADIN) 3 MG tablet Take one tablet by mouth Monday and Fridays    . losartan-hydrochlorothiazide (HYZAAR) 50-12.5 MG tablet Take 1 tablet by mouth daily.  (Patient not taking: Reported on 04/08/2021)     No current facility-administered medications on file prior to visit.    There are no Patient Instructions on file for this visit. No follow-ups on file.   Georgiana Spinner, NP

## 2021-04-09 ENCOUNTER — Encounter (INDEPENDENT_AMBULATORY_CARE_PROVIDER_SITE_OTHER): Payer: Self-pay | Admitting: Nurse Practitioner

## 2021-04-19 ENCOUNTER — Other Ambulatory Visit (INDEPENDENT_AMBULATORY_CARE_PROVIDER_SITE_OTHER): Payer: Self-pay | Admitting: Nurse Practitioner

## 2021-04-19 DIAGNOSIS — I83812 Varicose veins of left lower extremities with pain: Secondary | ICD-10-CM

## 2021-04-22 ENCOUNTER — Ambulatory Visit (INDEPENDENT_AMBULATORY_CARE_PROVIDER_SITE_OTHER): Payer: Medicare Other

## 2021-04-22 ENCOUNTER — Encounter (INDEPENDENT_AMBULATORY_CARE_PROVIDER_SITE_OTHER): Payer: Self-pay | Admitting: Nurse Practitioner

## 2021-04-22 ENCOUNTER — Ambulatory Visit (INDEPENDENT_AMBULATORY_CARE_PROVIDER_SITE_OTHER): Payer: Medicare Other | Admitting: Nurse Practitioner

## 2021-04-22 ENCOUNTER — Other Ambulatory Visit: Payer: Self-pay

## 2021-04-22 VITALS — BP 122/74 | HR 64 | Resp 16 | Wt 155.4 lb

## 2021-04-22 DIAGNOSIS — I10 Essential (primary) hypertension: Secondary | ICD-10-CM | POA: Diagnosis not present

## 2021-04-22 DIAGNOSIS — I83812 Varicose veins of left lower extremities with pain: Secondary | ICD-10-CM | POA: Diagnosis not present

## 2021-04-22 NOTE — Progress Notes (Signed)
Subjective:    Patient ID: Sarah Sweeney, female    DOB: 11-06-1940, 80 y.o.   MRN: [id number] Chief Complaint  Patient presents with   Follow-up    Ultrasound follow up    Sarah Sweeney is a 80 year old female that returns for followup evaluation of her varicosities.  The patient continues to have pain in the lower extremities with dependency. The pain is lessened with elevation. Graduated compression stockings, Class I (20-30 mmHg), have been worn but the stockings do not eliminate the leg pain. Over-the-counter analgesics do not improve the symptoms. The degree of discomfort continues to interfere with daily activities. The patient notes the pain in the legs is causing problems with daily exercise, at the workplace and even with household activities and maintenance such as standing in the kitchen preparing meals and doing dishes.   Venous ultrasound shows normal deep venous system, there is evidence of deep venous insufficiency bilaterally.  The patient also has superficial venous reflux in the great saphenous vein at the saphenofemoral junction extending to the proximal calf bilaterally.   Review of Systems  Cardiovascular:  Positive for leg swelling.  Skin:  Positive for color change.  All other systems reviewed and are negative.     Objective:   Physical Exam Vitals reviewed.  HENT:     Head: Normocephalic.  Cardiovascular:     Rate and Rhythm: Normal rate.     Pulses: Normal pulses.  Pulmonary:     Effort: Pulmonary effort is normal.  Skin:    General: Skin is warm and dry.     Comments: Large palpable varicosities bilaterally with stasis dermatitis noted bilaterally  Neurological:     Mental Status: She is alert and oriented to person, place, and time.  Psychiatric:        Mood and Affect: Mood normal.        Behavior: Behavior normal.        Thought Content: Thought content normal.        Judgment: Judgment normal.    BP 122/74 (BP Location: Left Arm)   Pulse  64   Resp 16   Wt 155 lb 6.4 oz (70.5 kg)   BMI 25.86 kg/m   Past Medical History:  Diagnosis Date   Allergy    Apical variant hypertrophic cardiomyopathy (HCC)    Diabetes mellitus without complication (HCC)    Erythrocytosis 03/02/2015   GERD (gastroesophageal reflux disease)    Headache    Hypertension    Pulmonary embolism (HCC) 2013   Coumadin Therapy   Sleep apnea    CPAP    Social History   Socioeconomic History   Marital status: Single    Spouse name: Not on file   Number of children: Not on file   Years of education: Not on file   Highest education level: Not on file  Occupational History   Not on file  Tobacco Use   Smoking status: Never   Smokeless tobacco: Never  Vaping Use   Vaping Use: Never used  Substance and Sexual Activity   Alcohol use: No   Drug use: No   Sexual activity: Not Currently  Other Topics Concern   Not on file  Social History Narrative   Not on file   Social Determinants of Health   Financial Resource Strain: Not on file  Food Insecurity: Not on file  Transportation Needs: Not on file  Physical Activity: Not on file  Stress: Not on file  Social Connections:  Not on file  Intimate Partner Violence: Not on file    Past Surgical History:  Procedure Laterality Date   APPENDECTOMY     CARDIOVERSION N/A 07/17/2019   Procedure: CARDIOVERSION;  Surgeon: Antonieta Iba, MD;  Location: [facility];  Service: Cardiovascular;  Laterality: N/A;   CATARACT EXTRACTION W/PHACO Left 09/21/2020   Procedure: CATARACT EXTRACTION PHACO AND INTRAOCULAR LENS PLACEMENT (IOC) LEFT 4.94 00:36.2;  Surgeon: Galen Manila, MD;  Location: [facility];  Service: Ophthalmology;  Laterality: Left;  sleep apnea   CATARACT EXTRACTION W/PHACO Right 10/05/2020   Procedure: CATARACT EXTRACTION PHACO AND INTRAOCULAR LENS PLACEMENT (IOC) RIGHT 4.44 00:34.3 ;  Surgeon: Galen Manila, MD;  Location: [facility];  Service: Ophthalmology;   Laterality: Right;  Diabetic - diet controlled   COLONOSCOPY WITH PROPOFOL N/A 03/04/2019   Procedure: COLONOSCOPY WITH PROPOFOL;  Surgeon: Toledo, Boykin Nearing, MD;  Location: [facility];  Service: Gastroenterology;  Laterality: N/A;   TONSILLECTOMY      Family History  Problem Relation Age of Onset   Breast cancer Maternal Aunt        16's   Cancer Maternal Uncle    Stroke Maternal Grandfather     No Known Allergies  CBC Latest Ref Rng & Units 07/14/2019 06/05/2019 12/12/2017  WBC 4.0 - 10.5 K/uL 5.5 4.8 5.8  Hemoglobin 12.0 - 15.0 g/dL 54.5 62.5 63.8  Hematocrit 36.0 - 46.0 % 45.5 47.5(H) 43.3  Platelets 150 - 400 K/uL 184 188 231      CMP     Component Value Date/Time   NA 142 07/14/2019 1320   NA 142 06/05/2019 1118   NA 144 05/06/2012 0337   K 4.2 07/14/2019 1320   K 4.3 05/06/2012 0337   CL 104 07/14/2019 1320   CL 111 (H) 05/06/2012 0337   CO2 27 07/14/2019 1320   CO2 25 05/06/2012 0337   GLUCOSE 85 07/14/2019 1320   GLUCOSE 122 (H) 05/06/2012 0337   BUN 20 07/14/2019 1320   BUN 18 06/05/2019 1118   BUN 18 05/06/2012 0337   CREATININE 1.01 (H) 07/14/2019 1320   CREATININE 0.94 11/10/2014 1348   CALCIUM 9.3 07/14/2019 1320   CALCIUM 8.3 (L) 05/06/2012 0337   PROT 6.8 12/12/2017 1340   PROT 6.6 11/10/2014 1348   ALBUMIN 3.7 12/12/2017 1340   ALBUMIN 3.7 11/10/2014 1348   AST 23 12/12/2017 1340   AST 17 11/10/2014 1348   ALT 18 12/12/2017 1340   ALT 16 11/10/2014 1348   ALKPHOS 67 12/12/2017 1340   ALKPHOS 61 11/10/2014 1348   BILITOT 0.7 12/12/2017 1340   BILITOT 0.5 11/10/2014 1348   GFRNONAA 54 (L) 07/14/2019 1320   GFRNONAA >60 11/10/2014 1348   GFRAA >60 07/14/2019 1320   GFRAA >60 11/10/2014 1348     No results found.     Assessment & Plan:   1. Varicose veins of left lower extremity with pain Recommend  I have reviewed my previous  discussion with the patient regarding  varicose veins and why they cause symptoms. Patient will continue   wearing graduated compression stockings class 1 on a daily basis, beginning first thing in the morning and removing them in the evening.    In addition, behavioral modification including elevation during the day was again discussed and this will continue.  The patient has utilized over the counter pain medications and has been exercising.  However, at this time conservative therapy has not alleviated the patient's symptoms of leg  pain and swelling  Recommend: laser ablation of the right and  left great saphenous veins to eliminate the symptoms of pain and swelling of the lower extremities caused by the severe superficial venous reflux disease.    2. Essential hypertension Continue antihypertensive medications as already ordered, these medications have been reviewed and there are no changes at this time.    Current Outpatient Medications on File Prior to Visit  Medication Sig Dispense Refill   atorvastatin (LIPITOR) 20 MG tablet TAKE 1 TABLET BY MOUTH  DAILY 90 tablet 0   latanoprost (XALATAN) 0.005 % ophthalmic solution Apply 1 drop to eye at bedtime.      olmesartan-hydrochlorothiazide (BENICAR HCT) 20-12.5 MG tablet Take 1 tablet by mouth daily.     omeprazole (PRILOSEC) 40 MG capsule Take 40 mg by mouth daily.      oxybutynin (DITROPAN XL) 15 MG 24 hr tablet Take 15 mg by mouth at bedtime.      warfarin (COUMADIN) 2 MG tablet Take one tablet my mouth Sun. Tues. Wed. Thurs. And Sat.     warfarin (COUMADIN) 3 MG tablet Take one tablet by mouth Monday and Fridays     cephALEXin (KEFLEX) 500 MG capsule Take 500 mg by mouth 4 (four) times daily.     furosemide (LASIX) 20 MG tablet Take by mouth daily as needed.     losartan-hydrochlorothiazide (HYZAAR) 50-12.5 MG tablet Take 1 tablet by mouth daily.  (Patient not taking: Reported on 04/08/2021)     No current facility-administered medications on file prior to visit.    There are no Patient Instructions on file for this visit. No  follow-ups on file.   Georgiana Spinner, NP

## 2021-05-04 ENCOUNTER — Ambulatory Visit: Payer: Medicare Other | Admitting: Internal Medicine

## 2021-05-14 NOTE — Progress Notes (Signed)
Follow-up Outpatient Visit Date: 05/18/2021  Primary Care Provider: Mick Sell, MD [address] [place] [address]  Chief Complaint: Fatigue  HPI:  Sarah Sweeney is a 80 y.o. 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  I last saw her 6 months ago, at which time she was feeling relatively well.  Today, Sarah Sweeney reports that she has been feeling relatively well although she notes fatigue and lack of energy over the last few weeks.  She recently returned from [place] and noticed that it was harder for her to do activities while visiting there.  She thought maybe it was related to problems with her CPAP, though she has been using this again since returning to [place] and has not noticed much improvement.  She notes occasional orthostatic lightheadedness but has not passed out or fallen.  She denies chest pain, shortness of breath, and palpitations.  She noticed some left leg edema after our last visit that was attributed to a Baker's cyst.  She also recently saw Dr. Wyn Quaker for evaluation of varicose veins.  She has been using compression stockings, though she notes it is difficult to get these on.  --------------------------------------------------------------------------------------------------  Past Medical History:  Diagnosis Date   Allergy    Apical variant hypertrophic cardiomyopathy (HCC)    Diabetes mellitus without complication (HCC)    Erythrocytosis 03/02/2015   GERD (gastroesophageal reflux disease)    Headache    Hypertension    Pulmonary embolism (HCC) 2013   Coumadin Therapy   Sleep apnea    CPAP   Past Surgical  History:  Procedure Laterality Date   APPENDECTOMY     CARDIOVERSION N/A 07/17/2019   Procedure: CARDIOVERSION;  Surgeon: Antonieta Iba, MD;  Location: [facility];  Service: Cardiovascular;  Laterality: N/A;   CATARACT EXTRACTION W/PHACO Left 09/21/2020   Procedure: CATARACT EXTRACTION PHACO AND INTRAOCULAR LENS PLACEMENT (IOC) LEFT 4.94 00:36.2;  Surgeon: Galen Manila, MD;  Location: [facility];  Service: Ophthalmology;  Laterality: Left;  sleep apnea   CATARACT EXTRACTION W/PHACO Right 10/05/2020   Procedure: CATARACT EXTRACTION PHACO AND INTRAOCULAR LENS PLACEMENT (IOC) RIGHT 4.44 00:34.3 ;  Surgeon: Galen Manila, MD;  Location: [facility];  Service: Ophthalmology;  Laterality: Right;  Diabetic - diet controlled   COLONOSCOPY WITH PROPOFOL N/A 03/04/2019   Procedure: COLONOSCOPY WITH PROPOFOL;  Surgeon: Toledo, Boykin Nearing, MD;  Location: [facility];  Service: Gastroenterology;  Laterality: N/A;   TONSILLECTOMY      Current Meds  Medication Sig   atorvastatin (LIPITOR) 20 MG tablet TAKE 1 TABLET BY MOUTH  DAILY   furosemide (LASIX) 20 MG tablet Take by mouth daily as needed.   latanoprost (XALATAN) 0.005 % ophthalmic solution Apply 1 drop to eye at bedtime.    olmesartan-hydrochlorothiazide (BENICAR HCT) 20-12.5 MG tablet Take 1 tablet by mouth daily.   omeprazole (PRILOSEC) 40 MG capsule Take 40 mg by mouth daily.    oxybutynin (DITROPAN XL) 15 MG 24 hr tablet Take 15 mg by mouth at bedtime.    warfarin (COUMADIN) 2 MG tablet Take one tablet my mouth MWF   warfarin (COUMADIN) 3 MG tablet Take one tablet by mouth all other days    Allergies: Patient has no known allergies.  Social History   Tobacco Use   Smoking status: Never   Smokeless tobacco: Never  Vaping Use   Vaping Use: Never used  Substance Use Topics   Alcohol use: No   Drug use: No    Family History  Problem Relation Age of Onset   Hypertension Mother    Breast cancer Maternal Aunt         54's   Cancer Maternal Uncle    Stroke Maternal Grandfather     Review of Systems: A 12-system review of systems was performed and was negative except as noted in the HPI.  --------------------------------------------------------------------------------------------------  Physical Exam: BP (!) 150/74 (BP Location: Left Arm, Patient Position: Sitting, Cuff Size: Normal)   Pulse (!) 38   Ht 5\' 4"  (1.626 m)   Wt 154 lb (69.9 kg)   SpO2 98%   BMI 26.43 kg/m  Ambulatory heart rate increased from 42 -> 61 bpm when walking around the office.  General:  NAD. Neck: No JVD or HJR. Lungs: Clear to auscultation bilaterally without wheezes or crackles. Heart: Bradycardic but regular with 1/6 systolic murmur. Abdomen: Soft, nontender, nondistended. Extremities: No lower extremity edema.  EKG: Atrial flutter with slow ventricular response (suspect underlying junctional rhythm with ventricular rate 38 bpm) with LVH, and poor R wave progression in V1 and V2.  Heart rate has decreased since 10/27/2020.  Otherwise, no significant interval change.  Lab Results  Component Value Date   WBC 5.5 07/14/2019   HGB 14.3 07/14/2019   HCT 45.5 07/14/2019   MCV 89.7 07/14/2019   PLT 184 07/14/2019    Lab Results  Component Value Date   NA 142 07/14/2019   K 4.2 07/14/2019   CL 104 07/14/2019   CO2 27 07/14/2019   BUN 20 07/14/2019   CREATININE 1.01 (H) 07/14/2019   GLUCOSE 85 07/14/2019   ALT 18 12/12/2017   Outside labs: CMP (04/13/2021): Sodium 140, potassium 4.3, chloride 103, CO2 35, BUN 22, creatinine 1.0, glucose 94, calcium 9.5, AST 22, ALT 28, alkaline phosphatase 69, total bilirubin 1.2, total protein 6.0, albumin 3.8.  Hemoglobin A1c 7.8.  INR (05/12/2021): 2.3  CBC (12/16/2019): WBC 5.1, Hgb 14.4, HCT 45.5, PLT 207.  Lipid panel (12/16/2019): Total cholesterol 119, triglycerides 98, HDL 46, LDL  53.  --------------------------------------------------------------------------------------------------  ASSESSMENT AND PLAN: Atrial fibrillation/flutter with slow ventricular response: EKG today is most consistent with atrial flutter with possible junctional rhythm, ventricular rates in the upper 30s at rest.  With ambulation, ventricular rate increased to the low 60s.  Given that Sarah Sweeney expresses fatigue and occasional orthostatic lightheadedness, I worry that she is having symptomatic bradycardia.  I will refer her to Dr. Andrey Campanile for consideration of permanent pacing.  We will defer any AV nodal blocking agents.  Continue long-term anticoagulation with warfarin.  I will recheck a TSH and a CBC today to exclude other potential causes for bradycardia and fatigue.  Apical hypertrophic cardiomyopathy: Sarah Sweeney appears euvolemic on examination today.  Fatigue is most likely related more to her bradycardia than her cardiomyopathy.  Defer medication changes today.  Hypertension: Blood pressure mildly elevated today but better at recent visit in Dr. Andrey Campanile office.  We will continue current regimen of olmesartan-HCTZ.  Leg swelling: Minimal swelling noted on exam today.  Recent venous duplex showed bilateral venous insufficiency.  Continue olmesartan-HCTZ as well as as needed furosemide.  Sarah Sweeney should continue using compression stockings and follow-up with vascular surgery as previously recommended.  Hyperlipidemia: Lipids very  well controlled on last check in 12/2019.  Ongoing management/follow-up per Dr. Sampson Goon.  Influenza immunization: Seasonal flu vaccine administered at the patient's request.  Follow-up: Return to clinic in 6 months.  Yvonne Kendall, MD 05/18/2021 3:09 PM

## 2021-05-18 ENCOUNTER — Other Ambulatory Visit: Payer: Self-pay

## 2021-05-18 ENCOUNTER — Encounter: Payer: Self-pay | Admitting: Internal Medicine

## 2021-05-18 ENCOUNTER — Ambulatory Visit: Payer: Medicare Other | Admitting: Internal Medicine

## 2021-05-18 VITALS — BP 150/74 | HR 38 | Ht 64.0 in | Wt 154.0 lb

## 2021-05-18 DIAGNOSIS — M7989 Other specified soft tissue disorders: Secondary | ICD-10-CM

## 2021-05-18 DIAGNOSIS — R5383 Other fatigue: Secondary | ICD-10-CM

## 2021-05-18 DIAGNOSIS — R001 Bradycardia, unspecified: Secondary | ICD-10-CM

## 2021-05-18 DIAGNOSIS — Z23 Encounter for immunization: Secondary | ICD-10-CM

## 2021-05-18 DIAGNOSIS — I422 Other hypertrophic cardiomyopathy: Secondary | ICD-10-CM | POA: Diagnosis not present

## 2021-05-18 DIAGNOSIS — E785 Hyperlipidemia, unspecified: Secondary | ICD-10-CM

## 2021-05-18 DIAGNOSIS — I4891 Unspecified atrial fibrillation: Secondary | ICD-10-CM

## 2021-05-18 DIAGNOSIS — I10 Essential (primary) hypertension: Secondary | ICD-10-CM

## 2021-05-18 NOTE — Patient Instructions (Signed)
Medication Instructions:   Your physician recommends that you continue on your current medications as directed. Please refer to the Current Medication list given to you today.  *If you need a refill on your cardiac medications before your next appointment, please call your pharmacy*   Lab Work:  Today: CBC, TSH  If you have labs (blood work) drawn today and your tests are completely normal, you will receive your results only by: MyChart Message (if you have MyChart) OR A paper copy in the mail If you have any lab test that is abnormal or we need to change your treatment, we will call you to review the results.   Testing/Procedures:  None ordered   Follow-Up: At [facility], you and your health needs are our priority.  As part of our continuing mission to provide you with exceptional heart care, we have created designated Provider Care Teams.  These Care Teams include your primary Cardiologist (physician) and Advanced Practice Providers (APPs -  Physician Assistants and Nurse Practitioners) who all work together to provide you with the care you need, when you need it.  We recommend signing up for the patient portal called "MyChart".  Sign up information is provided on this After Visit Summary.  MyChart is used to connect with patients for Virtual Visits (Telemedicine).  Patients are able to view lab/test results, encounter notes, upcoming appointments, etc.  Non-urgent messages can be sent to your provider as well.   To learn more about what you can do with MyChart, go to [email or web address].    Your next appointment:    1) 6 month(s)  2) You have been referred to Dr. Lalla Brothers (for atrial fibrillation with slow ventricular response)  The format for your next appointment:   In Person  Provider:   You may see Yvonne Kendall, MD or one of the following Advanced Practice Providers on your designated Care Team:   Nicolasa Ducking, NP Eula Listen, PA-C Marisue Ivan,  PA-C Cadence Mellette, [place]

## 2021-05-19 LAB — TSH: TSH: 3.38 u[IU]/mL (ref 0.450–4.500)

## 2021-05-19 LAB — CBC
Hematocrit: 43 % (ref 34.0–46.6)
Hemoglobin: 14.1 g/dL (ref 11.1–15.9)
MCH: 27.5 pg (ref 26.6–33.0)
MCHC: 32.8 g/dL (ref 31.5–35.7)
MCV: 84 fL (ref 79–97)
Platelets: 208 10*3/uL (ref 150–450)
RBC: 5.13 x10E6/uL (ref 3.77–5.28)
RDW: 13.3 % (ref 11.7–15.4)
WBC: 5.8 10*3/uL (ref 3.4–10.8)

## 2021-06-14 NOTE — Progress Notes (Signed)
Electrophysiology Office Note:    Date:  06/15/2021   ID:  Sarah Sweeney, DOB 04/04/1941, MRN [id number]  PCP:  Mick Sell, MD  [facility] Cardiologist:  Yvonne Kendall, MD  [facility] Electrophysiologist:  Lanier Prude, MD   Referring MD: Yvonne Kendall, MD   Chief Complaint: AF  History of Present Illness:    Sarah Sweeney is a 80 y.o. female who presents for an evaluation of AF at the request of Dr End. Their medical history includes apical HCM, PE on warfarin, HTN, DM, GERD, OSA, obesity. She last saw Dr End on 05/18/2021.  At that appointment she was in AFL w/ slow ventricular response/junctional rhythm. She was referred for consideration of permanent pacemaker.   Today she tells me she is quite active.  She will go on frequent walks around her neighborhood that last for at least 30 minutes.  No presyncope or syncope.  She is very hesitant to consider pacemaker implant.     Past Medical History:  Diagnosis Date   Allergy    Apical variant hypertrophic cardiomyopathy (HCC)    Diabetes mellitus without complication (HCC)    Erythrocytosis 03/02/2015   GERD (gastroesophageal reflux disease)    Headache    Hypertension    Pulmonary embolism (HCC) 2013   Coumadin Therapy   Sleep apnea    CPAP    Past Surgical History:  Procedure Laterality Date   APPENDECTOMY     CARDIOVERSION N/A 07/17/2019   Procedure: CARDIOVERSION;  Surgeon: Antonieta Iba, MD;  Location: [facility];  Service: Cardiovascular;  Laterality: N/A;   CATARACT EXTRACTION W/PHACO Left 09/21/2020   Procedure: CATARACT EXTRACTION PHACO AND INTRAOCULAR LENS PLACEMENT (IOC) LEFT 4.94 00:36.2;  Surgeon: Galen Manila, MD;  Location: [facility];  Service: Ophthalmology;  Laterality: Left;  sleep apnea   CATARACT EXTRACTION W/PHACO Right 10/05/2020   Procedure: CATARACT EXTRACTION PHACO AND INTRAOCULAR LENS PLACEMENT (IOC) RIGHT 4.44 00:34.3 ;  Surgeon: Galen Manila, MD;   Location: [facility];  Service: Ophthalmology;  Laterality: Right;  Diabetic - diet controlled   COLONOSCOPY WITH PROPOFOL N/A 03/04/2019   Procedure: COLONOSCOPY WITH PROPOFOL;  Surgeon: Toledo, Boykin Nearing, MD;  Location: [facility];  Service: Gastroenterology;  Laterality: N/A;   TONSILLECTOMY      Current Medications: Current Meds  Medication Sig   atorvastatin (LIPITOR) 20 MG tablet TAKE 1 TABLET BY MOUTH  DAILY   furosemide (LASIX) 20 MG tablet Take by mouth daily as needed.   latanoprost (XALATAN) 0.005 % ophthalmic solution Apply 1 drop to eye at bedtime.    olmesartan-hydrochlorothiazide (BENICAR HCT) 20-12.5 MG tablet Take 1 tablet by mouth daily.   omeprazole (PRILOSEC) 40 MG capsule Take 40 mg by mouth daily.    oxybutynin (DITROPAN XL) 15 MG 24 hr tablet Take 15 mg by mouth at bedtime.    warfarin (COUMADIN) 2 MG tablet Take one tablet my mouth MWF   warfarin (COUMADIN) 3 MG tablet Take one tablet by mouth all other days     Allergies:   Patient has no known allergies.   Social History   Socioeconomic History   Marital status: Single    Spouse name: Not on file   Number of children: Not on file   Years of education: Not on file   Highest education level: Not on file  Occupational History   Not on file  Tobacco Use   Smoking status: Never   Smokeless tobacco: Never  Vaping  Use   Vaping Use: Never used  Substance and Sexual Activity   Alcohol use: No   Drug use: No   Sexual activity: Not Currently  Other Topics Concern   Not on file  Social History Narrative   Not on file   Social Determinants of Health   Financial Resource Strain: Not on file  Food Insecurity: Not on file  Transportation Needs: Not on file  Physical Activity: Not on file  Stress: Not on file  Social Connections: Not on file     Family History: The patient's family history includes Breast cancer in her maternal aunt; Cancer in her maternal uncle; Hypertension in her mother;  Stroke in her maternal grandfather.  ROS:   Please see the history of present illness.    All other systems reviewed and are negative.  EKGs/Labs/Other Studies Reviewed:    The following studies were reviewed today:  March 19, 2019 echo Left ventricular function low normal, 50% Right ventricular function low normal Dilated left and right atria Mild to moderate MR   EKG:  The ekg ordered today demonstrates atrial flutter with a ventricular rate of 52 bpm   Recent Labs: 05/18/2021: Hemoglobin 14.1; Platelets 208; TSH 3.380  Recent Lipid Panel No results found for: CHOL, TRIG, HDL, CHOLHDL, VLDL, LDLCALC, LDLDIRECT  Physical Exam:    VS:  BP (!) 148/72 (BP Location: Left Arm, Patient Position: Sitting, Cuff Size: Normal)   Pulse (!) 52   Ht 5\' 4"  (1.626 m)   Wt 154 lb (69.9 kg)   SpO2 94%   BMI 26.43 kg/m     Wt Readings from Last 3 Encounters:  06/15/21 154 lb (69.9 kg)  05/18/21 154 lb (69.9 kg)  04/22/21 155 lb 6.4 oz (70.5 kg)     GEN:  Well nourished, well developed in no acute distress HEENT: Normal NECK: No JVD; No carotid bruits LYMPHATICS: No lymphadenopathy CARDIAC: Regular rhythm, bradycardic, no murmurs, rubs, gallops RESPIRATORY:  Clear to auscultation without rales, wheezing or rhonchi  ABDOMEN: Soft, non-tender, non-distended MUSCULOSKELETAL:  No edema; No deformity  SKIN: Warm and dry NEUROLOGIC:  Alert and oriented x 3 PSYCHIATRIC:  Normal affect       ASSESSMENT:    1. Atrial fibrillation with slow ventricular response (HCC)   2. Bradycardia   3. Apical variant hypertrophic cardiomyopathy (HCC)   4. Persistent atrial fibrillation (HCC)    PLAN:    In order of problems listed above:  #Atrial fibrillation flutter with slow ventricular response She clearly has slow ventricular response but it is unclear to me at this point whether or not the slow ventricular responses are causing her fatigue.  I like her to wear another monitor to be able  to track her average heart rates and to link any symptoms she experiences during the monitoring period to slow her ventricular rates.  We will do a 7-day ZIO monitor.  She will continue Coumadin for stroke prophylaxis.  #Hypertrophic cardiomyopathy With a change in symptoms and reported fatigue, would favor repeating an echocardiogram to make sure there is been no changes to the structure or function of the heart.  We will get this done prior to her next appointment.  I will plan to see her back in 4 to 6 weeks to review the echo and ZIO monitor results.   Medication Adjustments/Labs and Tests Ordered: Current medicines are reviewed at length with the patient today.  Concerns regarding medicines are outlined above.  No orders of the  defined types were placed in this encounter.  No orders of the defined types were placed in this encounter.    Signed, Rossie Muskrat. Lalla Brothers, MD, [facility], [facility] 06/15/2021 12:12 PM    Electrophysiology [facility]

## 2021-06-15 ENCOUNTER — Ambulatory Visit: Payer: Medicare Other | Admitting: Cardiology

## 2021-06-15 ENCOUNTER — Encounter: Payer: Self-pay | Admitting: Cardiology

## 2021-06-15 ENCOUNTER — Ambulatory Visit (INDEPENDENT_AMBULATORY_CARE_PROVIDER_SITE_OTHER): Payer: Medicare Other

## 2021-06-15 ENCOUNTER — Other Ambulatory Visit: Payer: Self-pay

## 2021-06-15 VITALS — BP 148/72 | HR 52 | Ht 64.0 in | Wt 154.0 lb

## 2021-06-15 DIAGNOSIS — I4819 Other persistent atrial fibrillation: Secondary | ICD-10-CM

## 2021-06-15 DIAGNOSIS — I422 Other hypertrophic cardiomyopathy: Secondary | ICD-10-CM

## 2021-06-15 DIAGNOSIS — R001 Bradycardia, unspecified: Secondary | ICD-10-CM

## 2021-06-15 DIAGNOSIS — I4891 Unspecified atrial fibrillation: Secondary | ICD-10-CM

## 2021-06-15 NOTE — Patient Instructions (Addendum)
Medication Instructions:  Your physician recommends that you continue on your current medications as directed. Please refer to the Current Medication list given to you today. *If you need a refill on your cardiac medications before your next appointment, please call your pharmacy*  Lab Work: None ordered. If you have labs (blood work) drawn today and your tests are completely normal, you will receive your results only by: MyChart Message (if you have MyChart) OR A paper copy in the mail If you have any lab test that is abnormal or we need to change your treatment, we will call you to review the results.  Testing/Procedures: Your physician has requested that you have an echocardiogram. Echocardiography is a painless test that uses sound waves to create images of your heart. It provides your doctor with information about the size and shape of your heart and how well your heart's chambers and valves are working. This procedure takes approximately one hour. There are no restrictions for this procedure.  Please schedule for ECHO  Your physician has recommended that you wear a holter monitor. Holter monitors are medical devices that record the heart's electrical activity. Doctors most often use these monitors to diagnose arrhythmias. Arrhythmias are problems with the speed or rhythm of the heartbeat. The monitor is a small, portable device. You can wear one while you do your normal daily activities. This is usually used to diagnose what is causing palpitations/syncope (passing out).  You will wear a 7 day heart monitor  Follow-Up: At [facility], you and your health needs are our priority.  As part of our continuing mission to provide you with exceptional heart care, we have created designated Provider Care Teams.  These Care Teams include your primary Cardiologist (physician) and Advanced Practice Providers (APPs -  Physician Assistants and Nurse Practitioners) who all work together to provide you  with the care you need, when you need it.  Your next appointment:   Your physician wants you to follow-up in: 4-6 weeks with Dr. Lalla Brothers   Your physician has recommended that you wear a Zio monitor.   This monitor is a medical device that records the heart's electrical activity. Doctors most often use these monitors to diagnose arrhythmias. Arrhythmias are problems with the speed or rhythm of the heartbeat. The monitor is a small device applied to your chest. You can wear one while you do your normal daily activities. While wearing this monitor if you have any symptoms to push the button and record what you felt. Once you have worn this monitor for the period of time provider prescribed (Usually 14 days), you will return the monitor device in the postage paid box. Once it is returned they will download the data collected and provide [place] with a report which the provider will then review and we will call you with those results. Important tips:  Avoid showering during the first 24 hours of wearing the monitor. Avoid excessive sweating to help maximize wear time. Do not submerge the device, no hot tubs, and no swimming pools. Keep any lotions or oils away from the patch. After 24 hours you may shower with the patch on. Take brief showers with your back facing the shower head.  Do not remove patch once it has been placed because that will interrupt data and decrease adhesive wear time. Push the button when you have any symptoms and write down what you were feeling. Once you have completed wearing your monitor, remove and place into box which has postage  paid and place in your outgoing mailbox.  If for some reason you have misplaced your box then call our office and we can provide another box and/or mail it off for you.

## 2021-06-22 DIAGNOSIS — I4891 Unspecified atrial fibrillation: Secondary | ICD-10-CM

## 2021-06-22 DIAGNOSIS — I422 Other hypertrophic cardiomyopathy: Secondary | ICD-10-CM

## 2021-06-22 DIAGNOSIS — I4819 Other persistent atrial fibrillation: Secondary | ICD-10-CM | POA: Diagnosis not present

## 2021-06-22 DIAGNOSIS — R001 Bradycardia, unspecified: Secondary | ICD-10-CM

## 2021-06-27 ENCOUNTER — Telehealth: Payer: Self-pay | Admitting: Student

## 2021-06-27 NOTE — Telephone Encounter (Signed)
   Received call from [facility] about abnormal results. Patient seen by Dr. Lalla Brothers on 06/15/2021 for for evaluation of slow atrial flutter and consideration for pacemaker. Patient was very hesitant to have a pacemaker placed so Dr. Lalla Brothers ordered 1 week XT (non-live) Zio monitor. Called and spoke with [facility] staff. Patient wore monitor from 06/15/2021 to 06/22/2021. On 06/20/2021 at 2:53AM, patient was noted to have slow atrial flutter with rates of 22-36 bpm and then at 3:38AM patient had two back to back pauses totally 10.7 seconds. Full report not loaded at this time. Called and spoke with patient. Patient doing relatively well. She is still having intermittent fatigue but this is stable. She notes occasional dizziness if she bends over too long but denies any syncope. No new recommendations at this time. Will wait for full monitor results and Dr. Lovena Neighbours final recommendations. Discussed concerning symptoms of when to go to the ED. Patient voiced understanding and thanked me for calling.  Sarah Parker, PA-C 06/27/2021 7:14 PM

## 2021-06-28 ENCOUNTER — Other Ambulatory Visit: Payer: Self-pay | Admitting: Internal Medicine

## 2021-07-21 ENCOUNTER — Ambulatory Visit (INDEPENDENT_AMBULATORY_CARE_PROVIDER_SITE_OTHER): Payer: Medicare Other

## 2021-07-21 ENCOUNTER — Other Ambulatory Visit: Payer: Self-pay

## 2021-07-21 DIAGNOSIS — I4891 Unspecified atrial fibrillation: Secondary | ICD-10-CM | POA: Diagnosis not present

## 2021-07-21 DIAGNOSIS — R001 Bradycardia, unspecified: Secondary | ICD-10-CM

## 2021-07-21 DIAGNOSIS — I422 Other hypertrophic cardiomyopathy: Secondary | ICD-10-CM

## 2021-07-21 DIAGNOSIS — I4819 Other persistent atrial fibrillation: Secondary | ICD-10-CM | POA: Diagnosis not present

## 2021-07-21 MED ORDER — PERFLUTREN LIPID MICROSPHERE
1.0000 mL | INTRAVENOUS | Status: AC | PRN
  Administered 2021-07-21: 4 mL via INTRAVENOUS

## 2021-07-22 LAB — ECHOCARDIOGRAM COMPLETE
AR max vel: 2.21 cm2
AV Area VTI: 2.16 cm2
AV Area mean vel: 2.2 cm2
AV Mean grad: 4 mmHg
AV Peak grad: 10 mmHg
Ao pk vel: 1.58 m/s
S' Lateral: 3.1 cm

## 2021-07-26 NOTE — Progress Notes (Signed)
Electrophysiology Office Follow up Visit Note:    Date:  07/27/2021   ID:  Sarah Sweeney, DOB 09-02-1940, MRN [id number]  PCP:  Mick Sell, MD  [facility] Cardiologist:  Yvonne Kendall, MD  [facility] Electrophysiologist:  Lanier Prude, MD    Interval History:    Sarah Sweeney is a 80 y.o. female who presents for a follow up visit. They were last seen in clinic June 15, 2021.  She was referred to me to discuss possible pacemaker given history of atrial flutter with slow ventricular response.  I ordered a 7-day ZIO monitor at her last appointment.  Also ordered an echocardiogram given history of hypertrophic cardiomyopathy.  The patient tells me that since I last saw her she continues to have fatigue with exertion.  She is also had some lightheadedness/dizziness episodes while exerting herself.  No syncope.     Past Medical History:  Diagnosis Date   Allergy    Apical variant hypertrophic cardiomyopathy (HCC)    Diabetes mellitus without complication (HCC)    Erythrocytosis 03/02/2015   GERD (gastroesophageal reflux disease)    Headache    Hypertension    Pulmonary embolism (HCC) 2013   Coumadin Therapy   Sleep apnea    CPAP    Past Surgical History:  Procedure Laterality Date   APPENDECTOMY     CARDIOVERSION N/A 07/17/2019   Procedure: CARDIOVERSION;  Surgeon: Antonieta Iba, MD;  Location: [facility];  Service: Cardiovascular;  Laterality: N/A;   CATARACT EXTRACTION W/PHACO Left 09/21/2020   Procedure: CATARACT EXTRACTION PHACO AND INTRAOCULAR LENS PLACEMENT (IOC) LEFT 4.94 00:36.2;  Surgeon: Galen Manila, MD;  Location: [facility];  Service: Ophthalmology;  Laterality: Left;  sleep apnea   CATARACT EXTRACTION W/PHACO Right 10/05/2020   Procedure: CATARACT EXTRACTION PHACO AND INTRAOCULAR LENS PLACEMENT (IOC) RIGHT 4.44 00:34.3 ;  Surgeon: Galen Manila, MD;  Location: [facility];  Service: Ophthalmology;  Laterality:  Right;  Diabetic - diet controlled   COLONOSCOPY WITH PROPOFOL N/A 03/04/2019   Procedure: COLONOSCOPY WITH PROPOFOL;  Surgeon: Toledo, Boykin Nearing, MD;  Location: [facility];  Service: Gastroenterology;  Laterality: N/A;   TONSILLECTOMY      Current Medications: Current Meds  Medication Sig   atorvastatin (LIPITOR) 20 MG tablet TAKE 1 TABLET BY MOUTH  DAILY   latanoprost (XALATAN) 0.005 % ophthalmic solution Apply 1 drop to eye at bedtime.    olmesartan-hydrochlorothiazide (BENICAR HCT) 20-12.5 MG tablet Take 1 tablet by mouth daily.   omeprazole (PRILOSEC) 40 MG capsule Take 40 mg by mouth daily.    oxybutynin (DITROPAN XL) 15 MG 24 hr tablet Take 15 mg by mouth at bedtime.    warfarin (COUMADIN) 2 MG tablet Take one tablet my mouth MWF   warfarin (COUMADIN) 3 MG tablet Take one tablet by mouth all other days     Allergies:   Patient has no known allergies.   Social History   Socioeconomic History   Marital status: Single    Spouse name: Not on file   Number of children: Not on file   Years of education: Not on file   Highest education level: Not on file  Occupational History   Not on file  Tobacco Use   Smoking status: Never   Smokeless tobacco: Never  Vaping Use   Vaping Use: Never used  Substance and Sexual Activity   Alcohol use: No   Drug use: No   Sexual activity: Not Currently  Other  Topics Concern   Not on file  Social History Narrative   Not on file   Social Determinants of Health   Financial Resource Strain: Not on file  Food Insecurity: Not on file  Transportation Needs: Not on file  Physical Activity: Not on file  Stress: Not on file  Social Connections: Not on file     Family History: The patient's family history includes Breast cancer in her maternal aunt; Cancer in her maternal uncle; Hypertension in her mother; Stroke in her maternal grandfather.  ROS:   Please see the history of present illness.    All other systems reviewed and are  negative.  EKGs/Labs/Other Studies Reviewed:    The following studies were reviewed today:  July 21, 2021 echo Left ventricular function normal, 60% Severe apical hypertrophy Right ventricular function normal Mild to moderate LA dilation Moderate MR  June 28, 2021 ZIO monitor personally reviewed HR 22 - 107bpm, average 44 bpm. 100% atrial flutter during the monitoring period. Rare ventricular ectopy. Several pauses, with longest lasting over 6 seconds. Some pauses/bradycardic episodes occurring during daytime hours.      EKG:  The ekg ordered today demonstrates atrial flutter with a ventricular rate of 39 bpm.  QRS is narrow at 75 bpm.  The QRSs are regular raising the question of whether or not this could be complete heart block.  Recent Labs: 05/18/2021: Hemoglobin 14.1; Platelets 208; TSH 3.380  Recent Lipid Panel No results found for: CHOL, TRIG, HDL, CHOLHDL, VLDL, LDLCALC, LDLDIRECT  Physical Exam:    VS:  BP 120/66    Pulse (!) 39    Ht 5\' 4"  (1.626 m)    Wt 156 lb 9.6 oz (71 kg)    SpO2 94%    BMI 26.88 kg/m     Wt Readings from Last 3 Encounters:  07/27/21 156 lb 9.6 oz (71 kg)  06/15/21 154 lb (69.9 kg)  05/18/21 154 lb (69.9 kg)     GEN:  Well nourished, well developed in no acute distress HEENT: Normal NECK: No JVD; No carotid bruits LYMPHATICS: No lymphadenopathy CARDIAC: Regular rhythm, bradycardic, no murmurs, rubs, gallops RESPIRATORY:  Clear to auscultation without rales, wheezing or rhonchi  ABDOMEN: Soft, non-tender, non-distended MUSCULOSKELETAL:  No edema; No deformity  SKIN: Warm and dry NEUROLOGIC:  Alert and oriented x 3 PSYCHIATRIC:  Normal affect        ASSESSMENT:    1. Permanent atrial fibrillation (HCC)   2. On Coumadin for atrial fibrillation (HCC)   3. Symptomatic bradycardia    PLAN:    In order of problems listed above:   #Symptomatic bradycardia #Atrial fibrillation/flutter, permanent  The patient has  symptomatic bradycardia in the setting of permanent atrial fibrillation/flutter.  Her ventricular rate is 39 bpm.  She is active but has fatigue with minimal exertion and is experienced lightheadedness/dizziness episodes.  Permanent pacemaker is indicated.  We will plan for a single-chamber pacemaker with a left bundle area lead from [employer].  Procedure reviewed with the patient including risk, recovery and she wishes to proceed.  She will hold her Coumadin for 4 days prior to the procedure.  Risks, benefits, alternatives to PPM implantation were discussed in detail with the patient today. The patient understands that the risks include but are not limited to bleeding, infection, pneumothorax, perforation, tamponade, vascular damage, renal failure, MI, stroke, death, and lead dislodgement and wishes to proceed.  We will therefore schedule device implantation at the next available time.  Total time spent with patient today 45 minutes. This includes reviewing records, evaluating the patient and coordinating care.   Medication Adjustments/Labs and Tests Ordered: Current medicines are reviewed at length with the patient today.  Concerns regarding medicines are outlined above.  No orders of the defined types were placed in this encounter.  No orders of the defined types were placed in this encounter.    Signed, Steffanie Dunn, MD, [facility], [facility] 07/27/2021 11:44 AM    Electrophysiology [facility]

## 2021-07-27 ENCOUNTER — Other Ambulatory Visit: Payer: Self-pay

## 2021-07-27 ENCOUNTER — Ambulatory Visit: Payer: Medicare Other | Admitting: Cardiology

## 2021-07-27 ENCOUNTER — Encounter: Payer: Self-pay | Admitting: Cardiology

## 2021-07-27 VITALS — BP 120/66 | HR 39 | Ht 64.0 in | Wt 156.6 lb

## 2021-07-27 DIAGNOSIS — I4821 Permanent atrial fibrillation: Secondary | ICD-10-CM

## 2021-07-27 DIAGNOSIS — R001 Bradycardia, unspecified: Secondary | ICD-10-CM

## 2021-07-27 DIAGNOSIS — Z7901 Long term (current) use of anticoagulants: Secondary | ICD-10-CM

## 2021-07-27 DIAGNOSIS — I4891 Unspecified atrial fibrillation: Secondary | ICD-10-CM | POA: Diagnosis not present

## 2021-07-27 NOTE — Patient Instructions (Addendum)
Medication Instructions:  Your physician recommends that you continue on your current medications as directed. Please refer to the Current Medication list given to you today. *If you need a refill on your cardiac medications before your next appointment, please call your pharmacy*  Lab Work: None ordered. If you have labs (blood work) drawn today and your tests are completely normal, you will receive your results only by: MyChart Message (if you have MyChart) OR A paper copy in the mail If you have any lab test that is abnormal or we need to change your treatment, we will call you to review the results.  Testing/Procedures: Your physician has recommended that you have a pacemaker inserted. A pacemaker is a small device that is placed under the skin of your chest or abdomen to help control abnormal heart rhythms. This device uses electrical pulses to prompt the heart to beat at a normal rate. Pacemakers are used to treat heart rhythms that are too slow. Wire (leads) are attached to the pacemaker that goes into the chambers of you heart. This is done in the hospital and usually requires and overnight stay. Please see the instruction sheet given to you today for more information.  Follow-Up:  Pacemaker implant - September 08, 2021   10-14 days with the device clinic  91 days with Dr. Lalla Brothers   Pacemaker Implantation, Adult Pacemaker implantation is a procedure to place a pacemaker inside the chest. A pacemaker is a small computer that sends electrical signals to the heart and helps the heart beat normally. A pacemaker also stores information about heart rhythms. You may need pacemaker implantation if you have: A slow heartbeat (bradycardia). Loss of consciousness that happens repeatedly (syncope) or repeated episodes of dizziness or light-headedness because of an irregular heart rate. Shortness of breath (dyspnea) due to heart problems. The pacemaker usually attaches to your heart through a  wire called a lead. One or two leads may be needed. There are different types of pacemakers: Transvenous pacemaker. This type is placed under the skin or muscle of your upper chest area. The lead goes through a vein in the chest area to reach the inside of the heart. Epicardial pacemaker. This type is placed under the skin or muscle of your chest or abdomen. The lead goes through your chest to the outside of the heart. Tell a health care provider about: Any allergies you have. All medicines you are taking, including vitamins, herbs, eye drops, creams, and over-the-counter medicines. Any problems you or family members have had with anesthetic medicines. Any blood or bone disorders you have. Any surgeries you have had. Any medical conditions you have. Whether you are pregnant or may be pregnant. What are the risks? Generally, this is a safe procedure. However, problems may occur, including: Infection. Bleeding. Failure of the pacemaker or the lead. Collapse of a lung or bleeding into a lung. Blood clot inside a blood vessel with a lead. Damage to the heart. Infection inside the heart (endocarditis). Allergic reactions to medicines. What happens before the procedure? Staying hydrated Follow instructions from your health care provider about hydration, which may include: Up to 2 hours before the procedure - you may continue to drink clear liquids, such as water, clear fruit juice, black coffee, and plain tea.  Eating and drinking restrictions Follow instructions from your health care provider about eating and drinking, which may include: 8 hours before the procedure - stop eating heavy meals or foods, such as meat, fried foods, or fatty foods.  6 hours before the procedure - stop eating light meals or foods, such as toast or cereal. 6 hours before the procedure - stop drinking milk or drinks that contain milk. 2 hours before the procedure - stop drinking clear liquids. Medicines Ask your  health care provider about: Changing or stopping your regular medicines. This is especially important if you are taking diabetes medicines or blood thinners. Taking medicines such as aspirin and ibuprofen. These medicines can thin your blood. Do not take these medicines unless your health care provider tells you to take them. Taking over-the-counter medicines, vitamins, herbs, and supplements. Tests You may have: A heart evaluation. This may include: An electrocardiogram (ECG). This involves placing patches on your skin to check your heart rhythm. A chest X-ray. An echocardiogram. This is a test that uses sound waves (ultrasound) to produce an image of the heart. A cardiac rhythm monitor. This is used to record your heart rhythm and any events for a longer period of time. Blood tests. Genetic testing. General instructions Do not use any products that contain nicotine or tobacco for at least 4 weeks before the procedure. These products include cigarettes, e-cigarettes, and chewing tobacco. If you need help quitting, ask your health care provider. Ask your health care provider: How your surgery site will be marked. What steps will be taken to help prevent infection. These steps may include: Removing hair at the surgery site. Washing skin with a germ-killing soap. Receiving antibiotic medicine. Plan to have someone take you home from the hospital or clinic. If you will be going home right after the procedure, plan to have someone with you for 24 hours. What happens during the procedure? An IV will be inserted into one of your veins. You will be given one or more of the following: A medicine to help you relax (sedative). A medicine to numb the area (local anesthetic). A medicine to make you fall asleep (general anesthetic). The next steps vary depending on the type of pacemaker you will be getting. If you are getting a transvenous pacemaker: An incision will be made in your upper chest. A  pocket will be made for the pacemaker. It may be placed under the skin or between layers of muscle. The lead will be inserted into a blood vessel that goes to the heart. While X-rays are taken by an imaging machine (fluoroscopy), the lead will be advanced through the vein to the inside of your heart. The other end of the lead will be tunneled under the skin and attached to the pacemaker. If you are getting an epicardial pacemaker: An incision will be made near your ribs or breastbone (sternum) for the lead. The lead will be attached to the outside of your heart. Another incision will be made in your chest or upper abdomen to create a pocket for the pacemaker. The free end of the lead will be tunneled under the skin and attached to the pacemaker. The transvenous or epicardial pacemaker will be tested. Imaging studies may be done to check the lead position. The incisions will be closed with stitches (sutures), adhesive strips, or skin glue. Bandages (dressings) will be placed over the incisions. The procedure may vary among health care providers and hospitals. What happens after the procedure? Your blood pressure, heart rate, breathing rate, and blood oxygen level will be monitored until you leave the hospital or clinic. You may be given antibiotics. You will be given pain medicine. An ECG and chest X-rays will be done. You may  need to wear a continuous type of ECG (Holter monitor) to check your heart rhythm. Your health care provider will program the pacemaker. If you were given a sedative during the procedure, it can affect you for several hours. Do not drive or operate machinery until your health care provider says that it is safe. You will be given a pacemaker identification card. This card lists the implant date, device model, and manufacturer of your pacemaker. Summary A pacemaker is a small computer that sends electrical signals to the heart and helps the heart beat normally. There are  different types of pacemakers. A pacemaker may be placed under the skin or muscle of your chest or abdomen. Follow instructions from your health care provider about eating and drinking and about taking medicines before the procedure. This information is not intended to replace advice given to you by your health care provider. Make sure you discuss any questions you have with your health care provider. Document Revised: 04/12/2021 Document Reviewed: 07/02/2019 Elsevier Patient Education  2022 [employer].

## 2021-09-07 NOTE — Pre-Procedure Instructions (Signed)
Attempted to call patient regarding procedure instructions.  No answer 

## 2021-09-08 ENCOUNTER — Encounter (HOSPITAL_COMMUNITY)
Admission: RE | Disposition: A | Discharge status: Home / Self Care | Payer: Self-pay | Source: Home / Self Care | Attending: Cardiology

## 2021-09-08 ENCOUNTER — Other Ambulatory Visit: Payer: Self-pay

## 2021-09-08 ENCOUNTER — Ambulatory Visit (HOSPITAL_COMMUNITY)
Admission: RE | Admit: 2021-09-08 | Discharge: 2021-09-09 | Disposition: A | Discharge status: Home / Self Care | Payer: Medicare Other | Attending: Cardiology | Admitting: Cardiology

## 2021-09-08 DIAGNOSIS — Z950 Presence of cardiac pacemaker: Secondary | ICD-10-CM | POA: Diagnosis present

## 2021-09-08 DIAGNOSIS — I4821 Permanent atrial fibrillation: Secondary | ICD-10-CM | POA: Diagnosis not present

## 2021-09-08 DIAGNOSIS — R001 Bradycardia, unspecified: Secondary | ICD-10-CM | POA: Insufficient documentation

## 2021-09-08 DIAGNOSIS — Z7901 Long term (current) use of anticoagulants: Secondary | ICD-10-CM | POA: Insufficient documentation

## 2021-09-08 DIAGNOSIS — I4892 Unspecified atrial flutter: Secondary | ICD-10-CM | POA: Insufficient documentation

## 2021-09-08 HISTORY — PX: PACEMAKER IMPLANT: EP1218

## 2021-09-08 LAB — PROTIME-INR
INR: 1.1 (ref 0.8–1.2)
Prothrombin Time: 14.6 seconds (ref 11.4–15.2)

## 2021-09-08 SURGERY — PACEMAKER IMPLANT

## 2021-09-08 MED ORDER — IRBESARTAN 150 MG PO TABS
150.0000 mg | ORAL_TABLET | Freq: Every day | ORAL | Status: DC
  Administered 2021-09-08 – 2021-09-09 (×2): 150 mg via ORAL
  Filled 2021-09-08 (×2): qty 1

## 2021-09-08 MED ORDER — ONDANSETRON HCL 4 MG/2ML IJ SOLN: 4.0000 mg | Freq: Four times a day (QID) | INTRAMUSCULAR | Status: DC | PRN

## 2021-09-08 MED ORDER — PANTOPRAZOLE SODIUM 40 MG PO TBEC
40.0000 mg | DELAYED_RELEASE_TABLET | Freq: Every day | ORAL | Status: DC
  Administered 2021-09-08 – 2021-09-09 (×2): 40 mg via ORAL
  Filled 2021-09-08 (×2): qty 1

## 2021-09-08 MED ORDER — OLMESARTAN MEDOXOMIL-HCTZ 20-12.5 MG PO TABS: 1.0000 | ORAL_TABLET | Freq: Every day | ORAL | Status: DC

## 2021-09-08 MED ORDER — SODIUM CHLORIDE 0.9 % IV SOLN
INTRAVENOUS | Status: AC
  Filled 2021-09-08: qty 2

## 2021-09-08 MED ORDER — CEFAZOLIN SODIUM-DEXTROSE 2-4 GM/100ML-% IV SOLN
2.0000 g | INTRAVENOUS | Status: AC
  Administered 2021-09-08: 2 g via INTRAVENOUS

## 2021-09-08 MED ORDER — CEFAZOLIN SODIUM-DEXTROSE 2-4 GM/100ML-% IV SOLN
INTRAVENOUS | Status: AC
  Filled 2021-09-08: qty 100

## 2021-09-08 MED ORDER — FENTANYL CITRATE (PF) 100 MCG/2ML IJ SOLN
INTRAMUSCULAR | Status: DC | PRN
  Administered 2021-09-08 (×2): 25 ug via INTRAVENOUS

## 2021-09-08 MED ORDER — LIDOCAINE HCL 1 % IJ SOLN
INTRAMUSCULAR | Status: AC
  Filled 2021-09-08: qty 20

## 2021-09-08 MED ORDER — CHLORHEXIDINE GLUCONATE 4 % EX LIQD: 4.0000 "application " | Freq: Once | CUTANEOUS | Status: DC

## 2021-09-08 MED ORDER — HYDROCHLOROTHIAZIDE 12.5 MG PO TABS
12.5000 mg | ORAL_TABLET | Freq: Every day | ORAL | Status: DC
  Administered 2021-09-08 – 2021-09-09 (×2): 12.5 mg via ORAL
  Filled 2021-09-08 (×2): qty 1

## 2021-09-08 MED ORDER — LIDOCAINE HCL (PF) 1 % IJ SOLN
INTRAMUSCULAR | Status: DC | PRN
  Administered 2021-09-08: 80 mL

## 2021-09-08 MED ORDER — SODIUM CHLORIDE 0.9 % IV SOLN: INTRAVENOUS | Status: DC

## 2021-09-08 MED ORDER — HEPARIN (PORCINE) IN NACL 1000-0.9 UT/500ML-% IV SOLN
INTRAVENOUS | Status: AC
  Filled 2021-09-08: qty 500

## 2021-09-08 MED ORDER — LIDOCAINE HCL (PF) 1 % IJ SOLN
INTRAMUSCULAR | Status: AC
  Filled 2021-09-08: qty 60

## 2021-09-08 MED ORDER — POVIDONE-IODINE 10 % EX SWAB: 2.0000 "application " | Freq: Once | CUTANEOUS | Status: DC

## 2021-09-08 MED ORDER — MIDAZOLAM HCL 5 MG/5ML IJ SOLN
INTRAMUSCULAR | Status: DC | PRN
  Administered 2021-09-08 (×2): 1 mg via INTRAVENOUS

## 2021-09-08 MED ORDER — MIDAZOLAM HCL 5 MG/5ML IJ SOLN
INTRAMUSCULAR | Status: AC
  Filled 2021-09-08: qty 5

## 2021-09-08 MED ORDER — ACETAMINOPHEN 325 MG PO TABS
325.0000 mg | ORAL_TABLET | ORAL | Status: DC | PRN
  Administered 2021-09-08: 650 mg via ORAL
  Filled 2021-09-08: qty 2

## 2021-09-08 MED ORDER — FENTANYL CITRATE (PF) 100 MCG/2ML IJ SOLN
INTRAMUSCULAR | Status: AC
  Filled 2021-09-08: qty 2

## 2021-09-08 MED ORDER — HEPARIN (PORCINE) IN NACL 1000-0.9 UT/500ML-% IV SOLN
INTRAVENOUS | Status: DC | PRN
  Administered 2021-09-08: 500 mL

## 2021-09-08 MED ORDER — SODIUM CHLORIDE 0.9 % IV SOLN
80.0000 mg | INTRAVENOUS | Status: AC
  Administered 2021-09-08: 80 mg

## 2021-09-08 SURGICAL SUPPLY — 12 items
CABLE SURGICAL S-101-97-12 (CABLE) ×2 IMPLANT
CATH RIGHTSITE C315HIS02 (CATHETERS) ×1 IMPLANT
IPG PACE AZUR XT SR MRI W1SR01 (Pacemaker) IMPLANT
LEAD SELECT SECURE 3830 383069 (Lead) IMPLANT
PACE AZURE XT SR MRI W1SR01 (Pacemaker) ×2 IMPLANT
PAD DEFIB RADIO PHYSIO CONN (PAD) ×2 IMPLANT
SELECT SECURE 3830 383069 (Lead) ×2 IMPLANT
SHEATH 9FR PRELUDE SNAP 13 (SHEATH) ×1 IMPLANT
SHEATH PROBE COVER 6X72 (BAG) ×1 IMPLANT
SLITTER 6232ADJ (MISCELLANEOUS) ×1 IMPLANT
TRAY PACEMAKER INSERTION (PACKS) ×2 IMPLANT
WIRE HI TORQ VERSACORE-J 145CM (WIRE) ×1 IMPLANT

## 2021-09-08 NOTE — H&P (Signed)
Electrophysiology Office Follow up Visit Note:     Date:  07/27/2021    ID:  Sarah Sweeney, DOB 1941-05-19, MRN [id number]   PCP:  Leonel Ramsay, MD            [facility] Cardiologist:  Nelva Bush, MD  [facility] Electrophysiologist:  Vickie Epley, MD      Interval History:     Sarah Sweeney is a 81 y.o. female who presents for a follow up visit. They were last seen in clinic June 15, 2021.  She was referred to me to discuss possible pacemaker given history of atrial flutter with slow ventricular response.  I ordered a 7-day ZIO monitor at her last appointment.  Also ordered an echocardiogram given history of hypertrophic cardiomyopathy.   The patient tells me that since I last saw her she continues to have fatigue with exertion.  She is also had some lightheadedness/dizziness episodes while exerting herself.  No syncope.     Objective          Past Medical History:  Diagnosis Date   Allergy     Apical variant hypertrophic cardiomyopathy ([place])     Diabetes mellitus without complication ([place])     Erythrocytosis 03/02/2015   GERD (gastroesophageal reflux disease)     Headache     Hypertension     Pulmonary embolism ([place]) 2013    Coumadin Therapy   Sleep apnea      CPAP           Past Surgical History:  Procedure Laterality Date   APPENDECTOMY       CARDIOVERSION N/A 07/17/2019    Procedure: CARDIOVERSION;  Surgeon: Minna Merritts, MD;  Location: [facility];  Service: Cardiovascular;  Laterality: N/A;   CATARACT EXTRACTION W/PHACO Left 09/21/2020    Procedure: CATARACT EXTRACTION PHACO AND INTRAOCULAR LENS PLACEMENT ([place]) LEFT 4.94 00:36.2;  Surgeon: Birder Robson, MD;  Location: [place];  Service: Ophthalmology;  Laterality: Left;  sleep apnea   CATARACT EXTRACTION W/PHACO Right 10/05/2020    Procedure: CATARACT EXTRACTION PHACO AND INTRAOCULAR LENS PLACEMENT (IOC) RIGHT 4.44 00:34.3 ;  Surgeon: Birder Robson, MD;  Location:  [place];  Service: Ophthalmology;  Laterality: Right;  Diabetic - diet controlled   COLONOSCOPY WITH PROPOFOL N/A 03/04/2019    Procedure: COLONOSCOPY WITH PROPOFOL;  Surgeon: Toledo, Benay Pike, MD;  Location: [facility];  Service: Gastroenterology;  Laterality: N/A;   TONSILLECTOMY          Current Medications: Active Medications      Current Meds  Medication Sig   atorvastatin (LIPITOR) 20 MG tablet TAKE 1 TABLET BY MOUTH  DAILY   latanoprost (XALATAN) 0.005 % ophthalmic solution Apply 1 drop to eye at bedtime.    olmesartan-hydrochlorothiazide (BENICAR HCT) 20-12.5 MG tablet Take 1 tablet by mouth daily.   omeprazole (PRILOSEC) 40 MG capsule Take 40 mg by mouth daily.    oxybutynin (DITROPAN XL) 15 MG 24 hr tablet Take 15 mg by mouth at bedtime.    warfarin (COUMADIN) 2 MG tablet Take one tablet my mouth MWF   warfarin (COUMADIN) 3 MG tablet Take one tablet by mouth all other days        Allergies:   Patient has no known allergies.    Social History         Socioeconomic History   Marital status: Single      Spouse name: Not on file   Number of children:  Not on file   Years of education: Not on file   Highest education level: Not on file  Occupational History   Not on file  Tobacco Use   Smoking status: Never   Smokeless tobacco: Never  Vaping Use   Vaping Use: Never used  Substance and Sexual Activity   Alcohol use: No   Drug use: No   Sexual activity: Not Currently  Other Topics Concern   Not on file  Social History Narrative   Not on file    Social Determinants of Health    Financial Resource Strain: Not on file  Food Insecurity: Not on file  Transportation Needs: Not on file  Physical Activity: Not on file  Stress: Not on file  Social Connections: Not on file      Family History: The patient's family history includes Breast cancer in her maternal aunt; Cancer in her maternal uncle; Hypertension in her mother; Stroke in her maternal  grandfather.   ROS:   Please see the history of present illness.    All other systems reviewed and are negative.   EKGs/Labs/Other Studies Reviewed:     The following studies were reviewed today:   July 21, 2021 echo Left ventricular function normal, 60% Severe apical hypertrophy Right ventricular function normal Mild to moderate LA dilation Moderate MR   June 28, 2021 ZIO monitor personally reviewed HR 22 - 107bpm, average 44 bpm. 100% atrial flutter during the monitoring period. Rare ventricular ectopy. Several pauses, with longest lasting over 6 seconds. Some pauses/bradycardic episodes occurring during daytime hours.           EKG:  The ekg ordered today demonstrates atrial flutter with a ventricular rate of 39 bpm.  QRS is narrow at 75 bpm.  The QRSs are regular raising the question of whether or not this could be complete heart block.   Recent Labs: 05/18/2021: Hemoglobin 14.1; Platelets 208; TSH 3.380  Recent Lipid Panel Labs (Brief)  No results found for: CHOL, TRIG, HDL, CHOLHDL, VLDL, LDLCALC, LDLDIRECT     Physical Exam:     VS:  BP 120/66    Pulse (!) 39    Ht 5\' 4"  (1.626 m)    Wt 156 lb 9.6 oz (71 kg)    SpO2 94%    BMI 26.88 kg/m         Wt Readings from Last 3 Encounters:  07/27/21 156 lb 9.6 oz (71 kg)  06/15/21 154 lb (69.9 kg)  05/18/21 154 lb (69.9 kg)      GEN:  Well nourished, well developed in no acute distress HEENT: Normal NECK: No JVD; No carotid bruits LYMPHATICS: No lymphadenopathy CARDIAC: Regular rhythm, bradycardic, no murmurs, rubs, gallops RESPIRATORY:  Clear to auscultation without rales, wheezing or rhonchi  ABDOMEN: Soft, non-tender, non-distended MUSCULOSKELETAL:  No edema; No deformity  SKIN: Warm and dry NEUROLOGIC:  Alert and oriented x 3 PSYCHIATRIC:  Normal affect            Assessment     ASSESSMENT:     1. Permanent atrial fibrillation ([place])   2. On Coumadin for atrial fibrillation ([place])   3.  Symptomatic bradycardia     PLAN:     In order of problems listed above:     #Symptomatic bradycardia #Atrial fibrillation/flutter, permanent  The patient has symptomatic bradycardia in the setting of permanent atrial fibrillation/flutter.  Her ventricular rate is 39 bpm.  She is active but has fatigue with minimal exertion and is  experienced lightheadedness/dizziness episodes.  Permanent pacemaker is indicated.  We will plan for a single-chamber pacemaker with a left bundle area lead from [employer].  Procedure reviewed with the patient including risk, recovery and she wishes to proceed.  She will hold her Coumadin for 4 days prior to the procedure.   Risks, benefits, alternatives to PPM implantation were discussed in detail with the patient today. The patient understands that the risks include but are not limited to bleeding, infection, pneumothorax, perforation, tamponade, vascular damage, renal failure, MI, stroke, death, and lead dislodgement and wishes to proceed.  We will therefore schedule device implantation at the next available time.                 Total time spent with patient today 45 minutes. This includes reviewing records, evaluating the patient and coordinating care.    Medication Adjustments/Labs and Tests Ordered: Current medicines are reviewed at length with the patient today.  Concerns regarding medicines are outlined above.  No orders of the defined types were placed in this encounter.   No orders of the defined types were placed in this encounter.       Signed, Lars Mage, MD, [facility], [facility] 07/27/2021 11:44 AM    Electrophysiology [place]     ---------------------------------------------------------------  I have seen, examined the patient, and reviewed the above assessment and plan.    Plan for single chamber PPM.   Vickie Epley, MD 09/08/2021 2:48 PM

## 2021-09-09 ENCOUNTER — Ambulatory Visit (HOSPITAL_COMMUNITY): Payer: Medicare Other

## 2021-09-09 ENCOUNTER — Encounter (HOSPITAL_COMMUNITY): Payer: Self-pay | Admitting: Cardiology

## 2021-09-09 DIAGNOSIS — I4892 Unspecified atrial flutter: Secondary | ICD-10-CM | POA: Diagnosis not present

## 2021-09-09 DIAGNOSIS — Z7901 Long term (current) use of anticoagulants: Secondary | ICD-10-CM | POA: Diagnosis not present

## 2021-09-09 DIAGNOSIS — R001 Bradycardia, unspecified: Secondary | ICD-10-CM | POA: Diagnosis not present

## 2021-09-09 DIAGNOSIS — Z950 Presence of cardiac pacemaker: Secondary | ICD-10-CM | POA: Diagnosis not present

## 2021-09-09 DIAGNOSIS — I4821 Permanent atrial fibrillation: Secondary | ICD-10-CM | POA: Diagnosis not present

## 2021-09-09 MED ORDER — WARFARIN SODIUM 2 MG PO TABS: 2.0000 mg | ORAL_TABLET | ORAL | Status: DC

## 2021-09-09 MED ORDER — ACETAMINOPHEN 325 MG PO TABS: 325.0000 mg | ORAL_TABLET | ORAL | Status: AC | PRN

## 2021-09-09 NOTE — Progress Notes (Signed)
Nsg Discharge Note  Admit Date:  09/08/2021 Discharge date: 09/09/2021   Sarah Sweeney to be D/C'd Home per MD order.  AVS completed. Patient/caregiver able to verbalize understanding.  Discharge Medication: Allergies as of 09/09/2021   No Known Allergies      Medication List     TAKE these medications    acetaminophen 325 MG tablet Commonly known as: TYLENOL Take 1-2 tablets (325-650 mg total) by mouth every 4 (four) hours as needed for mild pain.   atorvastatin 20 MG tablet Commonly known as: LIPITOR TAKE 1 TABLET BY MOUTH  DAILY   diclofenac Sodium 1 % Gel Commonly known as: VOLTAREN Apply 1 application topically 2 (two) times daily as needed (pain).   latanoprost 0.005 % ophthalmic solution Commonly known as: XALATAN Place 1 drop into both eyes at bedtime.   olmesartan-hydrochlorothiazide 20-12.5 MG tablet Commonly known as: BENICAR HCT Take 1 tablet by mouth daily.   omeprazole 40 MG capsule Commonly known as: PRILOSEC Take 40 mg by mouth daily.   oxybutynin 15 MG 24 hr tablet Commonly known as: DITROPAN XL Take 15 mg by mouth at bedtime.   warfarin 2 MG tablet Commonly known as: COUMADIN Take 1-1.5 tablets (2-3 mg total) by mouth See admin instructions. 2 mg on Tue and Fri, and 3 mg all other days Start taking on: September 10, 2021        Discharge Assessment: Vitals:   09/09/21 0730 09/09/21 1354  BP: (!) 149/80 (!) 167/63  Pulse: (!) 59 62  Resp: (!) 22 (!) 23  Temp: 97.7 F (36.5 C) 98.1 F (36.7 C)  SpO2:     Skin clean, dry and intact without evidence of skin break down, no evidence of skin tears noted. IV catheter discontinued intact. Site without signs and symptoms of complications - no redness or edema noted at insertion site, patient denies c/o pain - only slight tenderness at site.  Dressing with slight pressure applied.  D/c Instructions-Education: Discharge instructions given to patient/family with verbalized understanding. D/c  education completed with patient/family including follow up instructions, medication list, d/c activities limitations if indicated, with other d/c instructions as indicated by MD - patient able to verbalize understanding, all questions fully answered. Patient instructed to return to ED, call 911, or call MD for any changes in condition.  Patient escorted via WC, and D/C home via private auto.  Kizzie Bane, RN 09/09/2021 2:19 PM

## 2021-09-09 NOTE — Plan of Care (Signed)
  Problem: Clinical Measurements: Goal: Ability to maintain clinical measurements within normal limits will improve Outcome: Progressing Goal: Will remain free from infection Outcome: Progressing   Problem: Coping: Goal: Level of anxiety will decrease Outcome: Progressing   Problem: Pain Managment: Goal: General experience of comfort will improve Outcome: Progressing   Problem: Safety: Goal: Ability to remain free from injury will improve Outcome: Progressing   Problem: Skin Integrity: Goal: Risk for impaired skin integrity will decrease Outcome: Progressing   

## 2021-09-09 NOTE — Plan of Care (Signed)

## 2021-09-09 NOTE — Discharge Instructions (Signed)
After Your Pacemaker   You have a Medtronic Pacemaker  ACTIVITY Do not lift your arm above shoulder height for 1 week after your procedure. After 7 days, you may progress as below.  You should remove your sling 24 hours after your procedure, unless otherwise instructed by your provider.     Friday September 16, 2021  Saturday September 17, 2021 Sunday September 18, 2021 Monday September 19, 2021   Do not lift, push, pull, or carry anything over 10 pounds with the affected arm until 6 weeks (Friday October 21, 2021 ) after your procedure.   You may drive AFTER your wound check, unless you have been told otherwise by your provider.   Ask your healthcare provider when you can go back to work   INCISION/Dressing Resume coumadin tomorrow, 09/10/2021  If large square, outer bandage is left in place, this can be removed after 24 hours from your procedure. Do not remove steri-strips or glue as below.   Monitor your Pacemaker site for redness, swelling, and drainage. Call the device clinic at [phone] if you experience these symptoms or fever/chills.  If your incision is sealed with Steri-strips or staples, you may shower 10 days after your procedure or when told by your provider. Do not remove the steri-strips or let the shower hit directly on your site. You may wash around your site with soap and water.    If you were discharged in a sling, please do not wear this during the day more than 48 hours after your surgery unless otherwise instructed. This may increase the risk of stiffness and soreness in your shoulder.   Avoid lotions, ointments, or perfumes over your incision until it is well-healed.  You may use a hot tub or a pool AFTER your wound check appointment if the incision is completely closed.  Pacemaker Alerts:  Some alerts are vibratory and others beep. These are NOT emergencies. Please call our office to let us know. If this occurs at night or on weekends, it can wait until the next  business day. Send a remote transmission.  If your device is capable of reading fluid status (for heart failure), you will be offered monthly monitoring to review this with you.   DEVICE MANAGEMENT Remote monitoring is used to monitor your pacemaker from home. This monitoring is scheduled every 91 days by our office. It allows [place] to keep an eye on the functioning of your device to ensure it is working properly. You will routinely see your Electrophysiologist annually (more often if necessary).   You should receive your ID card for your new device in 4-8 weeks. Keep this card with you at all times once received. Consider wearing a medical alert bracelet or necklace.  Your Pacemaker may be MRI compatible. This will be discussed at your next office visit/wound check.  You should avoid contact with strong electric or magnetic fields.   Do not use amateur (ham) radio equipment or electric (arc) welding torches. MP3 player headphones with magnets should not be used. Some devices are safe to use if held at least 12 inches (30 cm) from your Pacemaker. These include power tools, lawn mowers, and speakers. If you are unsure if something is safe to use, ask your health care provider.  When using your cell phone, hold it to the ear that is on the opposite side from the Pacemaker. Do not leave your cell phone in a pocket over the Pacemaker.  You may safely use electric blankets, heating pads,  computers, and microwave ovens.  Call the office right away if: You have chest pain. You feel more short of breath than you have felt before. You feel more light-headed than you have felt before. Your incision starts to open up.  This information is not intended to replace advice given to you by your health care provider. Make sure you discuss any questions you have with your health care provider.

## 2021-09-09 NOTE — Discharge Summary (Signed)
ELECTROPHYSIOLOGY PROCEDURE DISCHARGE SUMMARY    Patient ID: Sarah Sweeney,  MRN: [id number], DOB/AGE: 09-10-1940 81 y.o.  Admit date: 09/08/2021 Discharge date: 09/09/2021  Primary Care Physician: Leonel Ramsay, MD  Primary Cardiologist: Nelva Bush, MD  Electrophysiologist: Vickie Epley, MD   Primary Discharge Diagnosis:  Symptomatic bradycardia status post pacemaker implantation this admission  Secondary Discharge Diagnosis:  Permanent atrial fibrillation and flutter  No Known Allergies   Procedures This Admission:  1.  Implantation of a Medtronic single chamber PPM on 09/08/2021 by Dr. Quentin Ore. The patient received a Medtronic model number [id number] PPM with model number [id number] right ventricular lead. There were no immediate post procedure complications. 2.  CXR on 09/09/21 demonstrated no pneumothorax status post device implantation.   Brief HPI: Sarah Sweeney is a 81 y.o. female was referred to electrophysiology in the outpatient setting for  consideration of PPM implantation.  Past medical history includes above.  The patient has had symptomatic bradycardia without reversible causes identified.  Risks, benefits, and alternatives to PPM implantation were reviewed with the patient who wished to proceed.   Hospital Course:  The patient was admitted and underwent implantation of a Medtronic single chamber PPM with details as outlined above.  She was monitored on telemetry overnight which demonstrated pacing with a narrow QRS.  Left chest was without hematoma or ecchymosis.  The device was interrogated and found to be functioning normally.  CXR was obtained and demonstrated  no pneumothorax status post device implantation.  Wound care, arm mobility, and restrictions were reviewed with the patient.  The patient was examined and considered stable for discharge to home.    Regarding blood thinner therapy, they were instructed to resume their coumadin (medication  name) on 09/10/2021 (date/time).   Physical Exam: Vitals:   09/08/21 2057 09/09/21 0046 09/09/21 0437 09/09/21 0730  BP: (!) 121/54 (!) 148/67 (!) 159/77 (!) 149/80  Pulse: 60 60 60 (!) 59  Resp:    (!) 22  Temp: 98.2 F (36.8 C) 98.7 F (37.1 C) 97.7 F (36.5 C) 97.7 F (36.5 C)  TempSrc: Oral Oral Oral Oral  SpO2: 96% 96% 96%   Weight:      Height:        GEN- The patient is well appearing, alert and oriented x 3 today.   HEENT: normocephalic, atraumatic; sclera clear, conjunctiva pink; hearing intact; oropharynx clear; neck supple, no JVP Lymph- no cervical lymphadenopathy Lungs- Clear to ausculation bilaterally, normal work of breathing.  No wheezes, rales, rhonchi Heart- Regular rate and rhythm, no murmurs, rubs or gallops, PMI not laterally displaced GI- soft, non-tender, non-distended, bowel sounds present, no hepatosplenomegaly Extremities- no clubbing, cyanosis, or edema; DP/PT/radial pulses 2+ bilaterally MS- no significant deformity or atrophy Skin- warm and dry, no rash or lesion, left chest without hematoma/ecchymosis Psych- euthymic mood, full affect Neuro- strength and sensation are intact   Labs:   Lab Results  Component Value Date   WBC 5.8 05/18/2021   HGB 14.1 05/18/2021   HCT 43.0 05/18/2021   MCV 84 05/18/2021   PLT 208 05/18/2021   No results for input(s): NA, K, CL, CO2, BUN, CREATININE, CALCIUM, PROT, BILITOT, ALKPHOS, ALT, AST, GLUCOSE in the last 168 hours.  Invalid input(s): LABALBU  Discharge Medications:  Allergies as of 09/09/2021   No Known Allergies      Medication List     TAKE these medications    acetaminophen 325 MG tablet Commonly known as: TYLENOL  Take 1-2 tablets (325-650 mg total) by mouth every 4 (four) hours as needed for mild pain.   atorvastatin 20 MG tablet Commonly known as: LIPITOR TAKE 1 TABLET BY MOUTH  DAILY   diclofenac Sodium 1 % Gel Commonly known as: VOLTAREN Apply 1 application topically 2 (two)  times daily as needed (pain).   latanoprost 0.005 % ophthalmic solution Commonly known as: XALATAN Place 1 drop into both eyes at bedtime.   olmesartan-hydrochlorothiazide 20-12.5 MG tablet Commonly known as: BENICAR HCT Take 1 tablet by mouth daily.   omeprazole 40 MG capsule Commonly known as: PRILOSEC Take 40 mg by mouth daily.   oxybutynin 15 MG 24 hr tablet Commonly known as: DITROPAN XL Take 15 mg by mouth at bedtime.   warfarin 2 MG tablet Commonly known as: COUMADIN Take 1-1.5 tablets (2-3 mg total) by mouth See admin instructions. 2 mg on Tue and Fri, and 3 mg all other days Start taking on: September 10, 2021        Disposition:    Follow-up Information     [facility] Follow up.   Why: On 2/8 at 2 pm for post hospital pacer check Contact information: [place] [phone]                Duration of Discharge Encounter: Greater than 30 minutes including physician time.  Jacalyn Lefevre, PA-C  09/09/2021 8:56 AM

## 2021-09-21 ENCOUNTER — Ambulatory Visit (INDEPENDENT_AMBULATORY_CARE_PROVIDER_SITE_OTHER): Payer: Medicare Other

## 2021-09-21 ENCOUNTER — Other Ambulatory Visit: Payer: Self-pay

## 2021-09-21 DIAGNOSIS — I422 Other hypertrophic cardiomyopathy: Secondary | ICD-10-CM | POA: Diagnosis not present

## 2021-09-21 NOTE — Patient Instructions (Addendum)
° °  After Your Pacemaker   Monitor your pacemaker site for redness, swelling, and drainage. Call the device clinic at [phone] if you experience these symptoms or fever/chills.  Your incision was closed with Steri-strips or staples:  You may shower and wash your incision with soap and water once it is completely healed. Avoid lotions, ointments, or perfumes over your incision until it is well-healed.  Do not lift, push or pull greater than 10 pounds with the affected arm until 6 weeks after your procedure. There are no other restrictions in arm movement after your wound check appointment. March 9th  You may drive, unless driving has been restricted by your healthcare providers.  Your Pacemaker is  MRI compatible.  Remote monitoring is used to monitor your pacemaker from home. This monitoring is scheduled every 91 days by our office. It allows [place] to keep an eye on the functioning of your device to ensure it is working properly. You will routinely see your Electrophysiologist annually (more often if necessary).

## 2021-09-22 LAB — CUP PACEART INCLINIC DEVICE CHECK
Battery Remaining Longevity: 152 mo
Battery Voltage: 3.19 V
Brady Statistic RV Percent Paced: 98.9 %
Date Time Interrogation Session: 20230208141700
Implantable Lead Implant Date: 20230125
Implantable Lead Location: 753860
Implantable Lead Model: 3830
Implantable Pulse Generator Implant Date: 20230125
Lead Channel Impedance Value: 399 Ohm
Lead Channel Impedance Value: 551 Ohm
Lead Channel Pacing Threshold Amplitude: 0.5 V
Lead Channel Pacing Threshold Pulse Width: 0.4 ms
Lead Channel Sensing Intrinsic Amplitude: 19 mV
Lead Channel Setting Pacing Amplitude: 3.5 V
Lead Channel Setting Pacing Pulse Width: 0.4 ms
Lead Channel Setting Sensing Sensitivity: 1.2 mV

## 2021-09-22 NOTE — Progress Notes (Signed)
Wound check appointment s/p PPM implant 09/05/21. Steri-strips removed. Wound without redness or edema. Incision edges approximated, wound well healed. Normal device function. Thresholds, sensing, and impedance consistent with implant measurements. Device programmed at 3.5V/auto capture programmed on for extra safety margin until 3 month visit. Histogram distribution appropriate for patient and level of activity. No mode switches or high ventricular rates noted. Patient educated about wound care, arm mobility, lifting restrictions. Patient enrolled in remote monitoring with next transmission scheduled 12/09/21. 91 day follow up with Dr. Lalla Brothers 12/14/21.

## 2021-10-07 ENCOUNTER — Other Ambulatory Visit: Payer: Self-pay | Admitting: Internal Medicine

## 2021-11-04 ENCOUNTER — Ambulatory Visit: Payer: Medicare Other | Admitting: Internal Medicine

## 2021-11-04 ENCOUNTER — Encounter: Payer: Self-pay | Admitting: Internal Medicine

## 2021-11-04 ENCOUNTER — Other Ambulatory Visit: Payer: Self-pay

## 2021-11-04 VITALS — BP 120/70 | HR 81 | Ht 64.0 in | Wt 154.0 lb

## 2021-11-04 DIAGNOSIS — I10 Essential (primary) hypertension: Secondary | ICD-10-CM | POA: Diagnosis not present

## 2021-11-04 DIAGNOSIS — E785 Hyperlipidemia, unspecified: Secondary | ICD-10-CM

## 2021-11-04 DIAGNOSIS — I422 Other hypertrophic cardiomyopathy: Secondary | ICD-10-CM | POA: Diagnosis not present

## 2021-11-04 DIAGNOSIS — Z950 Presence of cardiac pacemaker: Secondary | ICD-10-CM | POA: Diagnosis not present

## 2021-11-04 DIAGNOSIS — I4821 Permanent atrial fibrillation: Secondary | ICD-10-CM | POA: Diagnosis not present

## 2021-11-04 NOTE — Progress Notes (Signed)
? ?Follow-up Outpatient Visit ?Date: 11/04/2021 ? ?Primary Care Provider: ?Mick Sell, MD ?[address] ?[place] [address] ? ?Chief Complaint: Follow-up atrial fibrillation and apical HCM ? ?HPI:  Sarah Sweeney is a 80 y.o. 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  I last saw her in 05/2021, at which time she complained of fatigue and lack of energy present for several weeks.  EKG at that visit showed atrial flutter with possible junctional rhythm and ventricular rates in the upper 30s.  Ventricular rate increased to the 60s with ambulation.  She was referred to Dr. Lalla Brothers for consideration of pacemaker placement and underwent single-chamber pacemaker implantation in late January. ? ?Today, Sarah Sweeney reports that she is feeling better since undergoing PPM implantation with noticeably more energy.  She still feels the PPM when she moves her left shoulder/arm in a certain way, though it is not painful.  She denies chest pain, shortness of breath, and palpitations.  She still has occasional transient lightheadedness when she bends over, though she has not fallen or passed out.  Minimal leg edema is also stable.  She remains on warfarin managed through [facility] without any bleeding. ? ?-------------------------------------------------------------------------------------------------- ? ?Past Medical History:  ?Diagnosis Date  ? Allergy   ? Apical variant hypertrophic cardiomyopathy (HCC)   ? Atrial fibrillation and flutter (HCC)   ? Diabetes mellitus without complication (HCC)   ? Erythrocytosis  03/02/2015  ? GERD (gastroesophageal reflux disease)   ? Headache   ? Hypertension   ? Pulmonary embolism (HCC) 2013  ? Coumadin Therapy  ? Sleep apnea   ? CPAP  ? Symptomatic bradycardia   ? s/p single-chamber pacemaker (08/2021 - Dr. Lalla Brothers)  ? ?Past Surgical History:  ?Procedure Laterality Date  ? APPENDECTOMY    ? CARDIOVERSION N/A 07/17/2019  ? Procedure: CARDIOVERSION;  Surgeon: Antonieta Iba, MD;  Location: [facility] ORS;  Service: Cardiovascular;  Laterality: N/A;  ? CATARACT EXTRACTION W/PHACO Left 09/21/2020  ? Procedure: CATARACT EXTRACTION PHACO AND INTRAOCULAR LENS PLACEMENT (IOC) LEFT 4.94 00:36.2;  Surgeon: Galen Manila, MD;  Location: [facility];  Service: Ophthalmology;  Laterality: Left;  sleep apnea  ? CATARACT EXTRACTION W/PHACO Right 10/05/2020  ? Procedure: CATARACT EXTRACTION PHACO AND INTRAOCULAR LENS PLACEMENT (IOC) RIGHT 4.44 00:34.3 ;  Surgeon: Galen Manila, MD;  Location: [facility];  Service: Ophthalmology;  Laterality: Right;  Diabetic - diet controlled  ? COLONOSCOPY WITH PROPOFOL N/A 03/04/2019  ? Procedure: COLONOSCOPY WITH PROPOFOL;  Surgeon: Toledo, Boykin Nearing, MD;  Location: [facility] ENDOSCOPY;  Service: Gastroenterology;  Laterality: N/A;  ? PACEMAKER IMPLANT N/A 09/08/2021  ? Procedure: PACEMAKER IMPLANT;  Surgeon: Lanier Prude, MD;  Location: [facility] INVASIVE CV LAB;  Service: Cardiovascular;  Laterality: N/A;  ? TONSILLECTOMY    ? ? ?Recent CV Pertinent Labs: ?Lab Results  ?Component Value Date  ? INR 1.1 09/08/2021  ? INR 2.6 05/20/2012  ? BNP 7,182 (H) 05/04/2012  ? K 4.2 07/14/2019  ? K 4.3 05/06/2012  ? MG 2.2 03/04/2019  ? BUN 20 07/14/2019  ? BUN 18 06/05/2019  ? BUN 18 05/06/2012  ? CREATININE 1.01 (H) 07/14/2019  ? CREATININE 0.94 11/10/2014  ? ? ?Past medical and surgical  history were reviewed and updated in EPIC. ? ?Current Meds  ?Medication Sig  ? acetaminophen (TYLENOL) 325 MG tablet Take 1-2 tablets (325-650 mg total) by mouth every 4 (four) hours  as needed for mild pain.  ? atorvastatin (LIPITOR) 20 MG tablet TAKE 1 TABLET BY MOUTH ONCE DAILY  ? diclofenac Sodium (VOLTAREN) 1 % GEL Apply 1 application topically 2 (two) times daily as needed (pain).  ? latanoprost (XALATAN) 0.005 % ophthalmic solution Place 1 drop into both eyes at bedtime.  ? olmesartan-hydrochlorothiazide (BENICAR HCT) 20-12.5 MG tablet Take 1 tablet by mouth daily.  ? omeprazole (PRILOSEC) 40 MG capsule Take 40 mg by mouth daily.   ? oxybutynin (DITROPAN XL) 15 MG 24 hr tablet Take 15 mg by mouth at bedtime.   ? warfarin (COUMADIN) 2 MG tablet Take 1-1.5 tablets (2-3 mg total) by mouth See admin instructions. 2 mg on Tue and Fri, and 3 mg all other days  ? ? ?Allergies: Patient has no known allergies. ? ?Social History  ? ?Tobacco Use  ? Smoking status: Never  ? Smokeless tobacco: Never  ?Vaping Use  ? Vaping Use: Never used  ?Substance Use Topics  ? Alcohol use: No  ? Drug use: No  ? ? ?Family History  ?Problem Relation Age of Onset  ? Hypertension Mother   ? Breast cancer Maternal Aunt   ?     60's  ? Cancer Maternal Uncle   ? Stroke Maternal Grandfather   ? ? ?Review of Systems: ?A 12-system review of systems was performed and was negative except as noted in the HPI. ? ?-------------------------------------------------------------------------------------------------- ? ?Physical Exam: ?BP 120/70 (BP Location: Left Arm, Patient Position: Sitting, Cuff Size: Normal)   Pulse 81   Ht 5\' 4"  (1.626 m)   Wt 154 lb (69.9 kg)   SpO2 98%   BMI 26.43 kg/m?  ? ?General:  NAD. ?Neck: No JVD or HJR. ?Lungs: Clear to auscultation bilaterally without wheezes or crackles. ?Heart: Regular rate and rhythm without murmurs, rubs, or gallops. ?Abdomen: Soft, nontender, nondistended. ?Extremities: No lower extremity edema. ? ?EKG:  Ventricular paced rhythm with underlying atrial fibrillation ? ?Lab Results  ?Component Value Date  ? WBC 5.8 05/18/2021  ? HGB 14.1 05/18/2021  ? HCT 43.0 05/18/2021  ? MCV  84 05/18/2021  ? PLT 208 05/18/2021  ? ? ?Lab Results  ?Component Value Date  ? NA 142 07/14/2019  ? K 4.2 07/14/2019  ? CL 104 07/14/2019  ? CO2 27 07/14/2019  ? BUN 20 07/14/2019  ? CREATININE 1.01 (H) 07/14/2019  ? GLUCOSE 85 07/14/2019  ? ALT 18 12/12/2017  ? ? ?-------------------------------------------------------------------------------------------------- ? ?ASSESSMENT AND PLAN: ?Atrial fibrillation with slow ventricular response s/p PPM: ?Sarah Sweeney is feeling better following PPM implantation.  We will continue indefinite anticoagulation with warfarin, as she has been tolerating this well and is concerned about cost of switching to a DOAC.  Ongoing follow-up of PPM per Dr. Andrey Campanile and the device clinic. ? ?Apical HCM: ?No heart failure symptoms reported.  Sarah Sweeney appears euvolemic on exam.  We will defer medication changes at this time.  I encouraged her to avoid getting dehydrated, as this could exacerbated symptoms of HCM, such as lightheadedness. ? ?Hypertension: ?BP well-controlled today.  No medications recommended at this time. ? ?Hyperlipidemia: ?Ongoing management per Dr. Andrey Campanile. ? ?Follow-up: Return to clinic in 6 months. ? ?Sampson Goon, MD ?11/04/2021 ?9:50 AM ? ?

## 2021-11-04 NOTE — Patient Instructions (Signed)
Medication Instructions:  ° °Your physician recommends that you continue on your current medications as directed. Please refer to the Current Medication list given to you today. ° °*If you need a refill on your cardiac medications before your next appointment, please call your pharmacy* ° ° °Lab Work: ° °None ordered ° °Testing/Procedures: ° °None ordered ° ° °Follow-Up: °At [facility], you and your health needs are our priority.  As part of our continuing mission to provide you with exceptional heart care, we have created designated Provider Care Teams.  These Care Teams include your primary Cardiologist (physician) and Advanced Practice Providers (APPs -  Physician Assistants and Nurse Practitioners) who all work together to provide you with the care you need, when you need it. ° °We recommend signing up for the patient portal called "MyChart".  Sign up information is provided on this After Visit Summary.  MyChart is used to connect with patients for Virtual Visits (Telemedicine).  Patients are able to view lab/test results, encounter notes, upcoming appointments, etc.  Non-urgent messages can be sent to your provider as well.   °To learn more about what you can do with MyChart, go to [email or web address].   ° °Your next appointment:   °6 month(s) ° °The format for your next appointment:   °In Person ° °Provider:   °You may see Christopher End, MD or one of the following Advanced Practice Providers on your designated Care Team:   °Christopher Berge, NP °Ryan Dunn, PA-C °Cadence Furth, PA-C °

## 2021-11-05 ENCOUNTER — Encounter: Payer: Self-pay | Admitting: Internal Medicine

## 2021-11-05 DIAGNOSIS — E1169 Type 2 diabetes mellitus with other specified complication: Secondary | ICD-10-CM | POA: Insufficient documentation

## 2021-11-05 DIAGNOSIS — I4821 Permanent atrial fibrillation: Secondary | ICD-10-CM | POA: Insufficient documentation

## 2021-11-05 DIAGNOSIS — E785 Hyperlipidemia, unspecified: Secondary | ICD-10-CM | POA: Insufficient documentation

## 2021-12-01 ENCOUNTER — Ambulatory Visit: Payer: Medicare Other | Admitting: Pulmonary Disease

## 2021-12-01 ENCOUNTER — Encounter: Payer: Self-pay | Admitting: Pulmonary Disease

## 2021-12-01 VITALS — BP 112/62 | HR 91 | Temp 97.8°F | Ht 64.0 in | Wt 154.6 lb

## 2021-12-01 DIAGNOSIS — Z9989 Dependence on other enabling machines and devices: Secondary | ICD-10-CM

## 2021-12-01 DIAGNOSIS — G4733 Obstructive sleep apnea (adult) (pediatric): Secondary | ICD-10-CM | POA: Diagnosis not present

## 2021-12-01 NOTE — Patient Instructions (Signed)
Will arrange for CPAP mask refitting ? ?You can also look up CPAP mask options on-line at [email or web address].com or similar website ? ?Follow up in 1 year ?

## 2021-12-01 NOTE — Progress Notes (Signed)
? ?[facility] ? ?Chief Complaint  ?Patient presents with  ? Follow-up  ?  Wearing cpap avg 7hr nightly- feels mask is leaking.   ? ? ?Constitutional:  ?BP 112/62 (BP Location: Left Arm, Cuff Size: Normal)   Pulse 91   Temp 97.8 ?F (36.6 ?C) (Temporal)   Ht 5\' 4"  (1.626 m)   Wt 154 lb 9.6 oz (70.1 kg)   SpO2 98%   BMI 26.54 kg/m?  ? ?Past Medical History:  ?DM, GERD, HA, HTN, PE 2013, Persistent A fib, Hypertrophic CM, non sustained VT ? ?Past Surgical History:  ?She  has a past surgical history that includes Appendectomy; Tonsillectomy; Colonoscopy with propofol (N/A, 03/04/2019); Cardioversion (N/A, 07/17/2019); Cataract extraction w/PHACO (Left, 09/21/2020); Cataract extraction w/PHACO (Right, 10/05/2020); and PACEMAKER IMPLANT (N/A, 09/08/2021). ? ?Brief Summary:  ?Sarah Sweeney is a 81 y.o. female with obstructive sleep apnea. ?  ? ? ? ?Subjective:  ? ?She had pacemaker inserted in January.  She has noticed improved in her energy and her sleep since getting pacer inserted. ? ?Uses CPAP nightly.  Has nasal cushion mask.  Has trouble with air leaking into her eyes sometimes from mask.  Otherwise CPAP works well. ? ?Physical Exam:  ? ?Appearance - well kempt  ? ?ENMT - no sinus tenderness, no oral exudate, no LAN, Mallampati 3 airway, no stridor ? ?Respiratory - equal breath sounds bilaterally, no wheezing or rales ? ?CV - s1s2 regular rate and rhythm, no murmurs ? ?Ext - no clubbing, no edema ? ?Skin - no rashes ? ?Psych - normal mood and affect ? ?  ?Chest Imaging:  ?CT chest 10/18/15 >> LLL nodule stable since 2014, coronary calcification, cholelithiasis ? ?Sleep Tests:  ?HST 11/04/19 >> AHI 35.7, SpO2 low 81% ?Auto CPAP 10/31/21 to 11/29/21 >> used on 30 of 30 nights with average 7 hrs 20 min.  Average AHI 2.5 with median CPAP 12 and 95 th percentile CPAP 14 cm H2O ? ?Cardiac Tests:  ?Echo 07/21/21 >> EF 60 to 65%, mild/mod LVH, RVSP 46.2 mmHg, mild/mod LA dilation, mod  MR ? ?Social History:  ?She  reports that she has never smoked. She has never used smokeless tobacco. She reports that she does not drink alcohol and does not use drugs. ? ?Family History:  ?Her family history includes Breast cancer in her maternal aunt; Cancer in her maternal uncle; Hypertension in her mother; Stroke in her maternal grandfather. ?  ? ? ?Assessment/Plan:  ? ?Obstructive sleep apnea. ?- she is compliant with CPAP and reports benefit from therapy ?- she uses [employer] for her DME ?- continue auto CPAP 5 to 15 cm H2O ?- will have her DME refit her mask to see if this reduces air leak ?- she can also look up mask options on-line ?- discussed Inspire device ? ?Chronic atrial fibrillation. ?- followed by Dr. Harrell Gave End with [facility] ? ? ?Time Spent Involved in Patient Care on Day of Examination:  ?26 minutes ? ?Follow up:  ? ?Patient Instructions  ?Will arrange for CPAP mask refitting ? ?You can also look up CPAP mask options on-line at [email or web address].[email or web address] or similar website ? ?Follow up in 1 year ? ?Medication List:  ? ?Allergies as of 12/01/2021   ?No Known Allergies ?  ? ?  ?Medication List  ?  ? ?  ? Accurate as of December 01, 2021  9:16 AM. If you have any questions, ask your nurse or doctor.  ?  ?  ? ?  ? ?  acetaminophen 325 MG tablet ?Commonly known as: TYLENOL ?Take 1-2 tablets (325-650 mg total) by mouth every 4 (four) hours as needed for mild pain. ?  ?atorvastatin 20 MG tablet ?Commonly known as: LIPITOR ?TAKE 1 TABLET BY MOUTH ONCE DAILY ?  ?diclofenac Sodium 1 % Gel ?Commonly known as: VOLTAREN ?Apply 1 application topically 2 (two) times daily as needed (pain). ?  ?latanoprost 0.005 % ophthalmic solution ?Commonly known as: XALATAN ?Place 1 drop into both eyes at bedtime. ?  ?olmesartan-hydrochlorothiazide 20-12.5 MG tablet ?Commonly known as: BENICAR HCT ?Take 1 tablet by mouth daily. ?  ?omeprazole 40 MG capsule ?Commonly known as: PRILOSEC ?Take 40 mg by mouth daily. ?  ?oxybutynin 15 MG 24 hr  tablet ?Commonly known as: DITROPAN XL ?Take 15 mg by mouth at bedtime. ?  ?warfarin 2 MG tablet ?Commonly known as: COUMADIN ?Take 1-1.5 tablets (2-3 mg total) by mouth See admin instructions. 2 mg on Tue and Fri, and 3 mg all other days ?  ? ?  ? ? ?Signature:  ?Chesley Mires, MD ?[place] ?Pager - [phone] ?12/01/2021, 9:16 AM ?  ? ? ? ? ? ? ? ? ?

## 2021-12-09 ENCOUNTER — Ambulatory Visit (INDEPENDENT_AMBULATORY_CARE_PROVIDER_SITE_OTHER): Payer: Medicare Other

## 2021-12-09 DIAGNOSIS — I4821 Permanent atrial fibrillation: Secondary | ICD-10-CM | POA: Diagnosis not present

## 2021-12-09 LAB — CUP PACEART REMOTE DEVICE CHECK
Battery Remaining Longevity: 115 mo
Battery Voltage: 3.13 V
Brady Statistic RV Percent Paced: 99.71 %
Date Time Interrogation Session: 20230428001331
Implantable Lead Implant Date: 20230125
Implantable Lead Location: 753860
Implantable Lead Model: 3830
Implantable Pulse Generator Implant Date: 20230125
Lead Channel Impedance Value: 380 Ohm
Lead Channel Impedance Value: 532 Ohm
Lead Channel Pacing Threshold Amplitude: 0.5 V
Lead Channel Pacing Threshold Pulse Width: 0.4 ms
Lead Channel Sensing Intrinsic Amplitude: 17.125 mV
Lead Channel Sensing Intrinsic Amplitude: 17.125 mV
Lead Channel Setting Pacing Amplitude: 3.25 V
Lead Channel Setting Pacing Pulse Width: 0.4 ms
Lead Channel Setting Sensing Sensitivity: 1.2 mV

## 2021-12-11 NOTE — Progress Notes (Signed)
?Electrophysiology Office Follow up Visit Note:   ? ?Date:  12/14/2021  ? ?ID:  Sarah Sweeney, DOB 11/15/1940, MRN [id number] ? ?PCP:  Leonel Ramsay, MD  ?[facility] Cardiologist:  Nelva Bush, MD  ?[facility] Electrophysiologist:  Vickie Epley, MD  ? ? ?Interval History:   ? ?Sarah Sweeney is a 81 y.o. female who presents for a follow up visit after a PPM was implanted 09/08/2021. I last saw her in clinic 07/27/2021. She has permanent atrial fibrillation and previously was fatigued due to slow ventricular rates. ? ?Since implant of her PPM, she has felt better with improved energy levels.  Incision is healed well. ? ? ? ?  ? ?Past Medical History:  ?Diagnosis Date  ? Allergy   ? Apical variant hypertrophic cardiomyopathy ([place])   ? Atrial fibrillation and flutter ([place])   ? Diabetes mellitus without complication ([place])   ? Erythrocytosis 03/02/2015  ? GERD (gastroesophageal reflux disease)   ? Headache   ? Hypertension   ? Pulmonary embolism ([place]) 2013  ? Coumadin Therapy  ? Sleep apnea   ? CPAP  ? Symptomatic bradycardia   ? s/p single-chamber pacemaker (08/2021 - Dr. Quentin Ore)  ? ? ?Past Surgical History:  ?Procedure Laterality Date  ? APPENDECTOMY    ? CARDIOVERSION N/A 07/17/2019  ? Procedure: CARDIOVERSION;  Surgeon: Minna Merritts, MD;  Location: [facility];  Service: Cardiovascular;  Laterality: N/A;  ? CATARACT EXTRACTION W/PHACO Left 09/21/2020  ? Procedure: CATARACT EXTRACTION PHACO AND INTRAOCULAR LENS PLACEMENT (IOC) LEFT 4.94 00:36.2;  Surgeon: Birder Robson, MD;  Location: [facility];  Service: Ophthalmology;  Laterality: Left;  sleep apnea  ? CATARACT EXTRACTION W/PHACO Right 10/05/2020  ? Procedure: CATARACT EXTRACTION PHACO AND INTRAOCULAR LENS PLACEMENT (IOC) RIGHT 4.44 00:34.3 ;  Surgeon: Birder Robson, MD;  Location: [facility];  Service: Ophthalmology;  Laterality: Right;  Diabetic - diet controlled  ? COLONOSCOPY WITH PROPOFOL N/A 03/04/2019  ?  Procedure: COLONOSCOPY WITH PROPOFOL;  Surgeon: Toledo, Benay Pike, MD;  Location: [facility];  Service: Gastroenterology;  Laterality: N/A;  ? PACEMAKER IMPLANT N/A 09/08/2021  ? Procedure: PACEMAKER IMPLANT;  Surgeon: Vickie Epley, MD;  Location: [facility];  Service: Cardiovascular;  Laterality: N/A;  ? TONSILLECTOMY    ? ? ?Current Medications: ?Current Meds  ?Medication Sig  ? acetaminophen (TYLENOL) 325 MG tablet Take 1-2 tablets (325-650 mg total) by mouth every 4 (four) hours as needed for mild pain.  ? atorvastatin (LIPITOR) 20 MG tablet TAKE 1 TABLET BY MOUTH ONCE DAILY  ? diclofenac Sodium (VOLTAREN) 1 % GEL Apply 1 application topically 2 (two) times daily as needed (pain).  ? latanoprost (XALATAN) 0.005 % ophthalmic solution Place 1 drop into both eyes at bedtime.  ? olmesartan-hydrochlorothiazide (BENICAR HCT) 20-12.5 MG tablet Take 1 tablet by mouth daily.  ? omeprazole (PRILOSEC) 40 MG capsule Take 40 mg by mouth daily.   ? oxybutynin (DITROPAN XL) 15 MG 24 hr tablet Take 15 mg by mouth at bedtime.   ? warfarin (COUMADIN) 2 MG tablet Take 1-1.5 tablets (2-3 mg total) by mouth See admin instructions. 2 mg on Tue and Fri, and 3 mg all other days  ?  ? ?Allergies:   Patient has no known allergies.  ? ?Social History  ? ?Socioeconomic History  ? Marital status: Single  ?  Spouse name: Not on file  ? Number of children: Not on file  ? Years of education: Not  on file  ? Highest education level: Not on file  ?Occupational History  ? Not on file  ?Tobacco Use  ? Smoking status: Never  ? Smokeless tobacco: Never  ?Vaping Use  ? Vaping Use: Never used  ?Substance and Sexual Activity  ? Alcohol use: No  ? Drug use: No  ? Sexual activity: Not Currently  ?Other Topics Concern  ? Not on file  ?Social History Narrative  ? Not on file  ? ?Social Determinants of Health  ? ?Financial Resource Strain: Not on file  ?Food Insecurity: Not on file  ?Transportation Needs: Not on file  ?Physical Activity: Not  on file  ?Stress: Not on file  ?Social Connections: Not on file  ?  ? ?Family History: ?The patient's family history includes Breast cancer in her maternal aunt; Cancer in her maternal uncle; Hypertension in her mother; Stroke in her maternal grandfather. ? ?ROS:   ?Please see the history of present illness.    ?All other systems reviewed and are negative. ? ?EKGs/Labs/Other Studies Reviewed:   ? ?The following studies were reviewed today: ? ?Dec 14, 2021 in clinic device interrogation personally reviewed ?Lead parameter stable ?Battery longevity greater than 10 years ?Activity level has increased gradually since implant of pacemaker ?99.7% ventricular pacing ? ?EKG:  The ekg ordered today demonstrates atrial fibrillation.  Ventricular pacing with a QRS less than 120 ms. ? ?Recent Labs: ?05/18/2021: Hemoglobin 14.1; Platelets 208; TSH 3.380  ?Recent Lipid Panel ?No results found for: CHOL, TRIG, HDL, CHOLHDL, VLDL, LDLCALC, LDLDIRECT ? ?Physical Exam:   ? ?VS:  BP 122/66   Pulse 84   Ht 5\' 4"  (1.626 m)   Wt 155 lb 6.4 oz (70.5 kg)   SpO2 95%   BMI 26.67 kg/m?    ? ?Wt Readings from Last 3 Encounters:  ?12/14/21 155 lb 6.4 oz (70.5 kg)  ?12/01/21 154 lb 9.6 oz (70.1 kg)  ?11/04/21 154 lb (69.9 kg)  ?  ? ?GEN:  Well nourished, well developed in no acute distress ?HEENT: Normal ?NECK: No JVD; No carotid bruits ?LYMPHATICS: No lymphadenopathy ?CARDIAC: RRR, no murmurs, rubs, gallops.  Pacemaker pocket well-healed ?RESPIRATORY:  Clear to auscultation without rales, wheezing or rhonchi  ?ABDOMEN: Soft, non-tender, non-distended ?MUSCULOSKELETAL:  No edema; No deformity  ?SKIN: Warm and dry ?NEUROLOGIC:  Alert and oriented x 3 ?PSYCHIATRIC:  Normal affect  ? ? ? ?  ? ?ASSESSMENT:   ? ?1. Persistent atrial fibrillation ([place])   ?2. Apical variant hypertrophic cardiomyopathy ([place])   ?3. S/P placement of cardiac pacemaker   ? ?PLAN:   ? ?In order of problems listed above: ? ? ?#Permanent atrial fibrillation ?#Symptomatic  bradycardia ?On Coumadin for stroke prophylaxis.  Pacemaker functioning appropriately.  No changes made today with programming. ? ?#Apical variant hypertrophic cardiomyopathy ?Doing well.  Blood pressure controlled.  On Coumadin for stroke prophylaxis. ? ? ? ?Medication Adjustments/Labs and Tests Ordered: ?Current medicines are reviewed at length with the patient today.  Concerns regarding medicines are outlined above.  ?No orders of the defined types were placed in this encounter. ? ?No orders of the defined types were placed in this encounter. ? ? ? ?Signed, ?Lars Mage, MD, [facility], [facility] ?12/14/2021 11:17 AM    ?Electrophysiology ?[place] ?

## 2021-12-14 ENCOUNTER — Ambulatory Visit (INDEPENDENT_AMBULATORY_CARE_PROVIDER_SITE_OTHER): Payer: Medicare Other | Admitting: Cardiology

## 2021-12-14 ENCOUNTER — Encounter: Payer: Self-pay | Admitting: Cardiology

## 2021-12-14 ENCOUNTER — Other Ambulatory Visit: Payer: Self-pay | Admitting: Internal Medicine

## 2021-12-14 VITALS — BP 122/66 | HR 84 | Ht 64.0 in | Wt 155.4 lb

## 2021-12-14 DIAGNOSIS — Z950 Presence of cardiac pacemaker: Secondary | ICD-10-CM

## 2021-12-14 DIAGNOSIS — I4819 Other persistent atrial fibrillation: Secondary | ICD-10-CM

## 2021-12-14 DIAGNOSIS — I422 Other hypertrophic cardiomyopathy: Secondary | ICD-10-CM

## 2021-12-14 NOTE — Patient Instructions (Signed)
Medications: Your physician recommends that you continue on your current medications as directed. Please refer to the Current Medication list given to you today. *If you need a refill on your cardiac medications before your next appointment, please call your pharmacy*  Lab Work: None. If you have labs (blood work) drawn today and your tests are completely normal, you will receive your results only by: MyChart Message (if you have MyChart) OR A paper copy in the mail If you have any lab test that is abnormal or we need to change your treatment, we will call you to review the results.  Testing/Procedures: None.  Follow-Up: At [facility], you and your health needs are our priority.  As part of our continuing mission to provide you with exceptional heart care, we have created designated Provider Care Teams.  These Care Teams include your primary Cardiologist (physician) and Advanced Practice Providers (APPs -  Physician Assistants and Nurse Practitioners) who all work together to provide you with the care you need, when you need it.  Your physician wants you to follow-up in: 12 months with one of the following Advanced Practice Providers on your designated Care Team:    Chris Berge, NP Ryan Dunn PA Cadence Furth PA    You will receive a reminder letter in the mail two months in advance. If you don't receive a letter, please call our office to schedule the follow-up appointment.  We recommend signing up for the patient portal called "MyChart".  Sign up information is provided on this After Visit Summary.  MyChart is used to connect with patients for Virtual Visits (Telemedicine).  Patients are able to view lab/test results, encounter notes, upcoming appointments, etc.  Non-urgent messages can be sent to your provider as well.   To learn more about what you can do with MyChart, go to [email or web address]://www.mychart.com.    Any Other Special Instructions Will Be Listed Below (If Applicable).  

## 2021-12-23 ENCOUNTER — Other Ambulatory Visit: Payer: Self-pay | Admitting: Internal Medicine

## 2021-12-23 ENCOUNTER — Other Ambulatory Visit: Payer: Self-pay | Admitting: Pulmonary Disease

## 2021-12-23 DIAGNOSIS — G4733 Obstructive sleep apnea (adult) (pediatric): Secondary | ICD-10-CM

## 2021-12-23 NOTE — Progress Notes (Signed)
Remote pacemaker transmission.   

## 2021-12-23 NOTE — Progress Notes (Signed)
Needs replacement water chamber for CPAP machine. ?

## 2022-01-03 ENCOUNTER — Other Ambulatory Visit: Payer: Self-pay | Admitting: Internal Medicine

## 2022-01-29 ENCOUNTER — Other Ambulatory Visit: Payer: Self-pay | Admitting: Internal Medicine

## 2022-02-14 ENCOUNTER — Emergency Department: Payer: Medicare Other

## 2022-02-14 ENCOUNTER — Observation Stay
Admission: EM | Admit: 2022-02-14 | Discharge: 2022-02-15 | Disposition: A | Discharge status: Home / Self Care | Payer: Medicare Other | Attending: Emergency Medicine | Admitting: Emergency Medicine

## 2022-02-14 ENCOUNTER — Other Ambulatory Visit: Payer: Self-pay

## 2022-02-14 DIAGNOSIS — M50322 Other cervical disc degeneration at C5-C6 level: Secondary | ICD-10-CM | POA: Diagnosis not present

## 2022-02-14 DIAGNOSIS — Z9181 History of falling: Secondary | ICD-10-CM | POA: Insufficient documentation

## 2022-02-14 DIAGNOSIS — I4891 Unspecified atrial fibrillation: Secondary | ICD-10-CM | POA: Diagnosis not present

## 2022-02-14 DIAGNOSIS — R55 Syncope and collapse: Principal | ICD-10-CM | POA: Insufficient documentation

## 2022-02-14 DIAGNOSIS — I421 Obstructive hypertrophic cardiomyopathy: Secondary | ICD-10-CM | POA: Diagnosis not present

## 2022-02-14 DIAGNOSIS — E041 Nontoxic single thyroid nodule: Secondary | ICD-10-CM | POA: Insufficient documentation

## 2022-02-14 DIAGNOSIS — Z79899 Other long term (current) drug therapy: Secondary | ICD-10-CM | POA: Insufficient documentation

## 2022-02-14 DIAGNOSIS — Y9301 Activity, walking, marching and hiking: Secondary | ICD-10-CM | POA: Insufficient documentation

## 2022-02-14 DIAGNOSIS — Z86711 Personal history of pulmonary embolism: Secondary | ICD-10-CM | POA: Insufficient documentation

## 2022-02-14 DIAGNOSIS — M2578 Osteophyte, vertebrae: Secondary | ICD-10-CM | POA: Insufficient documentation

## 2022-02-14 DIAGNOSIS — R011 Cardiac murmur, unspecified: Secondary | ICD-10-CM | POA: Insufficient documentation

## 2022-02-14 DIAGNOSIS — R5383 Other fatigue: Secondary | ICD-10-CM | POA: Insufficient documentation

## 2022-02-14 DIAGNOSIS — E1122 Type 2 diabetes mellitus with diabetic chronic kidney disease: Secondary | ICD-10-CM | POA: Diagnosis not present

## 2022-02-14 DIAGNOSIS — R911 Solitary pulmonary nodule: Secondary | ICD-10-CM | POA: Insufficient documentation

## 2022-02-14 DIAGNOSIS — I5032 Chronic diastolic (congestive) heart failure: Secondary | ICD-10-CM | POA: Diagnosis not present

## 2022-02-14 DIAGNOSIS — M47812 Spondylosis without myelopathy or radiculopathy, cervical region: Secondary | ICD-10-CM | POA: Insufficient documentation

## 2022-02-14 DIAGNOSIS — D751 Secondary polycythemia: Secondary | ICD-10-CM | POA: Insufficient documentation

## 2022-02-14 DIAGNOSIS — M4802 Spinal stenosis, cervical region: Secondary | ICD-10-CM | POA: Insufficient documentation

## 2022-02-14 DIAGNOSIS — Z7901 Long term (current) use of anticoagulants: Secondary | ICD-10-CM | POA: Diagnosis not present

## 2022-02-14 DIAGNOSIS — R778 Other specified abnormalities of plasma proteins: Secondary | ICD-10-CM | POA: Insufficient documentation

## 2022-02-14 DIAGNOSIS — K219 Gastro-esophageal reflux disease without esophagitis: Secondary | ICD-10-CM | POA: Insufficient documentation

## 2022-02-14 DIAGNOSIS — G4733 Obstructive sleep apnea (adult) (pediatric): Secondary | ICD-10-CM | POA: Insufficient documentation

## 2022-02-14 DIAGNOSIS — I130 Hypertensive heart and chronic kidney disease with heart failure and stage 1 through stage 4 chronic kidney disease, or unspecified chronic kidney disease: Secondary | ICD-10-CM | POA: Insufficient documentation

## 2022-02-14 DIAGNOSIS — W1830XA Fall on same level, unspecified, initial encounter: Secondary | ICD-10-CM | POA: Insufficient documentation

## 2022-02-14 DIAGNOSIS — Z8249 Family history of ischemic heart disease and other diseases of the circulatory system: Secondary | ICD-10-CM | POA: Diagnosis not present

## 2022-02-14 DIAGNOSIS — Z950 Presence of cardiac pacemaker: Secondary | ICD-10-CM | POA: Diagnosis not present

## 2022-02-14 DIAGNOSIS — M50323 Other cervical disc degeneration at C6-C7 level: Secondary | ICD-10-CM | POA: Insufficient documentation

## 2022-02-14 DIAGNOSIS — N1831 Chronic kidney disease, stage 3a: Secondary | ICD-10-CM | POA: Diagnosis not present

## 2022-02-14 DIAGNOSIS — R42 Dizziness and giddiness: Secondary | ICD-10-CM | POA: Insufficient documentation

## 2022-02-14 DIAGNOSIS — I4510 Unspecified right bundle-branch block: Secondary | ICD-10-CM | POA: Insufficient documentation

## 2022-02-14 DIAGNOSIS — Y92099 Unspecified place in other non-institutional residence as the place of occurrence of the external cause: Secondary | ICD-10-CM | POA: Insufficient documentation

## 2022-02-14 DIAGNOSIS — R5381 Other malaise: Secondary | ICD-10-CM | POA: Insufficient documentation

## 2022-02-14 DIAGNOSIS — I422 Other hypertrophic cardiomyopathy: Secondary | ICD-10-CM | POA: Diagnosis not present

## 2022-02-14 LAB — CBC WITH DIFFERENTIAL/PLATELET
Abs Immature Granulocytes: 0.01 10*3/uL (ref 0.00–0.07)
Basophils Absolute: 0.1 10*3/uL (ref 0.0–0.1)
Basophils Relative: 1 %
Eosinophils Absolute: 0.2 10*3/uL (ref 0.0–0.5)
Eosinophils Relative: 3 %
HCT: 46.1 % — ABNORMAL HIGH (ref 36.0–46.0)
Hemoglobin: 14.9 g/dL (ref 12.0–15.0)
Immature Granulocytes: 0 %
Lymphocytes Relative: 23 %
Lymphs Abs: 1.4 10*3/uL (ref 0.7–4.0)
MCH: 29 pg (ref 26.0–34.0)
MCHC: 32.3 g/dL (ref 30.0–36.0)
MCV: 89.9 fL (ref 80.0–100.0)
Monocytes Absolute: 0.6 10*3/uL (ref 0.1–1.0)
Monocytes Relative: 10 %
Neutro Abs: 3.8 10*3/uL (ref 1.7–7.7)
Neutrophils Relative %: 63 %
Platelets: 194 10*3/uL (ref 150–400)
RBC: 5.13 MIL/uL — ABNORMAL HIGH (ref 3.87–5.11)
RDW: 13.3 % (ref 11.5–15.5)
WBC: 6 10*3/uL (ref 4.0–10.5)
nRBC: 0 % (ref 0.0–0.2)

## 2022-02-14 LAB — URINALYSIS, ROUTINE W REFLEX MICROSCOPIC
Bilirubin Urine: NEGATIVE
Glucose, UA: NEGATIVE mg/dL
Hgb urine dipstick: NEGATIVE
Ketones, ur: NEGATIVE mg/dL
Leukocytes,Ua: NEGATIVE
Nitrite: NEGATIVE
Protein, ur: NEGATIVE mg/dL
Specific Gravity, Urine: 1.005 (ref 1.005–1.030)
pH: 6 (ref 5.0–8.0)

## 2022-02-14 LAB — BASIC METABOLIC PANEL
Anion gap: 7 (ref 5–15)
BUN: 17 mg/dL (ref 8–23)
CO2: 29 mmol/L (ref 22–32)
Calcium: 9.6 mg/dL (ref 8.9–10.3)
Chloride: 101 mmol/L (ref 98–111)
Creatinine, Ser: 0.96 mg/dL (ref 0.44–1.00)
GFR, Estimated: 60 mL/min — ABNORMAL LOW (ref >60)
Glucose, Bld: 116 mg/dL — ABNORMAL HIGH (ref 70–99)
Potassium: 4.9 mmol/L (ref 3.5–5.1)
Sodium: 137 mmol/L (ref 135–145)

## 2022-02-14 LAB — TROPONIN I (HIGH SENSITIVITY)
Troponin I (High Sensitivity): 26 ng/L — ABNORMAL HIGH (ref <18)
Troponin I (High Sensitivity): 27 ng/L — ABNORMAL HIGH (ref <18)

## 2022-02-14 LAB — CK: Total CK: 81 U/L (ref 38–234)

## 2022-02-14 LAB — TSH: TSH: 2.936 u[IU]/mL (ref 0.350–4.500)

## 2022-02-14 LAB — BRAIN NATRIURETIC PEPTIDE: B Natriuretic Peptide: 271.2 pg/mL — ABNORMAL HIGH (ref 0.0–100.0)

## 2022-02-14 LAB — PROTIME-INR
INR: 1.9 — ABNORMAL HIGH (ref 0.8–1.2)
Prothrombin Time: 22 seconds — ABNORMAL HIGH (ref 11.4–15.2)

## 2022-02-14 LAB — LACTIC ACID, PLASMA: Lactic Acid, Venous: 1 mmol/L (ref 0.5–1.9)

## 2022-02-14 MED ORDER — ONDANSETRON HCL 4 MG/2ML IJ SOLN: 4.0000 mg | Freq: Four times a day (QID) | INTRAMUSCULAR | Status: DC | PRN

## 2022-02-14 MED ORDER — WARFARIN SODIUM 3 MG PO TABS
3.0000 mg | ORAL_TABLET | ORAL | Status: DC
  Administered 2022-02-14: 3 mg via ORAL
  Filled 2022-02-14: qty 1

## 2022-02-14 MED ORDER — ACETAMINOPHEN 650 MG RE SUPP: 650.0000 mg | Freq: Four times a day (QID) | RECTAL | Status: DC | PRN

## 2022-02-14 MED ORDER — ATORVASTATIN CALCIUM 20 MG PO TABS
20.0000 mg | ORAL_TABLET | Freq: Every day | ORAL | Status: DC
  Administered 2022-02-15: 20 mg via ORAL
  Filled 2022-02-14: qty 1

## 2022-02-14 MED ORDER — LOSARTAN POTASSIUM 50 MG PO TABS
50.0000 mg | ORAL_TABLET | Freq: Every day | ORAL | Status: DC
  Administered 2022-02-15: 50 mg via ORAL
  Filled 2022-02-14: qty 1

## 2022-02-14 MED ORDER — WARFARIN SODIUM 2 MG PO TABS: 2.0000 mg | ORAL_TABLET | Freq: Every day | ORAL | Status: DC

## 2022-02-14 MED ORDER — ACETAMINOPHEN 325 MG PO TABS: 650.0000 mg | ORAL_TABLET | Freq: Four times a day (QID) | ORAL | Status: DC | PRN

## 2022-02-14 MED ORDER — OXYBUTYNIN CHLORIDE ER 5 MG PO TB24
15.0000 mg | ORAL_TABLET | Freq: Every day | ORAL | Status: DC
  Filled 2022-02-14 (×2): qty 1

## 2022-02-14 MED ORDER — WARFARIN - PHARMACIST DOSING INPATIENT
Freq: Every day | Status: DC
  Filled 2022-02-14: qty 1

## 2022-02-14 MED ORDER — DICLOFENAC SODIUM 1 % EX GEL
2.0000 g | Freq: Two times a day (BID) | CUTANEOUS | Status: DC | PRN
Start: 2022-02-14 — End: 2022-02-15

## 2022-02-14 MED ORDER — LACTATED RINGERS IV SOLN: INTRAVENOUS | Status: AC

## 2022-02-14 MED ORDER — ENOXAPARIN SODIUM 40 MG/0.4ML IJ SOSY: 40.0000 mg | PREFILLED_SYRINGE | INTRAMUSCULAR | Status: DC

## 2022-02-14 MED ORDER — ONDANSETRON HCL 4 MG PO TABS: 4.0000 mg | ORAL_TABLET | Freq: Four times a day (QID) | ORAL | Status: DC | PRN

## 2022-02-14 MED ORDER — WARFARIN SODIUM 2 MG PO TABS
2.0000 mg | ORAL_TABLET | ORAL | Status: DC
  Filled 2022-02-14: qty 1

## 2022-02-14 MED ORDER — PANTOPRAZOLE SODIUM 40 MG PO TBEC
40.0000 mg | DELAYED_RELEASE_TABLET | Freq: Every day | ORAL | Status: DC
  Administered 2022-02-15: 40 mg via ORAL
  Filled 2022-02-14: qty 1

## 2022-02-14 MED ORDER — SENNOSIDES-DOCUSATE SODIUM 8.6-50 MG PO TABS: 1.0000 | ORAL_TABLET | Freq: Every evening | ORAL | Status: DC | PRN

## 2022-02-14 NOTE — H&P (Addendum)
History and Physical    Patient: Sarah Sweeney [id number] DOB: Apr 04, 1941 DOA: 02/14/2022 DOS: the patient was seen and examined on 02/14/2022 PCP: Leonel Ramsay, MD  Patient coming from: Home  Chief Complaint:  Chief Complaint  Patient presents with   Loss of Consciousness   HPI: Sarah Sweeney is a 81 y.o. 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  She stepped out of the shower and felt light-headed and Sweeney "I felt like I was going to pass out."  She sat down and Sweeney "I started seeing stars."  She stood up to walk back to the bathroom and passed out once again.  Sarah Sweeney she felt nauseous and lightheaded prior to passing out.  She denies any chest pain or shortness of breath.  Sarah Sweeney she had a similar episode one week ago.  Of note, in 2021 patient was seen by her Cardiologist Dr. Harrell Gave End who wrote "Lightheadedness could be caused by or exacerbated by hypertrophic cardiomyopathy."     Review of Systems: As mentioned in the history of present illness. All other systems reviewed and are negative. Past Medical History:  Diagnosis Date   Allergy    Apical variant hypertrophic cardiomyopathy ([place])    Atrial fibrillation and flutter ([place])    Diabetes mellitus without complication ([place])    Erythrocytosis 03/02/2015   GERD (gastroesophageal reflux disease)    Headache    Hypertension    Pulmonary embolism ([place]) 2013   Coumadin Therapy   Sleep apnea    CPAP   Symptomatic bradycardia    s/p single-chamber pacemaker (08/2021 - Dr. Quentin Ore)   Past Surgical History:  Procedure Laterality Date   APPENDECTOMY     CARDIOVERSION N/A  07/17/2019   Procedure: CARDIOVERSION;  Surgeon: Minna Merritts, MD;  Location: [facility];  Service: Cardiovascular;  Laterality: N/A;   CATARACT EXTRACTION W/PHACO Left 09/21/2020   Procedure: CATARACT EXTRACTION PHACO AND INTRAOCULAR LENS PLACEMENT ([place]) LEFT 4.94 00:36.2;  Surgeon: Birder Robson, MD;  Location: [place];  Service: Ophthalmology;  Laterality: Left;  sleep apnea   CATARACT EXTRACTION W/PHACO Right 10/05/2020   Procedure: CATARACT EXTRACTION PHACO AND INTRAOCULAR LENS PLACEMENT ([facility]) RIGHT 4.44 00:34.3 ;  Surgeon: Birder Robson, MD;  Location: [place];  Service: Ophthalmology;  Laterality: Right;  Diabetic - diet controlled   COLONOSCOPY WITH PROPOFOL N/A 03/04/2019   Procedure: COLONOSCOPY WITH PROPOFOL;  Surgeon: Toledo, Benay Pike, MD;  Location: [facility];  Service: Gastroenterology;  Laterality: N/A;   PACEMAKER IMPLANT N/A 09/08/2021   Procedure: PACEMAKER IMPLANT;  Surgeon: Vickie Epley, MD;  Location: [place] [facility];  Service: Cardiovascular;  Laterality: N/A;   TONSILLECTOMY     Social History:  reports that she has never smoked. She has never used smokeless tobacco. She reports that she does not drink alcohol and does not use drugs.  No Known Allergies  Family History  Problem Relation Age of Onset   Hypertension Mother    Breast cancer Maternal Aunt        60's   Cancer Maternal Uncle    Stroke Maternal Grandfather     Prior to Admission medications   Medication Sig Start Date End Date Taking?  Authorizing Provider  acetaminophen (TYLENOL) 325 MG tablet Take 1-2 tablets (325-650 mg total) by mouth every 4 (four) hours as needed for mild pain. 09/09/21   Graciella Freer, PA-C  atorvastatin (LIPITOR) 20 MG tablet TAKE 1 TABLET BY MOUTH ONCE  DAILY 01/30/22   End, Cristal Deer, MD  diclofenac Sodium (VOLTAREN) 1 % GEL Apply 1 application topically 2 (two) times daily as needed (pain).    [provider]   latanoprost (XALATAN) 0.005 % ophthalmic solution Place 1 drop into both eyes at bedtime. 02/02/14   [provider]  olmesartan-hydrochlorothiazide (BENICAR HCT) 20-12.5 MG tablet Take 1 tablet by mouth daily. 10/18/20   [provider]  omeprazole (PRILOSEC) 40 MG capsule Take 40 mg by mouth daily.  08/24/15   [provider]  oxybutynin (DITROPAN XL) 15 MG 24 hr tablet Take 15 mg by mouth at bedtime.     [provider]  warfarin (COUMADIN) 2 MG tablet Take 1-1.5 tablets (2-3 mg total) by mouth See admin instructions. 2 mg on Tue and Fri, and 3 mg all other days 09/10/21   Graciella Freer, PA-C    Physical Exam: Vitals:   02/14/22 1417 02/14/22 1422 02/14/22 1430 02/14/22 1540  BP:  (!) 152/87 (!) 153/78 (!) 141/96  Pulse:  66 63 62  Resp:  18 16 18   Temp:  98.2 F (36.8 C)    TempSrc:  Oral    SpO2:  99% 100% 99%  Weight: 70.3 kg     Height: 5\' 4"  (1.626 m)      Examination: General exam: chronically ill appearing, fatigue, malaise HEENT: NCAT, PERRL Respiratory system: CTAB no WRR Cardiovascular system: 2/6 systolic murmur, irregular rhythm, No JVD. Gastrointestinal system: No flank pain, Abdomen soft, NT,ND, BS+. Nervous System: No focal deficits. Extremities: No edema, distal peripheral pulses palpable.  Skin: No rashes, No bruises, No icterus. MSK: Physical Deconditioning  Data Reviewed:  There are no new results to review at this time.  DG Chest Portable 1 View CLINICAL DATA:  Syncope.  EXAM: PORTABLE CHEST 1 VIEW  COMPARISON:  09/09/2021  FINDINGS: Cardiomegaly and LEFT-sided pacemaker again noted.  There is no evidence of focal airspace disease, pulmonary edema, suspicious pulmonary nodule/mass, pleural effusion, or pneumothorax.  No acute bony abnormalities are identified.  IMPRESSION: Cardiomegaly without evidence of acute cardiopulmonary disease.  Electronically Signed   By: M.D.   On:  02/14/2022 15:38 CT Cervical Spine Wo Contrast CLINICAL DATA:  Loss of consciousness with neck trauma.  EXAM: CT HEAD WITHOUT CONTRAST  CT CERVICAL SPINE WITHOUT CONTRAST  TECHNIQUE: Multidetector CT imaging of the head and cervical spine was performed following the standard protocol without intravenous contrast. Multiplanar CT image reconstructions of the cervical spine were also generated.  RADIATION DOSE REDUCTION: This exam was performed according to the departmental dose-optimization program which includes automated exposure control, adjustment of the mA and/or kV according to patient size and/or use of iterative reconstruction technique.  COMPARISON:  None Available.  FINDINGS: CT HEAD FINDINGS  Brain: No evidence of acute infarction, hemorrhage, hydrocephalus, extra-axial collection or mass lesion/mass effect. Generalized parenchymal volume loss with mild associated dilatation of the ventricles, within normal range for patient age.  Vascular: Vascular calcifications at the skull base. No hyperdense vessel or unexpected calcification.  Skull: Normal. Negative for fracture or focal lesion.  Sinuses/Orbits: No acute finding. Complete opacification of a posterior right ethmoid air cell. The other paranasal sinuses and mastoid air cells are  clear.  Other: None.  CT CERVICAL SPINE FINDINGS  Alignment: Trace anterolisthesis of C4 on C5. Alignment is otherwise normal.  Skull base and vertebrae: No acute fracture. No primary bone lesion or focal pathologic process.  Soft tissues and spinal canal: No prevertebral fluid or swelling. No visible canal hematoma.  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 Disc space height is preserved at C2-C3 through C4-C5. Moderate degenerative disc disease at C5-C6 and C6-C7 with loss of disc space height and small anterior osteophytes. Mild-to-moderate bilateral hypertrophic facet arthropathy, most advanced at  the C4-C5 level. Uncovertebral spurring, in combination with hypertrophic facet arthropathy, results in moderate neural foraminal narrowing at the left C5-C6 foramen.  Upper chest: A 1.5 cm hypodense nodule is noted in the lower pole of the right thyroid (series 3, image 73). Visualized lung apices are unremarkable.  Other: None.  IMPRESSION: CT head:  1. No acute intracranial abnormality.  No calvarial fracture. 2. Generalized parenchymal volume loss, within normal range for patient's age.  CT cervical spine:  1. No fracture or traumatic malalignment. 2. Multilevel degenerative changes as described in the body of the report. 3. Incidentally noted 1.5 cm nodule in the lower pole of the right thyroid. Recommend thyroid US on a non-emergent basis. (ref: J Am Coll Radiol. 2015 Feb;12(2): 143-50).  Electronically Signed   By: Sherron Ales M.D.   On: 02/14/2022 15:36 CT Head Wo Contrast CLINICAL DATA:  Loss of consciousness with neck trauma.  EXAM: CT HEAD WITHOUT CONTRAST  CT CERVICAL SPINE WITHOUT CONTRAST  TECHNIQUE: Multidetector CT imaging of the head and cervical spine was performed following the standard protocol without intravenous contrast. Multiplanar CT image reconstructions of the cervical spine were also generated.  RADIATION DOSE REDUCTION: This exam was performed according to the departmental dose-optimization program which includes automated exposure control, adjustment of the mA and/or kV according to patient size and/or use of iterative reconstruction technique.  COMPARISON:  None Available.  FINDINGS: CT HEAD FINDINGS  Brain: No evidence of acute infarction, hemorrhage, hydrocephalus, extra-axial collection or mass lesion/mass effect. Generalized parenchymal volume loss with mild associated dilatation of the ventricles, within normal range for patient age.  Vascular: Vascular calcifications at the skull base. No hyperdense vessel or unexpected  calcification.  Skull: Normal. Negative for fracture or focal lesion.  Sinuses/Orbits: No acute finding. Complete opacification of a posterior right ethmoid air cell. The other paranasal sinuses and mastoid air cells are clear.  Other: None.  CT CERVICAL SPINE FINDINGS  Alignment: Trace anterolisthesis of C4 on C5. Alignment is otherwise normal.  Skull base and vertebrae: No acute fracture. No primary bone lesion or focal pathologic process.  Soft tissues and spinal canal: No prevertebral fluid or swelling. No visible canal hematoma.  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 Disc space height is preserved at C2-C3 through C4-C5. Moderate degenerative disc disease at C5-C6 and C6-C7 with loss of disc space height and small anterior osteophytes. Mild-to-moderate bilateral hypertrophic facet arthropathy, most advanced at the C4-C5 level. Uncovertebral spurring, in combination with hypertrophic facet arthropathy, results in moderate neural foraminal narrowing at the left C5-C6 foramen.  Upper chest: A 1.5 cm hypodense nodule is noted in the lower pole of the right thyroid (series 3, image 73). Visualized lung apices are unremarkable.  Other: None.  IMPRESSION: CT head:  1. No acute intracranial abnormality.  No calvarial fracture. 2. Generalized parenchymal volume loss, within normal range for patient's age.  CT cervical spine:  1.  No fracture or traumatic malalignment. 2. Multilevel degenerative changes as described in the body of the report. 3. Incidentally noted 1.5 cm nodule in the lower pole of the right thyroid. Recommend thyroid US on a non-emergent basis. (ref: J Am Coll Radiol. 2015 Feb;12(2): 143-50).  Electronically Signed   By: Ileana Roup M.D.   On: 02/14/2022 15:36   Lab Results  Component Value Date   WBC 6.0 02/14/2022   HGB 14.9 02/14/2022   HCT 46.1 (H) 02/14/2022   MCV 89.9 02/14/2022   PLT 194 02/14/2022   Lab  Results  Component Value Date   CREATININE 0.96 02/14/2022    Assessment and Plan: Syncope: The event is most likely vasovagal given the presence of prodromal symptoms and it occurring with positional changes, but we will need to rule out other cardiac etiologies including pacemaker syndrome.   - Start gentle fluids and encourage oral hydration. - Interrogate pacemaker. - Monitor on telemetry x 24 hours for any dysrhythmias. - 2022 Echo was reviewed.  Repeat Echo. - Follow up with her regular cardiologist Dr. Harrell Gave End outpatient. - Check TSH and am Cortisol to complete workup.  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 - Evaluate pacemaker to ensure dizzy spells are not related to auto mode switch caused by non-sensed FFRW sensing.  Atrial Fibrillation: - Continue Warfarin for anticoagulation. - INR is 1.9.  Check INR in am.  Essential Hypertension: BP is stable. - Start home ARB at equivalent dosing with Losartan 50 mg daily. - Hold HCTZ to avoid dehydration.  Diabetes Mellitus Type 2: - Lispro Ssi with POC glucose ACHS.  Chronic Kidney Disease Stage 3: - Creatinine is at baseline.  GERD:  - Protonix 20 mg daily.  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  OSA: - CPAP nightly  Erythrocytosis of unknown etiology: Previously received phlebotomies.  HH is stable. 3 mm LLL Nodule: Follow up outpatient. Physical Deconditioning: Consult PT/OT in am.   Advance Care Planning:   Code Status: Full Code   Consults: None  Family Communication: Discussed with family at bedside.  Severity of Illness: The appropriate patient status for this patient is INPATIENT.  Inpatient status is judged to be reasonable and necessary in order to provide the required intensity of service to ensure the patient's safety. The patient's presenting symptoms, physical exam findings, and initial radiographic and laboratory data in the context of their chronic comorbidities is felt to place them at high risk for further clinical deterioration. Furthermore, it is not anticipated that the patient will be medically stable for discharge from the hospital within 2 midnights of admission.   * I certify that at the point of admission it is my clinical judgment that the patient will require inpatient hospital care spanning beyond 2 midnights from the point of admission due to high intensity of service, high risk for further deterioration and high frequency of surveillance required.*  Author: George Hugh, MD 02/14/2022 4:45 PM  For on call review [email or web address].

## 2022-02-14 NOTE — ED Notes (Signed)
Pacemaker interrogated. 

## 2022-02-14 NOTE — ED Provider Notes (Signed)
[facility] Provider Note    Event Date/Time   First MD Initiated Contact with Patient 02/14/22 1502     (approximate)   History   Loss of Consciousness   HPI  Sarah Sweeney is a 81 y.o. female here with loss of consciousness.  The patient states that earlier today, she was in the shower.  She completed her shower.  She got out, was getting ready for the day, when she began to feel mildly lightheaded.  She felt like she was going to pass out.  She went to her chair, sat in it.  While sitting down, she states she began to "see stars" and feel like she was very weak.  She stood up to go to the restroom, then syncopized.  She woke up on the ground.  She fell onto carpeted floor.  Denies any significant pain from the fall.  No chest pain or shortness of breath.  No palpitations.  The patient has a history of A-fib with pacemaker in place.  She does note that she had a similar episode last Sunday, but did not fully pass out.  No other recent changes in health or medications.  No recent illness.  No fevers or chills.     Physical Exam   Triage Vital Signs: ED Triage Vitals  Enc Vitals Group     BP 02/14/22 1422 (!) 152/87     Pulse Rate 02/14/22 1422 66     Resp 02/14/22 1422 18     Temp 02/14/22 1422 98.2 F (36.8 C)     Temp Source 02/14/22 1422 Oral     SpO2 02/14/22 1422 99 %     Weight 02/14/22 1417 155 lb (70.3 kg)     Height 02/14/22 1417 5\' 4"  (1.626 m)     Head Circumference --      Peak Flow --      Pain Score 02/14/22 1416 0     Pain Loc --      Pain Edu? --      Excl. in GC? --     Most recent vital signs: Vitals:   02/14/22 2300 02/14/22 2330  BP: 140/77 (!) 143/76  Pulse: 62 60  Resp: 15 13  Temp:    SpO2: 100% 99%     General: Awake, no distress.  CV:  Good peripheral perfusion.  Regular rate and rhythm.  No murmurs. Resp:  Normal effort.  Lungs clear bilaterally. Abd:  No distention.  No tenderness. Other:  No apparent head  trauma.  Cranial nerves intact.  Strength out of 5 bilateral upper and lower extremity.  Normal sensation light touch.   ED Results / Procedures / Treatments   Labs (all labs ordered are listed, but only abnormal results are displayed) Labs Reviewed  CBC WITH DIFFERENTIAL/PLATELET - Abnormal; Notable for the following components:      Result Value   RBC 5.13 (*)    HCT 46.1 (*)    All other components within normal limits  BASIC METABOLIC PANEL - Abnormal; Notable for the following components:   Glucose, Bld 116 (*)    GFR, Estimated 60 (*)    All other components within normal limits  PROTIME-INR - Abnormal; Notable for the following components:   Prothrombin Time 22.0 (*)    INR 1.9 (*)    All other components within normal limits  BRAIN NATRIURETIC PEPTIDE - Abnormal; Notable for the following components:   B Natriuretic Peptide 271.2 (*)  All other components within normal limits  URINALYSIS, ROUTINE W REFLEX MICROSCOPIC - Abnormal; Notable for the following components:   Color, Urine STRAW (*)    APPearance CLEAR (*)    All other components within normal limits  TROPONIN I (HIGH SENSITIVITY) - Abnormal; Notable for the following components:   Troponin I (High Sensitivity) 26 (*)    All other components within normal limits  TROPONIN I (HIGH SENSITIVITY) - Abnormal; Notable for the following components:   Troponin I (High Sensitivity) 27 (*)    All other components within normal limits  TSH  CK  LACTIC ACID, PLASMA  CORTISOL  CBC  PROTIME-INR     EKG Atrial paced rhythm, ventricular rate 91.  PR 140, QRS 127, QTc 472.  No acute ST elevations or depressions.   RADIOLOGY CT head/C-spine: No acute intracranial abnormality, multilevel degenerative changes but no acute fracture or traumatic malalignment of the cervical spine Chest x-ray:Clear   I also independently reviewed and agree with radiologist interpretations.   PROCEDURES:  Critical Care performed:  No  .1-3 Lead EKG Interpretation  Performed by: Shaune Pollack, MD Authorized by: Shaune Pollack, MD     Interpretation: non-specific     ECG rate:  60-90   ECG rate assessment: normal     Rhythm: paced     Ectopy: none     Conduction: normal   Comments:     Indication: Syncope/weakness     MEDICATIONS ORDERED IN ED: Medications  acetaminophen (TYLENOL) tablet 650 mg (has no administration in time range)    Or  acetaminophen (TYLENOL) suppository 650 mg (has no administration in time range)  ondansetron (ZOFRAN) tablet 4 mg (has no administration in time range)    Or  ondansetron (ZOFRAN) injection 4 mg (has no administration in time range)  senna-docusate (Senokot-S) tablet 1 tablet (has no administration in time range)  atorvastatin (LIPITOR) tablet 20 mg (has no administration in time range)  diclofenac Sodium (VOLTAREN) 1 % topical gel 2 g (has no administration in time range)  pantoprazole (PROTONIX) EC tablet 40 mg (has no administration in time range)  oxybutynin (DITROPAN-XL) 24 hr tablet 15 mg (15 mg Oral Not Given 02/14/22 2302)  losartan (COZAAR) tablet 50 mg (has no administration in time range)  lactated ringers infusion ( Intravenous Infusion Verify 02/14/22 2019)  warfarin (COUMADIN) tablet 2 mg (has no administration in time range)    And  warfarin (COUMADIN) tablet 3 mg (3 mg Oral Given 02/14/22 2023)  Warfarin - Pharmacist Dosing Inpatient (has no administration in time range)     IMPRESSION / MDM / ASSESSMENT AND PLAN / ED COURSE  I reviewed the triage vital signs and the nursing notes.                               The patient is on the cardiac monitor to evaluate for evidence of arrhythmia and/or significant heart rate changes.   Ddx:  Differential includes the following, with pertinent life- or limb-threatening emergencies considered:  Arrhythmia, ACS, orthostasis, PE, CVA, seizure, anemia  Patient's presentation is most consistent with acute  presentation with potential threat to life or bodily function.  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 History suggestive of transient hypotension versus vasovagal versus orthostasis, but no apparent trigger identified. Interrogated [employer] PM and on arrhythmia events noted today. CBC without significant leukocytosis  or anemia. UA negative. Electrolytes normal. Trop elevated at 26 - unknown baseline. CXR clear. CT head/CSpine negative for injury. INR 1.9.   Given pt's syncope, h/o HOCM, and elevated troponin, will admit for obs and further monitoring. Pt updated and in agreement.    MEDICATIONS GIVEN IN ED: Medications  acetaminophen (TYLENOL) tablet 650 mg (has no administration in time range)    Or  acetaminophen (TYLENOL) suppository 650 mg (has no administration in time range)  ondansetron (ZOFRAN) tablet 4 mg (has no administration in time range)    Or  ondansetron (ZOFRAN) injection 4 mg (has no administration in time range)  senna-docusate (Senokot-S) tablet 1 tablet (has no administration in time range)  atorvastatin (LIPITOR) tablet 20 mg (has no administration in time range)  diclofenac Sodium (VOLTAREN) 1 % topical gel 2 g (has no administration in time range)  pantoprazole (PROTONIX) EC tablet 40 mg (has no administration in time range)  oxybutynin (DITROPAN-XL) 24 hr tablet 15 mg (15 mg Oral Not Given 02/14/22 2302)  losartan (COZAAR) tablet 50 mg (has no administration in time range)  lactated ringers infusion ( Intravenous Infusion Verify 02/14/22 2019)  warfarin (COUMADIN) tablet 2 mg (has no administration in time range)    And  warfarin (COUMADIN) tablet 3 mg (3 mg Oral Given 02/14/22 2023)  Warfarin - Pharmacist Dosing Inpatient (has no administration in time range)     Consults:  Hospitalist    EMR reviewed  Reviewed Internal Med notes from Dr. Ulla Gallo, PCP     FINAL CLINICAL IMPRESSION(S) / ED DIAGNOSES    Final diagnoses:  Syncope, unspecified syncope type  Elevated troponin     Rx / DC Orders   ED Discharge Orders     None        Note:  This document was prepared using Dragon voice recognition software and may include unintentional dictation errors.   Shaune Pollack, MD 02/15/22 [phone]

## 2022-02-14 NOTE — Consult Note (Signed)
ANTICOAGULATION CONSULT NOTE - Follow Up Consult  Pharmacy Consult for warfarin mgmt Indication: atrial fibrillation and h/o unprovoke b/l PE  No Known Allergies  Patient Measurements: Height: 5\' 4"  (162.6 cm) Weight: 70.3 kg (155 lb) IBW/kg (Calculated) : 54.7  Vital Signs: Temp: 98.2 F (36.8 C) (07/04 1422) Temp Source: Oral (07/04 1422) BP: 138/96 (07/04 1730) Pulse Rate: 62 (07/04 1730)  Labs: Recent Labs    02/14/22 1419 02/14/22 1722  HGB 14.9  --   HCT 46.1*  --   PLT 194  --   LABPROT 22.0*  --   INR 1.9*  --   CREATININE 0.96  --   CKTOTAL  --  81  TROPONINIHS 26* 27*    Estimated Creatinine Clearance: 44.9 mL/min (by C-G formula based on SCr of 0.96 mg/dL).   Medications:  Per med rec/pt report: Takes 2 mg by mouth on Wednesday and 3 mg all other days last dose 2mg  on  02/13/2022 2330   Assessment: 81 yo Female w/ h/o HTN, CKD3, diaCHF(EF 50-55%), AFib (on longterm VKA), symptomatic bradycardia (s/p PPM 08/2021) , h/o unprovoked b/l PE, OSA (on CPAP nightly) who presented to the ED for experiencing a syncopal episode after completing her shower. Pt had syncope (fall from seated position; headstrike carpeted floor) >> 7/04 - CT Head/Spine no acute abnormalities. Pharmacy consulted for resumption of home VKA.  Goal of Therapy:  INR 2-3 Monitor platelets by anticoagulation protocol: Yes   Plan:  INR 1.9; ordered home regimen (see above). Pt will recieve normal 3mg  dose tonight, but will f/u subsequent INR's in case modification is required. CTM CBC and daily INR  09/2021 02/14/2022,7:15 PM

## 2022-02-14 NOTE — ED Triage Notes (Signed)
[facility] reports pt coming from home for syncopal episode after taking a shower. Pt states she sat down after shower and passed out hitting her head on the carpet. Denies any pain, is on blood thinners.

## 2022-02-14 NOTE — ED Notes (Signed)
Lying: 139/81                 63 Sitting: 141/96                  78 Standing: 131/77                   71

## 2022-02-15 ENCOUNTER — Inpatient Hospital Stay (HOSPITAL_BASED_OUTPATIENT_CLINIC_OR_DEPARTMENT_OTHER)
Admit: 2022-02-15 | Discharge: 2022-02-15 | Disposition: A | Discharge status: Home / Self Care | Payer: Medicare Other | Attending: Internal Medicine | Admitting: Internal Medicine

## 2022-02-15 DIAGNOSIS — R55 Syncope and collapse: Secondary | ICD-10-CM

## 2022-02-15 LAB — ECHOCARDIOGRAM COMPLETE
AR max vel: 1.91 cm2
AV Area VTI: 2.34 cm2
AV Area mean vel: 2.15 cm2
AV Mean grad: 2 mmHg
AV Peak grad: 3.7 mmHg
Ao pk vel: 0.96 m/s
Area-P 1/2: 5.62 cm2
Height: 64 in
S' Lateral: 2.92 cm
Weight: 2480 oz

## 2022-02-15 LAB — CBC
HCT: 45.6 % (ref 36.0–46.0)
Hemoglobin: 14.6 g/dL (ref 12.0–15.0)
MCH: 29 pg (ref 26.0–34.0)
MCHC: 32 g/dL (ref 30.0–36.0)
MCV: 90.5 fL (ref 80.0–100.0)
Platelets: 197 10*3/uL (ref 150–400)
RBC: 5.04 MIL/uL (ref 3.87–5.11)
RDW: 13.3 % (ref 11.5–15.5)
WBC: 6 10*3/uL (ref 4.0–10.5)
nRBC: 0 % (ref 0.0–0.2)

## 2022-02-15 LAB — CORTISOL: Cortisol, Plasma: 7.9 ug/dL

## 2022-02-15 LAB — PROTIME-INR
INR: 2.2 — ABNORMAL HIGH (ref 0.8–1.2)
Prothrombin Time: 24.3 seconds — ABNORMAL HIGH (ref 11.4–15.2)

## 2022-02-15 MED ORDER — PERFLUTREN LIPID MICROSPHERE
1.0000 mL | INTRAVENOUS | Status: AC | PRN
  Administered 2022-02-15: 2 mL via INTRAVENOUS

## 2022-02-15 MED ORDER — OLMESARTAN MEDOXOMIL 40 MG PO TABS: 20.0000 mg | ORAL_TABLET | Freq: Every day | ORAL | 11 refills | Status: DC

## 2022-02-15 NOTE — Progress Notes (Signed)
OT Cancellation Note  Patient Details Name: Sarah Sweeney MRN: [id number] DOB: 03/16/1941   Cancelled Treatment:    Reason Eval/Treat Not Completed: Other (comment) (Pt reports she is indep-MOD I In ADL, reports using EC techniques as needed. Pt reports she feels she is at baseline for ADL status. OT will screen and sign off. Please re-consult if there is a change in functional status.)  Oleta Mouse, OTD OTR/L  02/15/22, 12:25 PM

## 2022-02-15 NOTE — Care Management Obs Status (Signed)
MEDICARE OBSERVATION STATUS NOTIFICATION   Patient Details  Name: Sarah Sweeney MRN: [id number] Date of Birth: 09-Oct-1940   Medicare Observation Status Notification Given:       Truddie Hidden, RN 02/15/2022, 2:38 PM

## 2022-02-15 NOTE — Consult Note (Signed)
ANTICOAGULATION CONSULT NOTE - Follow Up Consult  Pharmacy Consult for warfarin mgmt Indication: atrial fibrillation and h/o unprovoke b/l PE  No Known Allergies  Patient Measurements: Height: 5\' 4"  (162.6 cm) Weight: 70.3 kg (155 lb) IBW/kg (Calculated) : 54.7  Vital Signs: BP: 138/72 (07/05 0700) Pulse Rate: 60 (07/05 0700)  Labs: Recent Labs    02/14/22 1419 02/14/22 1722 02/15/22 0405  HGB 14.9  --  14.6  HCT 46.1*  --  45.6  PLT 194  --  197  LABPROT 22.0*  --  24.3*  INR 1.9*  --  2.2*  CREATININE 0.96  --   --   CKTOTAL  --  81  --   TROPONINIHS 26* 27*  --     Estimated Creatinine Clearance: 44.9 mL/min (by C-G formula based on SCr of 0.96 mg/dL).   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 Pt had syncope (fall from seated position; headstrike carpeted floor) >> 7/04 - CT Head/Spine no acute abnormalities. Pharmacy consulted for resumption of home VKA.  Date INR Warfarin Dose  02/13/22 -- PTA 2 mg  02/14/22 1.9 3 mg  02/15/22 2.2 2 mg   Goal of Therapy:  INR 2-3 Monitor platelets by anticoagulation protocol: Yes   Plan: Home regimen reordered. INR 1.9 > 2.2 (delta of +0.3). Continue with warfarin 2 mg for today, which is typical dose for patient on Wednesdays (see above) Follow up subsequent INR's in case modification is required. CTM CBC and daily INR  04/18/22, PharmD Pharmacy Resident  02/15/2022 7:51 AM

## 2022-02-15 NOTE — Progress Notes (Signed)
*  PRELIMINARY RESULTS* Echocardiogram 2D Echocardiogram has been performed.  Sarah Sweeney 02/15/2022, 10:05 AM

## 2022-02-15 NOTE — ED Notes (Signed)
Patient resting comfortably at this time. NAD noted at this time. Respirations even and unlabored.

## 2022-02-15 NOTE — Evaluation (Signed)
Physical Therapy Evaluation Patient Details Name: Sarah Sweeney MRN: [id number] DOB: September 13, 1940 Today's Date: 02/15/2022  History of Present Illness  81 y.o. 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  She stepped out of the shower and felt light-headed, she sat down and then had syncopal episode after standing up again.  Clinical Impression  Pt did very well with PT exam, showed good safety and confidence with mobility and ambulation.  Pt reports feeling very close to baseline, no lightheadeness, dizziness or safety issues with the effort.  She did have expected HR increase from 70s to 90s with activity and had a ~25 point rise in diastolic BP but O2 remained in the high 90s and she had no DOE safety concerns.  Pt active at baseline, will not need further PT follow up once medically ready for d/c.       Recommendations for follow up therapy are one component of a multi-disciplinary discharge planning process, led by the attending physician.  Recommendations may be updated based on patient status, additional functional criteria and insurance authorization.  Follow Up Recommendations No PT follow up      Assistance Recommended at Discharge None  Patient can return home with the following       Equipment Recommendations None recommended by PT  Recommendations for Other Services       Functional Status Assessment Patient has not had a recent decline in their functional status     Precautions / Restrictions Precautions Precautions: Fall (low risk) Restrictions Weight Bearing Restrictions: No      Mobility  Bed Mobility Overal bed mobility: Independent                   Transfers Overall transfer level: Independent Equipment used: Rolling walker (2 wheels)               General transfer comment: Pt was able to rise to standing w/o hesitation, assist or unsteadiness    Ambulation/Gait Ambulation/Gait assistance: Modified independent (Device/Increase time) Gait Distance (Feet): 150 Feet Assistive device: None         General Gait Details: Pt was able to rise to ambulate with safe and consistent cadence, good confidence and speed and overall effort.  She was able to stop, change directions and do some head turns w/o LOBs.  O2 remains in the high 90s, HR did increase from 70s to 90s with the effort, BP pre ambulation 130s/70s, post ambulation 139/100.  Stairs            Wheelchair Mobility    Modified Rankin (Stroke Patients Only)       Balance Overall balance assessment: Independent                                           Pertinent Vitals/Pain Pain Assessment Pain Assessment: No/denies pain    Home Living Family/patient expects to be discharged to:: Private residence Living Arrangements: Alone Available Help at Discharge: Friend(s) Type of Home: Apartment Home Access: Level entry;Elevator (lives on second floor)         Home Equipment: Agricultural consultant (2 wheels);Cane - single point (does  not use)      Prior Function Prior Level of Function : Independent/Modified Independent             Mobility Comments: Pt reports she drives, runs errands, stays pretty active       Hand Dominance        Extremity/Trunk Assessment   Upper Extremity Assessment Upper Extremity Assessment: Overall WFL for tasks assessed    Lower Extremity Assessment Lower Extremity Assessment: Overall WFL for tasks assessed       Communication   Communication: No difficulties  Cognition Arousal/Alertness: Awake/alert Behavior During Therapy: WFL for tasks assessed/performed Overall Cognitive Status:  Within Functional Limits for tasks assessed                                          General Comments      Exercises     Assessment/Plan    PT Assessment Patient does not need any further PT services  PT Problem List         PT Treatment Interventions      PT Goals (Current goals can be found in the Care Plan section)  Acute Rehab PT Goals Patient Stated Goal: go home PT Goal Formulation: All assessment and education complete, DC therapy    Frequency       Co-evaluation               AM-PAC PT "6 Clicks" Mobility  Outcome Measure Help needed turning from your back to your side while in a flat bed without using bedrails?: None Help needed moving from lying on your back to sitting on the side of a flat bed without using bedrails?: None Help needed moving to and from a bed to a chair (including a wheelchair)?: None Help needed standing up from a chair using your arms (e.g., wheelchair or bedside chair)?: None Help needed to walk in hospital room?: None Help needed climbing 3-5 steps with a railing? : None 6 Click Score: 24    End of Session   Activity Tolerance: Patient tolerated treatment well Patient left: in bed;with call bell/phone within reach Nurse Communication: Mobility status PT Visit Diagnosis: History of falling (Z91.81)    Time: 1015-1030 PT Time Calculation (min) (ACUTE ONLY): 15 min   Charges:   PT Evaluation $PT Eval Low Complexity: 1 Low          Malachi Pro, DPT 02/15/2022, 11:27 AM

## 2022-02-15 NOTE — Care Management CC44 (Signed)
Condition Code 44 Documentation Completed  Patient Details  Name: ISABELLAH SOBOCINSKI MRN: [id number] Date of Birth: 1941-07-06   Condition Code 44 given:  Yes Patient signature on Condition Code 44 notice:  Yes Documentation of 2 MD's agreement:  Yes Code 44 added to claim:  Yes    Truddie Hidden, RN 02/15/2022, 2:41 PM

## 2022-02-15 NOTE — Discharge Summary (Signed)
Physician Discharge Summary  KAMYLAH MANZO IPJ:[id number] DOB: 1940/10/02 DOA: 02/14/2022  PCP: Mick Sell, MD  Admit date: 02/14/2022 Discharge date: 02/15/2022  Admitted From: Home Disposition: Home  Recommendations for Outpatient Follow-up:  Follow up with PCP in 1-2 weeks Cardiology office will schedule follow-up  Home Health: N/A Equipment/Devices: N/A  Discharge Condition: Stable CODE STATUS: Full code Diet recommendation: Low-salt diet  Discharge summary: 81 year old with history of hypertension, CKD stage IIIa, diastolic heart failure with EF 50 to 55%, chronic A-fib on warfarin, recent pacemaker placement for symptomatic bradycardia, history of pulmonary embolism and sleep apnea on CPAP, 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  Most of these episodes happen while she is walking around, standing from sitting position.  In the emergency room she was hemodynamically stable.  No reproducible symptoms.  Negative for orthostatic symptoms.  Therapeutic on warfarin.  Skeletal survey was negative for any acute fractures.  Monitored overnight in the emergency room. Negative for orthostatic symptoms.  Mobilized in the hallway by PT OT. Pacemaker interrogated with no abnormal rhythm. Repeat echocardiogram done, discussed with cardiology.  Cardiology will schedule outpatient follow-up. Decided to discontinue hydrochlorothiazide component of olmesartan to avoid fluid deficit and dehydration. She is also on oxybutynin, will continue now.  If any evidence of orthostatic symptoms, will need to stop oxybutynin. Coumadin levels, creatinine functions remained stable.    Discharge Diagnoses:  Principal Problem:   Syncope    Discharge Instructions  Discharge Instructions     Diet - low sodium heart  healthy   Complete by: As directed    Increase activity slowly   Complete by: As directed       Allergies as of 02/15/2022   No Known Allergies      Medication List     STOP taking these medications    olmesartan-hydrochlorothiazide 20-12.5 MG tablet Commonly known as: BENICAR HCT       TAKE these medications    acetaminophen 325 MG tablet Commonly known as: TYLENOL Take 1-2 tablets (325-650 mg total) by mouth every 4 (four) hours as needed for mild pain.   atorvastatin 20 MG tablet Commonly known as: LIPITOR TAKE 1 TABLET BY MOUTH ONCE  DAILY   diclofenac Sodium 1 % Gel Commonly known as: VOLTAREN Apply 1 application topically 2 (two) times daily as needed (pain).   latanoprost 0.005 % ophthalmic solution Commonly known as: XALATAN Place 1 drop into both eyes at bedtime.   olmesartan 40 MG tablet Commonly known as: BENICAR Take 0.5 tablets (20 mg total) by mouth daily.   omeprazole 40 MG capsule Commonly known as: PRILOSEC Take 40 mg by mouth daily.   oxybutynin 15 MG 24 hr tablet Commonly known as: DITROPAN XL Take 15 mg by mouth at bedtime.   warfarin 2 MG tablet Commonly known as: COUMADIN Take 1-1.5 tablets (2-3 mg total) by mouth See admin instructions. 2 mg on Tue and Fri, and 3 mg all other days What changed: additional instructions   warfarin 3 MG tablet Commonly known as: COUMADIN Take 3 mg by mouth daily. Take 2 mg by mouth on Wednesday and 3 mg all other days What changed: Another medication with the same name was changed. Make sure you understand how and when to take each.        No Known Allergies  Consultations: Cardiology  Procedures/Studies: DG Chest Portable 1 View  Result Date: 02/14/2022 CLINICAL DATA:  Syncope. EXAM: PORTABLE CHEST 1 VIEW COMPARISON:  09/09/2021 FINDINGS: Cardiomegaly and LEFT-sided pacemaker again noted. There is no evidence of focal airspace disease, pulmonary edema, suspicious pulmonary nodule/mass,  pleural effusion, or pneumothorax. No acute bony abnormalities are identified. IMPRESSION: Cardiomegaly without evidence of acute cardiopulmonary disease. Electronically Signed   By: Margarette Canada M.D.   On: 02/14/2022 15:38   CT Head Wo Contrast  Result Date: 02/14/2022 CLINICAL DATA:  Loss of consciousness with neck trauma. EXAM: CT HEAD WITHOUT CONTRAST CT CERVICAL SPINE WITHOUT CONTRAST TECHNIQUE: Multidetector CT imaging of the head and cervical spine was performed following the standard protocol without intravenous contrast. Multiplanar CT image reconstructions of the cervical spine were also generated. RADIATION DOSE REDUCTION: This exam was performed according to the departmental dose-optimization program which includes automated exposure control, adjustment of the mA and/or kV according to patient size and/or use of iterative reconstruction technique. COMPARISON:  None Available. FINDINGS: CT HEAD FINDINGS Brain: No evidence of acute infarction, hemorrhage, hydrocephalus, extra-axial collection or mass lesion/mass effect. Generalized parenchymal volume loss with mild associated dilatation of the ventricles, within normal range for patient age. Vascular: Vascular calcifications at the skull base. No hyperdense vessel or unexpected calcification. Skull: Normal. Negative for fracture or focal lesion. Sinuses/Orbits: No acute finding. Complete opacification of a posterior right ethmoid air cell. The other paranasal sinuses and mastoid air cells are clear. Other: None. CT CERVICAL SPINE FINDINGS Alignment: Trace anterolisthesis of C4 on C5. Alignment is otherwise normal. Skull base and vertebrae: No acute fracture. No primary bone lesion or focal pathologic process. Soft tissues and spinal canal: No prevertebral fluid or swelling. No visible canal hematoma. 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 Disc space height is preserved at C2-C3 through C4-C5. Moderate degenerative disc disease at  C5-C6 and C6-C7 with loss of disc space height and small anterior osteophytes. Mild-to-moderate bilateral hypertrophic facet arthropathy, most advanced at the C4-C5 level. Uncovertebral spurring, in combination with hypertrophic facet arthropathy, results in moderate neural foraminal narrowing at the left C5-C6 foramen. Upper chest: A 1.5 cm hypodense nodule is noted in the lower pole of the right thyroid (series 3, image 73). Visualized lung apices are unremarkable. Other: None. IMPRESSION: CT head: 1. No acute intracranial abnormality.  No calvarial fracture. 2. Generalized parenchymal volume loss, within normal range for patient's age. CT cervical spine: 1. No fracture or traumatic malalignment. 2. Multilevel degenerative changes as described in the body of the report. 3. Incidentally noted 1.5 cm nodule in the lower pole of the right thyroid. Recommend thyroid US on a non-emergent basis. (ref: J Am Coll Radiol. 2015 Feb;12(2): 143-50). Electronically Signed   By: Ileana Roup M.D.   On: 02/14/2022 15:36   CT Cervical Spine Wo Contrast  Result Date: 02/14/2022 CLINICAL DATA:  Loss of consciousness with neck trauma. EXAM: CT HEAD WITHOUT CONTRAST CT CERVICAL SPINE WITHOUT CONTRAST TECHNIQUE: Multidetector CT imaging of the head and cervical spine was performed following the standard protocol without intravenous contrast. Multiplanar CT image reconstructions of the cervical spine were also generated. RADIATION DOSE REDUCTION: This exam was performed according to the departmental dose-optimization program which includes automated exposure control, adjustment of the mA and/or kV according to patient size and/or use of iterative reconstruction technique. COMPARISON:  None Available. FINDINGS: CT HEAD FINDINGS Brain: No evidence of acute infarction, hemorrhage, hydrocephalus, extra-axial collection or mass lesion/mass effect. Generalized parenchymal volume loss with mild  associated dilatation of the ventricles,  within normal range for patient age. Vascular: Vascular calcifications at the skull base. No hyperdense vessel or unexpected calcification. Skull: Normal. Negative for fracture or focal lesion. Sinuses/Orbits: No acute finding. Complete opacification of a posterior right ethmoid air cell. The other paranasal sinuses and mastoid air cells are clear. Other: None. CT CERVICAL SPINE FINDINGS Alignment: Trace anterolisthesis of C4 on C5. Alignment is otherwise normal. Skull base and vertebrae: No acute fracture. No primary bone lesion or focal pathologic process. Soft tissues and spinal canal: No prevertebral fluid or swelling. No visible canal hematoma. 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 Disc space height is preserved at C2-C3 through C4-C5. Moderate degenerative disc disease at C5-C6 and C6-C7 with loss of disc space height and small anterior osteophytes. Mild-to-moderate bilateral hypertrophic facet arthropathy, most advanced at the C4-C5 level. Uncovertebral spurring, in combination with hypertrophic facet arthropathy, results in moderate neural foraminal narrowing at the left C5-C6 foramen. Upper chest: A 1.5 cm hypodense nodule is noted in the lower pole of the right thyroid (series 3, image 73). Visualized lung apices are unremarkable. Other: None. IMPRESSION: CT head: 1. No acute intracranial abnormality.  No calvarial fracture. 2. Generalized parenchymal volume loss, within normal range for patient's age. CT cervical spine: 1. No fracture or traumatic malalignment. 2. Multilevel degenerative changes as described in the body of the report. 3. Incidentally noted 1.5 cm nodule in the lower pole of the right thyroid. Recommend thyroid US on a non-emergent basis. (ref: J Am Coll Radiol. 2015 Feb;12(2): 143-50). Electronically Signed   By: Ileana Roup M.D.   On: 02/14/2022 15:36   (Echo, Carotid, EGD, Colonoscopy, ERCP)    Subjective: Patient seen and examined in the emergency room.   Denies any complaints.  Patient tells me that she has been having the symptoms for long time and mostly they come after walking around or standing for a while.  She never had any injury.  She is careful about going back to sitting posture when she has any symptoms.   Discharge Exam: Vitals:   02/15/22 0730 02/15/22 1300  BP: 133/76 139/82  Pulse: (!) 58 84  Resp: 19 18  Temp:    SpO2: 97% 100%   Vitals:   02/15/22 0500 02/15/22 0700 02/15/22 0730 02/15/22 1300  BP: 140/68 138/72 133/76 139/82  Pulse: (!) 59 60 (!) 58 84  Resp: 16 15 19 18   Temp:      TempSrc:      SpO2: 98% 98% 97% 100%  Weight:      Height:        General: Pt is alert, awake, not in acute distress Cardiovascular: RRR, S1/S2 +, no rubs, no gallops, pacemaker in place. Respiratory: CTA bilaterally, no wheezing, no rhonchi Abdominal: Soft, NT, ND, bowel sounds + Extremities: no edema, no cyanosis    The results of significant diagnostics from this hospitalization (including imaging, microbiology, ancillary and laboratory) are listed below for reference.     Microbiology: No results found for this or any previous visit (from the past 240 hour(s)).   Labs: BNP (last 3 results) Recent Labs    02/14/22 1419  BNP 123456*   Basic Metabolic Panel: Recent Labs  Lab 02/14/22 1419  NA 137  K 4.9  CL 101  CO2 29  GLUCOSE 116*  BUN 17  CREATININE 0.96  CALCIUM 9.6   Liver Function Tests: No results for input(s): "AST", "ALT", "ALKPHOS", "BILITOT", "PROT", "ALBUMIN" in the  last 168 hours. No results for input(s): "LIPASE", "AMYLASE" in the last 168 hours. No results for input(s): "AMMONIA" in the last 168 hours. CBC: Recent Labs  Lab 02/14/22 1419 02/15/22 0405  WBC 6.0 6.0  NEUTROABS 3.8  --   HGB 14.9 14.6  HCT 46.1* 45.6  MCV 89.9 90.5  PLT 194 197   Cardiac Enzymes: Recent Labs  Lab 02/14/22 1722  CKTOTAL 81   BNP: Invalid input(s): "POCBNP" CBG: No results for input(s): "GLUCAP"  in the last 168 hours. D-Dimer No results for input(s): "DDIMER" in the last 72 hours. Hgb A1c No results for input(s): "HGBA1C" in the last 72 hours. Lipid Profile No results for input(s): "CHOL", "HDL", "LDLCALC", "TRIG", "CHOLHDL", "LDLDIRECT" in the last 72 hours. Thyroid function studies Recent Labs    02/14/22 1722  TSH 2.936   Anemia work up No results for input(s): "VITAMINB12", "FOLATE", "FERRITIN", "TIBC", "IRON", "RETICCTPCT" in the last 72 hours. Urinalysis    Component Value Date/Time   COLORURINE STRAW (A) 02/14/2022 1544   APPEARANCEUR CLEAR (A) 02/14/2022 1544   LABSPEC 1.005 02/14/2022 1544   PHURINE 6.0 02/14/2022 1544   GLUCOSEU NEGATIVE 02/14/2022 1544   HGBUR NEGATIVE 02/14/2022 1544   BILIRUBINUR NEGATIVE 02/14/2022 1544   KETONESUR NEGATIVE 02/14/2022 1544   PROTEINUR NEGATIVE 02/14/2022 1544   NITRITE NEGATIVE 02/14/2022 1544   LEUKOCYTESUR NEGATIVE 02/14/2022 1544   Sepsis Labs Recent Labs  Lab 02/14/22 1419 02/15/22 0405  WBC 6.0 6.0   Microbiology No results found for this or any previous visit (from the past 240 hour(s)).   Time coordinating discharge: 32 minutes  SIGNED:   Dorcas Carrow, MD  [facility] 02/15/2022, 2:54 PM

## 2022-02-15 NOTE — Care Management CC44 (Signed)
Condition Code 44 Documentation Completed  Patient Details  Name: Sarah Sweeney MRN: [id number] Date of Birth: [birth year]   Condition Code 44 given:  Yes Patient signature on Condition Code 44 notice:  Yes Documentation of 2 MD's agreement:  Yes Code 44 added to claim:  Yes    Truddie Hidden, RN 02/15/2022, 2:38 PM

## 2022-03-10 ENCOUNTER — Ambulatory Visit (INDEPENDENT_AMBULATORY_CARE_PROVIDER_SITE_OTHER): Payer: Medicare Other

## 2022-03-10 DIAGNOSIS — I422 Other hypertrophic cardiomyopathy: Secondary | ICD-10-CM

## 2022-03-10 LAB — CUP PACEART REMOTE DEVICE CHECK
Battery Remaining Longevity: 148 mo
Battery Voltage: 3.1 V
Brady Statistic RV Percent Paced: 99.82 %
Date Time Interrogation Session: 20230727185223
Implantable Lead Implant Date: 20230125
Implantable Lead Location: 753860
Implantable Lead Model: 3830
Implantable Pulse Generator Implant Date: 20230125
Lead Channel Impedance Value: 361 Ohm
Lead Channel Impedance Value: 513 Ohm
Lead Channel Pacing Threshold Amplitude: 0.5 V
Lead Channel Pacing Threshold Pulse Width: 0.4 ms
Lead Channel Sensing Intrinsic Amplitude: 26.375 mV
Lead Channel Sensing Intrinsic Amplitude: 26.375 mV
Lead Channel Setting Pacing Amplitude: 2 V
Lead Channel Setting Pacing Pulse Width: 0.4 ms
Lead Channel Setting Sensing Sensitivity: 1.2 mV

## 2022-03-29 NOTE — Progress Notes (Signed)
Remote pacemaker transmission.   

## 2022-04-02 ENCOUNTER — Other Ambulatory Visit: Payer: Self-pay | Admitting: Internal Medicine

## 2022-04-28 ENCOUNTER — Telehealth: Payer: Self-pay | Admitting: Internal Medicine

## 2022-04-28 NOTE — Telephone Encounter (Signed)
 *  STAT* If patient is at the pharmacy, call can be transferred to refill team.   1. Which medications need to be refilled? (please list name of each medication and dose if known) atorvastatin (LIPITOR) 20 MG tablet  2. Which pharmacy/location (including street and city if local pharmacy) is medication to be sent to? [facility] - [place], [place] - [address]  3. Do they need a 30 day or 90 day supply? 90 days

## 2022-05-01 MED ORDER — ATORVASTATIN CALCIUM 20 MG PO TABS: 20.0000 mg | ORAL_TABLET | Freq: Every day | ORAL | 0 refills | Status: DC

## 2022-05-01 NOTE — Telephone Encounter (Signed)
Requested Prescriptions   Signed Prescriptions Disp Refills   atorvastatin (LIPITOR) 20 MG tablet 90 tablet 0    Sig: Take 1 tablet (20 mg total) by mouth daily.    Authorizing Provider: END, CHRISTOPHER    Ordering User: Britt Bottom

## 2022-05-11 ENCOUNTER — Ambulatory Visit: Payer: Medicare Other | Attending: Internal Medicine | Admitting: Internal Medicine

## 2022-05-11 ENCOUNTER — Encounter: Payer: Self-pay | Admitting: Internal Medicine

## 2022-05-11 VITALS — BP 148/80 | HR 71 | Ht 64.5 in | Wt 159.0 lb

## 2022-05-11 DIAGNOSIS — I422 Other hypertrophic cardiomyopathy: Secondary | ICD-10-CM | POA: Diagnosis not present

## 2022-05-11 DIAGNOSIS — I4819 Other persistent atrial fibrillation: Secondary | ICD-10-CM

## 2022-05-11 DIAGNOSIS — I10 Essential (primary) hypertension: Secondary | ICD-10-CM | POA: Diagnosis not present

## 2022-05-11 DIAGNOSIS — R55 Syncope and collapse: Secondary | ICD-10-CM | POA: Diagnosis not present

## 2022-05-11 DIAGNOSIS — E785 Hyperlipidemia, unspecified: Secondary | ICD-10-CM

## 2022-05-11 DIAGNOSIS — E1169 Type 2 diabetes mellitus with other specified complication: Secondary | ICD-10-CM

## 2022-05-11 MED ORDER — OLMESARTAN MEDOXOMIL 40 MG PO TABS: 40.0000 mg | ORAL_TABLET | Freq: Every day | ORAL | 11 refills | Status: DC

## 2022-05-11 NOTE — Progress Notes (Signed)
Follow-up Outpatient Visit Date: 05/11/2022  Primary Care Provider: Mick Sell, MD [address] [place] [address]  Chief Complaint: Follow-up atrial fibrillation and hypertrophic cardiomyopathy  HPI:  Sarah Sweeney is a 81 y.o. 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  I last saw her in March, at which time she was feeling noticeably better following pacemaker implantation in January.  She saw Dr. Lalla Brothers in follow-up in May, at which time she was doing well.  No medication changes were made.  She was hospitalized at [facility] in July following a syncopal episode.  Work-up in the ED was unremarkable.  HCTZ was discontinued due to concern for dehydration leading to orthostatic hypotension and syncope.  Today, Ms. Moulin reports that she has been feeling well without further lightheadedness or syncope.  She also denies chest pain, shortness of breath, palpitations, and edema.  Most recent pacemaker interrogation in late July noted only a singe 8-beat run of NSVT.  Device was functioning normally.  --------------------------------------------------------------------------------------------------  Past Medical History:  Diagnosis Date   Allergy    Apical variant hypertrophic cardiomyopathy (HCC)    Atrial fibrillation and flutter (HCC)    Diabetes mellitus without complication (HCC)    Erythrocytosis 03/02/2015   GERD (gastroesophageal reflux disease)    Headache    Hypertension    Pulmonary embolism (HCC) 2013   Coumadin Therapy   Sleep apnea    CPAP   Symptomatic bradycardia    s/p single-chamber pacemaker (08/2021 - Dr. Lalla Brothers)   Past  Surgical History:  Procedure Laterality Date   APPENDECTOMY     CARDIOVERSION N/A 07/17/2019   Procedure: CARDIOVERSION;  Surgeon: Antonieta Iba, MD;  Location: [facility] ORS;  Service: Cardiovascular;  Laterality: N/A;   CATARACT EXTRACTION W/PHACO Left 09/21/2020   Procedure: CATARACT EXTRACTION PHACO AND INTRAOCULAR LENS PLACEMENT (IOC) LEFT 4.94 00:36.2;  Surgeon: Galen Manila, MD;  Location: [facility];  Service: Ophthalmology;  Laterality: Left;  sleep apnea   CATARACT EXTRACTION W/PHACO Right 10/05/2020   Procedure: CATARACT EXTRACTION PHACO AND INTRAOCULAR LENS PLACEMENT (IOC) RIGHT 4.44 00:34.3 ;  Surgeon: Galen Manila, MD;  Location: [facility];  Service: Ophthalmology;  Laterality: Right;  Diabetic - diet controlled   COLONOSCOPY WITH PROPOFOL N/A 03/04/2019   Procedure: COLONOSCOPY WITH PROPOFOL;  Surgeon: Toledo, Boykin Nearing, MD;  Location: [facility];  Service: Gastroenterology;  Laterality: N/A;   PACEMAKER IMPLANT N/A 09/08/2021   Procedure: PACEMAKER IMPLANT;  Surgeon: Lanier Prude, MD;  Location: [facility] INVASIVE CV LAB;  Service: Cardiovascular;  Laterality: N/A;   TONSILLECTOMY      Current Meds  Medication Sig   acetaminophen (TYLENOL) 325 MG tablet Take 1-2 tablets (325-650 mg total) by mouth every 4 (four) hours as needed for mild pain.   atorvastatin (LIPITOR) 20 MG tablet Take 1 tablet (20 mg total) by mouth daily.   diclofenac Sodium (VOLTAREN) 1 % GEL Apply 1 application topically 2 (two) times daily as needed (pain).   latanoprost (XALATAN) 0.005 % ophthalmic solution Place 1 drop into both eyes at bedtime.   olmesartan (BENICAR) 40 MG tablet Take 0.5 tablets (20 mg total) by mouth daily.   omeprazole (PRILOSEC) 40 MG capsule Take 40 mg by mouth daily.    oxybutynin (  DITROPAN XL) 15 MG 24 hr tablet Take 15 mg by mouth at bedtime.    warfarin (COUMADIN) 2 MG tablet Take 1-1.5 tablets (2-3 mg total) by mouth See admin instructions. 2 mg on  Tue and Fri, and 3 mg all other days (Patient taking differently: Take 2-3 mg by mouth See admin instructions. Take 2 mg by mouth on Wednesday, and 3 mg all other days)   warfarin (COUMADIN) 3 MG tablet Take 3 mg by mouth daily. Take 2 mg by mouth on Wednesday and 3 mg all other days    Allergies: Patient has no known allergies.  Social History   Tobacco Use   Smoking status: Never   Smokeless tobacco: Never  Vaping Use   Vaping Use: Never used  Substance Use Topics   Alcohol use: No   Drug use: No    Family History  Problem Relation Age of Onset   Hypertension Mother    Breast cancer Maternal Aunt        60's   Cancer Maternal Uncle    Stroke Maternal Grandfather     Review of Systems: A 12-system review of systems was performed and was negative except as noted in the HPI.  --------------------------------------------------------------------------------------------------  Physical Exam: BP (!) 140/80 (BP Location: Left Arm, Patient Position: Sitting, Cuff Size: Normal)   Pulse 71   Ht 5' 4.5" (1.638 m)   Wt 159 lb (72.1 kg)   SpO2 96%   BMI 26.87 kg/m  Repeat BP: 148/80  General:  NAD. Neck: No JVD or HJR. Lungs: Clear to auscultation bilaterally without wheezes or crackles. Heart: Regular rate and rhythm with 1/6 systolic murmur. Abdomen: Soft, nontender, nondistended. Extremities: No lower extremity edema.  EKG:  Ventricular paced rhythm with underlying atrial fibrillation.  No significant change from prior tracing in our office on 12/14/2021.  Lab Results  Component Value Date   WBC 6.0 02/15/2022   HGB 14.6 02/15/2022   HCT 45.6 02/15/2022   MCV 90.5 02/15/2022   PLT 197 02/15/2022    Lab Results  Component Value Date   NA 137 02/14/2022   K 4.9 02/14/2022   CL 101 02/14/2022   CO2 29 02/14/2022   BUN 17 02/14/2022   CREATININE 0.96 02/14/2022   GLUCOSE 116 (H) 02/14/2022   ALT 18 12/12/2017     --------------------------------------------------------------------------------------------------  ASSESSMENT AND PLAN: Permanent atrial fibrillation: Patient is ventricularly paced with underlying atrial fibrillation.  We will continue warfarin.  Apical hypertrophic cardiomyopathy and syncope: Syncopal episode noted in early July with no significant arrhythmia identified on subsequent device interrogation.  Episode was attributed to dehydration in the setting of HCTZ use.  Defer further workup at this time.  Could consider addition of beta-blocker in the future.  Hypertension: BP suboptimally controlled.  We will increase olmesartan to 40 mg daily with BMP in ~2 weeks.  Hyperlipidemia associated with type 2 diabetes mellitus: No recent lipid panel noted.  We will check a fasting lipid panel when Ms. Hulick returns for her BMP in ~2 weeks.  Continue atorvastatin for now.  Ongoing management of DM per Dr. Ola Spurr.  Follow-up: Return to clinic in 3 months.  Nelva Bush, MD 05/11/2022 4:10 PM

## 2022-05-11 NOTE — Patient Instructions (Signed)
Medication Instructions:   INCREASE Olmesartan - take one tablet (40mg ) by mouth daily.   *If you need a refill on your cardiac medications before your next appointment, please call your pharmacy*   Lab Work:  Your physician recommends that you return for lab work in: 2 weeks at the medical mall. You will need to be fasting. No appt is needed. Hours are M-F 7AM- 6 PM.  If you have labs (blood work) drawn today and your tests are completely normal, you will receive your results only by: [place] (if you have MyChart) OR A paper copy in the mail If you have any lab test that is abnormal or we need to change your treatment, we will call you to review the results.   Testing/Procedures:  None Ordered   Follow-Up: At [facility], you and your health needs are our priority.  As part of our continuing mission to provide you with exceptional heart care, we have created designated Provider Care Teams.  These Care Teams include your primary Cardiologist (physician) and Advanced Practice Providers (APPs -  Physician Assistants and Nurse Practitioners) who all work together to provide you with the care you need, when you need it.  We recommend signing up for the patient portal called "MyChart".  Sign up information is provided on this After Visit Summary.  MyChart is used to connect with patients for Virtual Visits (Telemedicine).  Patients are able to view lab/test results, encounter notes, upcoming appointments, etc.  Non-urgent messages can be sent to your provider as well.   To learn more about what you can do with MyChart, go to [email or web address].    Your next appointment:   3 month(s)  The format for your next appointment:   In Person  Provider:   You may see Nelva Bush, MD or one of the following Advanced Practice Providers on your designated Care Team:   Murray Hodgkins, NP Christell Faith, PA-C Cadence Kathlen Mody, PA-C Gerrie Nordmann, NP     Important  Information About Sugar

## 2022-05-12 ENCOUNTER — Encounter: Payer: Self-pay | Admitting: Internal Medicine

## 2022-05-24 ENCOUNTER — Other Ambulatory Visit
Admission: RE | Admit: 2022-05-24 | Discharge: 2022-05-24 | Disposition: A | Discharge status: Home / Self Care | Payer: Medicare Other | Attending: Internal Medicine | Admitting: Internal Medicine

## 2022-05-24 DIAGNOSIS — I4819 Other persistent atrial fibrillation: Secondary | ICD-10-CM | POA: Diagnosis present

## 2022-05-24 DIAGNOSIS — E785 Hyperlipidemia, unspecified: Secondary | ICD-10-CM | POA: Diagnosis present

## 2022-05-24 DIAGNOSIS — E1169 Type 2 diabetes mellitus with other specified complication: Secondary | ICD-10-CM | POA: Diagnosis present

## 2022-05-24 LAB — LIPID PANEL
Cholesterol: 141 mg/dL (ref 0–200)
HDL: 63 mg/dL (ref >40)
LDL Cholesterol: 65 mg/dL (ref 0–99)
Total CHOL/HDL Ratio: 2.2 RATIO
Triglycerides: 67 mg/dL (ref <150)
VLDL: 13 mg/dL (ref 0–40)

## 2022-05-24 LAB — COMPREHENSIVE METABOLIC PANEL
ALT: 21 U/L (ref 0–44)
AST: 22 U/L (ref 15–41)
Albumin: 3.7 g/dL (ref 3.5–5.0)
Alkaline Phosphatase: 67 U/L (ref 38–126)
Anion gap: 8 (ref 5–15)
BUN: 18 mg/dL (ref 8–23)
CO2: 28 mmol/L (ref 22–32)
Calcium: 9 mg/dL (ref 8.9–10.3)
Chloride: 107 mmol/L (ref 98–111)
Creatinine, Ser: 0.96 mg/dL (ref 0.44–1.00)
GFR, Estimated: 60 mL/min — ABNORMAL LOW (ref >60)
Glucose, Bld: 125 mg/dL — ABNORMAL HIGH (ref 70–99)
Potassium: 4 mmol/L (ref 3.5–5.1)
Sodium: 143 mmol/L (ref 135–145)
Total Bilirubin: 1.2 mg/dL (ref 0.3–1.2)
Total Protein: 6.6 g/dL (ref 6.5–8.1)

## 2022-06-09 ENCOUNTER — Ambulatory Visit (INDEPENDENT_AMBULATORY_CARE_PROVIDER_SITE_OTHER): Payer: Medicare Other

## 2022-06-09 DIAGNOSIS — I422 Other hypertrophic cardiomyopathy: Secondary | ICD-10-CM

## 2022-06-09 LAB — CUP PACEART REMOTE DEVICE CHECK
Battery Remaining Longevity: 146 mo
Battery Voltage: 3.05 V
Brady Statistic RV Percent Paced: 99.69 %
Date Time Interrogation Session: 20231026190609
Implantable Lead Connection Status: 753985
Implantable Lead Implant Date: 20230125
Implantable Lead Location: 753860
Implantable Lead Model: 3830
Implantable Pulse Generator Implant Date: 20230125
Lead Channel Impedance Value: 380 Ohm
Lead Channel Impedance Value: 513 Ohm
Lead Channel Pacing Threshold Amplitude: 0.5 V
Lead Channel Pacing Threshold Pulse Width: 0.4 ms
Lead Channel Sensing Intrinsic Amplitude: 23.625 mV
Lead Channel Sensing Intrinsic Amplitude: 23.625 mV
Lead Channel Setting Pacing Amplitude: 2 V
Lead Channel Setting Pacing Pulse Width: 0.4 ms
Lead Channel Setting Sensing Sensitivity: 1.2 mV
Zone Setting Status: 755011

## 2022-06-12 ENCOUNTER — Encounter (INDEPENDENT_AMBULATORY_CARE_PROVIDER_SITE_OTHER): Payer: Self-pay

## 2022-06-15 NOTE — Progress Notes (Signed)
Remote pacemaker transmission.   

## 2022-07-28 ENCOUNTER — Other Ambulatory Visit: Payer: Self-pay | Admitting: Internal Medicine

## 2022-08-15 ENCOUNTER — Ambulatory Visit: Payer: Medicare Other | Admitting: Cardiology

## 2022-08-15 ENCOUNTER — Other Ambulatory Visit: Payer: Self-pay | Admitting: Infectious Diseases

## 2022-08-15 ENCOUNTER — Ambulatory Visit
Admission: RE | Admit: 2022-08-15 | Discharge: 2022-08-15 | Disposition: A | Discharge status: Home / Self Care | Payer: Medicare Other | Source: Ambulatory Visit | Attending: Infectious Diseases | Admitting: Infectious Diseases

## 2022-08-15 DIAGNOSIS — Z7901 Long term (current) use of anticoagulants: Secondary | ICD-10-CM | POA: Insufficient documentation

## 2022-08-15 DIAGNOSIS — G44319 Acute post-traumatic headache, not intractable: Secondary | ICD-10-CM | POA: Insufficient documentation

## 2022-08-15 DIAGNOSIS — W1800XA Striking against unspecified object with subsequent fall, initial encounter: Secondary | ICD-10-CM

## 2022-08-17 ENCOUNTER — Encounter: Payer: Self-pay | Admitting: Internal Medicine

## 2022-08-17 ENCOUNTER — Ambulatory Visit: Payer: Medicare Other | Attending: Cardiology | Admitting: Internal Medicine

## 2022-08-17 VITALS — BP 150/80 | HR 76 | Ht 64.0 in | Wt 161.0 lb

## 2022-08-17 DIAGNOSIS — I4821 Permanent atrial fibrillation: Secondary | ICD-10-CM | POA: Diagnosis not present

## 2022-08-17 DIAGNOSIS — I10 Essential (primary) hypertension: Secondary | ICD-10-CM

## 2022-08-17 DIAGNOSIS — E1169 Type 2 diabetes mellitus with other specified complication: Secondary | ICD-10-CM

## 2022-08-17 DIAGNOSIS — Z950 Presence of cardiac pacemaker: Secondary | ICD-10-CM | POA: Diagnosis not present

## 2022-08-17 DIAGNOSIS — I422 Other hypertrophic cardiomyopathy: Secondary | ICD-10-CM

## 2022-08-17 DIAGNOSIS — E785 Hyperlipidemia, unspecified: Secondary | ICD-10-CM

## 2022-08-17 MED ORDER — AMLODIPINE BESYLATE 2.5 MG PO TABS: 2.5000 mg | ORAL_TABLET | Freq: Every day | ORAL | 3 refills | Status: DC

## 2022-08-17 NOTE — Progress Notes (Signed)
Follow-up Outpatient Visit Date: 08/17/2022  Primary Care Provider: Leonel Ramsay, MD [place] [id number]  Chief Complaint: Follow-up a-fib and apical HCM  HPI:  Sarah Sweeney is a 82 y.o. 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  I last saw her in September, at which time she was feeling well without further episodes of syncope/near syncope.  Due to suboptimal blood pressure control, we agreed to increase olmesartan to 40 mg daily.  Today, Sarah Sweeney reports that she has been doing fairly well, denying chest pain, shortness of breath, palpitations, and lightheadedness.  She had a mechanical [place] the airport[place]  She was bending over to pick up her coat and lost her balance, striking her head on the corner of her suitcase.  She has not had any further falls.  She suffered some abrasions that continue to heal.  Head CT ordered by Dr. Ola Spurr performed 2 days ago did not show any acute abnormalities.  Sarah Sweeney is tolerating increased dose of olmesartan well.  Home blood pressures are typically in the 962-229 mmHg systolic range.  --------------------------------------------------------------------------------------------------  Past Medical History:  Diagnosis Date   Allergy    Apical variant hypertrophic cardiomyopathy ([place])    Atrial fibrillation and flutter ([place])    Diabetes mellitus without complication ([place])    Erythrocytosis 03/02/2015   GERD (gastroesophageal reflux disease)    Headache    Hypertension    Pulmonary embolism ([place]) 2013   Coumadin Therapy   Sleep apnea    CPAP   Symptomatic  bradycardia    s/p single-chamber pacemaker (08/2021 - Dr. Quentin Ore)   Past Surgical History:  Procedure Laterality Date   APPENDECTOMY     CARDIOVERSION N/A 07/17/2019   Procedure: CARDIOVERSION;  Surgeon: Minna Merritts, MD;  Location: [facility] OR[facility];  Service: Cardiovascular;  Laterality: N/A;   CATARACT EXTRACTION W/PHACO Left 09/21/2020   Procedure: CATARACT EXTRACTION PHACO AND INTRAOCULAR LENS PLACEMENT ([place]) LEFT 4.94 00:36.2;  Surgeon: Birder Robson, MD;  Location: [place];  Service: Ophthalmology;  Laterality: Left;  sleep apnea   CATARACT EXTRACTION W/PHACO Right 10/05/2020   Procedure: CATARACT EXTRACTION PHACO AND INTRAOCULAR LENS PLACEMENT (IOC) RIGHT 4.44 00:34.3 ;  Surgeon: Birder Robson, MD;  Location: [place];  Service: Ophthalmology;  Laterality: Right;  Diabetic - diet controlled   COLONOSCOPY WITH PROPOFOL N/A 03/04/2019   Procedure: COLONOSCOPY WITH PROPOFOL;  Surgeon: Toledo, Benay Pike, MD;  Location: [facility];  Service: Gastroenterology;  Laterality: N/A;   PACEMAKER IMPLANT N/A 09/08/2021   Procedure: PACEMAKER IMPLANT;  Surgeon: Vickie Epley, MD;  Location: [place] [facility];  Service: Cardiovascular;  Laterality: N/A;   TONSILLECTOMY      Current Meds  Medication Sig   acetaminophen (TYLENOL) 325 MG tablet Take 1-2 tablets (325-650 mg total) by mouth every 4 (four) hours as needed for mild pain.   atorvastatin (LIPITOR) 20 MG tablet TAKE 1 TABLET BY MOUTH DAILY   diclofenac Sodium (VOLTAREN) 1 % GEL Apply 1 application topically 2 (two) times daily as needed (pain).   latanoprost (XALATAN) 0.005 % ophthalmic solution Place 1 drop into both eyes at bedtime.   olmesartan (BENICAR) 40 MG tablet Take 1 tablet (40 mg total) by  mouth daily.   omeprazole (PRILOSEC) 40 MG capsule Take 40 mg by mouth daily.    oxybutynin (DITROPAN XL) 15 MG 24 hr tablet Take 15 mg by mouth at bedtime.    warfarin (COUMADIN) 2 MG tablet Take 1-1.5 tablets  (2-3 mg total) by mouth See admin instructions. 2 mg on Tue and Fri, and 3 mg all other days (Patient taking differently: Take 2-3 mg by mouth See admin instructions. Take 2 mg by mouth on Wednesday, and 3 mg all other days)   warfarin (COUMADIN) 3 MG tablet Take 3 mg by mouth daily. Take 2 mg by mouth on Wednesday and 3 mg all other days    Allergies: Patient has no known allergies.  Social History   Tobacco Use   Smoking status: Never   Smokeless tobacco: Never  Vaping Use   Vaping Use: Never used  Substance Use Topics   Alcohol use: No   Drug use: No    Family History  Problem Relation Age of Onset   Hypertension Mother    Breast cancer Maternal Aunt        60's   Cancer Maternal Uncle    Stroke Maternal Grandfather     Review of Systems: A 12-system review of systems was performed and was negative except as noted in the HPI.  --------------------------------------------------------------------------------------------------  Physical Exam: BP (!) 150/80 (BP Location: Left Arm)   Pulse 76   Ht 5\' 4"  (1.626 m)   Wt 161 lb (73 kg)   SpO2 96%   BMI 27.64 kg/m  Repeat BP: 150/80  General:  NAD. Neck: No JVD or HJR. Lungs: Clear to auscultation bilaterally without wheezes or crackles. Heart: Regular rate and rhythm without murmurs, rubs, or gallops. Abdomen: Soft, nontender, nondistended. Extremities: No lower extremity edema with compression stockings in place.  Healing abrasions noted on left forearm.  EKG: Ventricular paced rhythm with underlying atrial fibrillation.  Lab Results  Component Value Date   WBC 6.0 02/15/2022   HGB 14.6 02/15/2022   HCT 45.6 02/15/2022   MCV 90.5 02/15/2022   PLT 197 02/15/2022    Lab Results  Component Value Date   NA 143 05/24/2022   K 4.0 05/24/2022   CL 107 05/24/2022   CO2 28 05/24/2022   BUN 18 05/24/2022   CREATININE 0.96 05/24/2022   GLUCOSE 125 (H) 05/24/2022   ALT 21 05/24/2022    Lab Results  Component  Value Date   CHOL 141 05/24/2022   HDL 63 05/24/2022   LDLCALC 65 05/24/2022   TRIG 67 05/24/2022   CHOLHDL 2.2 05/24/2022    --------------------------------------------------------------------------------------------------  ASSESSMENT AND PLAN: Permanent atrial fibrillation status post pacemaker: Patient is ventricularly paced today.  Continue indefinite warfarin, managed by Dr. Ola Spurr.  Ongoing follow-up with Dr. Quentin Ore and device clinic.  Apical hypertrophic cardiomyopathy: No syncope reported.  Fall last month it was mechanical.  Hypertension: Blood pressure remains suboptimally controlled.  Continue olmesartan 40 mg daily and add amlodipine 2.5 mg daily.  She is scheduled for follow-up with Dr. Ola Spurr in about 2 weeks, at which time blood pressure will be rechecked.  Hyperlipidemia associated with type 2 diabetes mellitus: LDL well-controlled on last check in October (65).  Continue atorvastatin 20 mg daily; ongoing management of diet-controlled DM per Dr. Ola Spurr.  Follow-up: Return to clinic in 6 months.  Nelva Bush, MD 08/17/2022 9:13 AM

## 2022-08-17 NOTE — Patient Instructions (Addendum)
Medication Instructions:  Your physician recommends the following medication changes.  START TAKING: Amlodipine 2.5 mg by mouth daily   *If you need a refill on your cardiac medications before your next appointment, please call your pharmacy*   Lab Work: None ordered today   Testing/Procedures: None ordered today   Follow-Up: At [facility], you and your health needs are our priority.  As part of our continuing mission to provide you with exceptional heart care, we have created designated Provider Care Teams.  These Care Teams include your primary Cardiologist (physician) and Advanced Practice Providers (APPs -  Physician Assistants and Nurse Practitioners) who all work together to provide you with the care you need, when you need it.  We recommend signing up for the patient portal called "MyChart".  Sign up information is provided on this After Visit Summary.  MyChart is used to connect with patients for Virtual Visits (Telemedicine).  Patients are able to view lab/test results, encounter notes, upcoming appointments, etc.  Non-urgent messages can be sent to your provider as well.   To learn more about what you can do with MyChart, go to [email or web address].    Your next appointment:   6 month(s)  The format for your next appointment:   In Person  Provider:   You may see Nelva Bush, MD or one of the following Advanced Practice Providers on your designated Care Team:   Murray Hodgkins, NP Christell Faith, PA-C Cadence Kathlen Mody, PA-C Gerrie Nordmann, NP

## 2022-09-08 ENCOUNTER — Ambulatory Visit (INDEPENDENT_AMBULATORY_CARE_PROVIDER_SITE_OTHER): Payer: Medicare Other

## 2022-09-08 DIAGNOSIS — R001 Bradycardia, unspecified: Secondary | ICD-10-CM | POA: Diagnosis not present

## 2022-09-08 LAB — CUP PACEART REMOTE DEVICE CHECK
Battery Remaining Longevity: 140 mo
Battery Voltage: 3.03 V
Brady Statistic RV Percent Paced: 99.06 %
Date Time Interrogation Session: 20240126002853
Implantable Lead Connection Status: 753985
Implantable Lead Implant Date: 20230125
Implantable Lead Location: 753860
Implantable Lead Model: 3830
Implantable Pulse Generator Implant Date: 20230125
Lead Channel Impedance Value: 342 Ohm
Lead Channel Impedance Value: 494 Ohm
Lead Channel Pacing Threshold Amplitude: 0.5 V
Lead Channel Pacing Threshold Pulse Width: 0.4 ms
Lead Channel Sensing Intrinsic Amplitude: 13.25 mV
Lead Channel Sensing Intrinsic Amplitude: 13.25 mV
Lead Channel Setting Pacing Amplitude: 2 V
Lead Channel Setting Pacing Pulse Width: 0.4 ms
Lead Channel Setting Sensing Sensitivity: 1.2 mV
Zone Setting Status: 755011

## 2022-09-25 NOTE — Progress Notes (Signed)
Remote pacemaker transmission.   

## 2022-11-01 ENCOUNTER — Other Ambulatory Visit: Payer: Self-pay | Admitting: Internal Medicine

## 2022-11-23 NOTE — Progress Notes (Addendum)
Cardiology Office Note Date:  11/24/2022  Patient ID:  Sarah Sweeney, DOB [birth year], MRN [id number] PCP:  Mick SellFitzgerald, David P, MD  Cardiologist:  Yvonne Kendallhristopher End, MD Electrophysiologist: Lanier PrudeAMERON T LAMBERT, MD   Chief Complaint: 1 year PPM follow-up  History of Present Illness: Sarah Sweeney is a 82 y.o. female with PMH notable for permanent Afib, apical variant HCM, symptomatic bradycardia s/p PPM; seen today for Lanier PrudeAMERON T LAMBERT, MD for routine electrophysiology followup.  Last saw Dr. Lalla BrothersLambert 01/03/2022 for routine 91d post-device implant; had improvement in energy levels since PPM implant.  She continues to have energy improvement, feels overall very well Having some dizziness when bending over. BP typically run 120-130s, rarely 110s. She had a [place] while at airport, not related to an orthostatic/dizzy event. Stepped on someone's coat, who then pulled the coat and patient fell.   She is having increased lower extremity edema, noticed it a couple weeks ago.   Coumadin managed at PCP office, no bleeding concerns   she denies chest pain, palpitations, dyspnea, PND, orthopnea, nausea, vomiting, dizziness, syncope, edema, weight gain, or early satiety.    Device Information: MDT single chamber PPM, imp 09/07/2021; dx perm Afib, bradycardia  Past Medical History:  Diagnosis Date   Allergy    Apical variant hypertrophic cardiomyopathy    Atrial fibrillation and flutter    Diabetes mellitus without complication    Erythrocytosis 03/02/2015   GERD (gastroesophageal reflux disease)    Headache    Hypertension    Pulmonary embolism 2013   Coumadin Therapy   Sleep apnea    CPAP   Symptomatic bradycardia    s/p single-chamber pacemaker (08/2021 - Dr. Lalla BrothersLambert)    Past Surgical History:  Procedure Laterality Date   APPENDECTOMY     CARDIOVERSION N/A 07/17/2019   Procedure: CARDIOVERSION;  Surgeon: Antonieta IbaGollan, Timothy J, MD;  Location: [facility];  Service:  Cardiovascular;  Laterality: N/A;   CATARACT EXTRACTION W/PHACO Left 09/21/2020   Procedure: CATARACT EXTRACTION PHACO AND INTRAOCULAR LENS PLACEMENT (IOC) LEFT 4.94 00:36.2;  Surgeon: Galen ManilaPorfilio, William, MD;  Location: [facility];  Service: Ophthalmology;  Laterality: Left;  sleep apnea   CATARACT EXTRACTION W/PHACO Right 10/05/2020   Procedure: CATARACT EXTRACTION PHACO AND INTRAOCULAR LENS PLACEMENT (IOC) RIGHT 4.44 00:34.3 ;  Surgeon: Galen ManilaPorfilio, William, MD;  [facility];  Service: Ophthalmology;  Laterality: Right;  Diabetic - diet controlled   COLONOSCOPY WITH PROPOFOL N/A 03/04/2019   Procedure: COLONOSCOPY WITH PROPOFOL;  Surgeon: Toledo, Boykin Nearingeodoro K, MD;  Location: [facility];  Service: Gastroenterology;  Laterality: N/A;   PACEMAKER IMPLANT N/A 09/08/2021   Procedure: PACEMAKER IMPLANT;  Surgeon: Lanier PrudeLambert, Cameron T, MD;  [facility];  Service: Cardiovascular;  Laterality: N/A;   TONSILLECTOMY      Current Outpatient Medications  Medication Instructions   acetaminophen (TYLENOL) 325-650 mg, Oral, Every 4 hours PRN   amLODipine (NORVASC) 2.5 mg, Oral, Daily   atorvastatin (LIPITOR) 20 mg, Oral, Daily   carvedilol (COREG) 3.125 mg, Oral, 2 times daily with meals   diclofenac Sodium (VOLTAREN) 1 % GEL 1 application , Topical, 2 times daily PRN   latanoprost (XALATAN) 0.005 % ophthalmic solution 1 drop, Both Eyes, Daily at bedtime   olmesartan (BENICAR) 40 mg, Oral, Daily   omeprazole (PRILOSEC) 40 mg, Oral, Daily   oxybutynin (DITROPAN XL) 15 mg, Oral, Daily at bedtime   warfarin (COUMADIN) 2-3 mg, Oral, See admin instructions, 2 mg on Tue and Fri,  and 3 mg all other days   warfarin (COUMADIN) 3 mg, Oral, Daily, Taking 2 mg Mon, Wed, Fri and 3 mg on Sun, Tu, Th, Sat<BR>    Social History:  The patient  reports that she has never smoked. She has never used smokeless tobacco. She reports that she does not drink alcohol and does not use drugs.    Family History:  The patient's family history includes Breast cancer in her maternal aunt; Cancer in her maternal uncle; Hypertension in her mother; Stroke in her maternal grandfather.  ROS:  Please see the history of present illness. All other systems are reviewed and otherwise negative.   PHYSICAL EXAM:  VS:  BP 112/60 (BP Location: Left Arm, Patient Position: Sitting, Cuff Size: Normal)   Pulse 66   Ht 5\' 4"  (1.626 m)   Wt 163 lb 6.4 oz (74.1 kg)   SpO2 97%   BMI 28.05 kg/m  BMI: Body mass index is 28.05 kg/m.  Orthostatic VS for the past 24 hrs (Last 3 readings):  BP- Lying Pulse- Lying BP- Sitting Pulse- Sitting BP- Standing at 0 minutes Pulse- Standing at 0 minutes BP- Standing at 3 minutes Pulse- Standing at 3 minutes  11/24/22 1446 127/75 60 112/67 67 122/66 66 126/73 60    GEN- The patient is well appearing, alert and oriented x 3 today.   Lungs- Clear to ausculation bilaterally, normal work of breathing.  Heart- Regular rate and rhythm, no murmurs, rubs or gallops Extremities- 1-2+ peripheral edema up to knee, warm, dry Skin-  device pocket well-healed  Device interrogation done today and reviewed by myself:  Battery good Lead threshold, impedence, sensing stable  Rare, brief NSVT episodes No changes made today  Dependent  EKG is not ordered.   Recent Labs: 02/14/2022: B Natriuretic Peptide 271.2; TSH 2.936 02/15/2022: Hemoglobin 14.6; Platelets 197 05/24/2022: ALT 21; BUN 18; Creatinine, Ser 0.96; Potassium 4.0; Sodium 143  05/24/2022: Cholesterol 141; HDL 63; LDL Cholesterol 65; Total CHOL/HDL Ratio 2.2; Triglycerides 67; VLDL 13   CrCl cannot be calculated (Patient's most recent lab result is older than the maximum 21 days allowed.).   Wt Readings from Last 3 Encounters:  11/24/22 163 lb 6.4 oz (74.1 kg)  08/17/22 161 lb (73 kg)  05/11/22 159 lb (72.1 kg)     Additional studies reviewed include: Previous EP, cardiology notes.   TTE, 02/15/2022  1. Left  ventricular ejection fraction, by estimation, is 55 to 60%. The left ventricle has normal function. The left ventricle has no regional wall motion abnormalities. Left ventricular diastolic parameters were normal.   2. Right ventricular systolic function is normal. The right ventricular size is normal.   3. The mitral valve is grossly normal. No evidence of mitral valve regurgitation.   4. The aortic valve is grossly normal. Aortic valve regurgitation is not visualized.   5. The inferior vena cava is normal in size with greater than 50% respiratory variability, suggesting right atrial pressure of 3 mmHg.   TTE, 07/21/2021  1. Left ventricular ejection fraction, by estimation, is 60 to 65%. The left ventricle has normal function. The left ventricle has no regional wall motion abnormalities. The left ventricular internal cavity size was mildly dilated. There is mild to  moderate concentric left ventricular hypertrophy with severe apical hypertrophy. Left ventricular diastolic parameters are indeterminate.   2. Right ventricular systolic function is normal. The right ventricular size is normal. There is moderately elevated pulmonary artery systolic pressure. The estimated right ventricular  systolic pressure is 46.2 mmHg.   3. Left atrial size was mild to moderately dilated.   4. The mitral valve is normal in structure. Moderate mitral valve regurgitation. No evidence of mitral stenosis.   5. The aortic valve is normal in structure. Aortic valve regurgitation is not visualized. No aortic stenosis is present.   6. The inferior vena cava is normal in size with greater than 50% respiratory variability, suggesting right atrial pressure of 3 mmHg.   7. Rhythm is atrial fib/flutter   Comparison(s): LVEF 50-55%, apical hypertrophic cardiomyopathy.   ASSESSMENT AND PLAN:  #) perm Afib #) symptomatic bradycardia s/p PPM Device functioning well, see paceart for details  CHA2DS2-VASc Score = 5 (CHF, HTN, Age,  gender)  OAC - warfarin, mgmt by PCP office      #) HTN #) increased lower extremity edema Amlodipine recently increased from 2.5 > 5mg  Query whether dose increased is associated with increased lower extremity edema Will lower amlodipine back to 2.5mg  Add 3.125mg  coreg BID Cont omesartan Recommended patient cont to monitor BP daily and record readings, notify office if readings decrease substantially or increase as further coreg adjustments may be needed. She has follow-up appt with PCP in 4 days    Current medicines are reviewed at length with the patient today.   The patient does not have concerns regarding her medicines.  The following changes were made today:   DECREASE amlodipine 2.5mg  daily START coreg 3.125mg  BID  Labs/ tests ordered today include:  Orders Placed This Encounter  Procedures   EKG 12-Lead     Disposition: Follow up with Dr. Lalla Brothers or EP APP in in 12 months   Signed, Sherie Don, NP  11/24/22  4:26 PM  Electrophysiology [facility]

## 2022-11-24 ENCOUNTER — Encounter: Payer: Self-pay | Admitting: Cardiology

## 2022-11-24 ENCOUNTER — Ambulatory Visit: Payer: Medicare Other | Attending: Cardiology | Admitting: Cardiology

## 2022-11-24 VITALS — BP 112/60 | HR 66 | Ht 64.0 in | Wt 163.4 lb

## 2022-11-24 DIAGNOSIS — Z950 Presence of cardiac pacemaker: Secondary | ICD-10-CM | POA: Diagnosis not present

## 2022-11-24 DIAGNOSIS — Z7901 Long term (current) use of anticoagulants: Secondary | ICD-10-CM | POA: Diagnosis not present

## 2022-11-24 DIAGNOSIS — R600 Localized edema: Secondary | ICD-10-CM

## 2022-11-24 DIAGNOSIS — I10 Essential (primary) hypertension: Secondary | ICD-10-CM

## 2022-11-24 DIAGNOSIS — I4821 Permanent atrial fibrillation: Secondary | ICD-10-CM | POA: Diagnosis not present

## 2022-11-24 LAB — CUP PACEART INCLINIC DEVICE CHECK
Date Time Interrogation Session: 20240412163047
Implantable Lead Connection Status: 753985
Implantable Lead Implant Date: 20230125
Implantable Lead Location: 753860
Implantable Lead Model: 3830
Implantable Pulse Generator Implant Date: 20230125

## 2022-11-24 MED ORDER — AMLODIPINE BESYLATE 2.5 MG PO TABS: 2.5000 mg | ORAL_TABLET | Freq: Every day | ORAL | 2 refills | Status: DC

## 2022-11-24 MED ORDER — CARVEDILOL 3.125 MG PO TABS: 3.1250 mg | ORAL_TABLET | Freq: Two times a day (BID) | ORAL | 2 refills | Status: DC

## 2022-11-24 NOTE — Patient Instructions (Signed)
Medication Instructions:  DECREASE amlodipine to 2.5 mg by mouth once daily START carvedilol 3.125 mg by mouth twice a day  *If you need a refill on your cardiac medications before your next appointment, please call your pharmacy*   Lab Work: No labs ordered  If you have labs (blood work) drawn today and your tests are completely normal, you will receive your results only by: MyChart Message (if you have MyChart) OR A paper copy in the mail If you have any lab test that is abnormal or we need to change your treatment, we will call you to review the results.   Testing/Procedures: No testing ordered  Follow-Up: At [facility], you and your health needs are our priority.  As part of our continuing mission to provide you with exceptional heart care, we have created designated Provider Care Teams.  These Care Teams include your primary Cardiologist (physician) and Advanced Practice Providers (APPs -  Physician Assistants and Nurse Practitioners) who all work together to provide you with the care you need, when you need it.  We recommend signing up for the patient portal called "MyChart".  Sign up information is provided on this After Visit Summary.  MyChart is used to connect with patients for Virtual Visits (Telemedicine).  Patients are able to view lab/test results, encounter notes, upcoming appointments, etc.  Non-urgent messages can be sent to your provider as well.   To learn more about what you can do with MyChart, go to [email or web address].    Your next appointment:   1 year(s)  Provider:   Steffanie Dunn, MD or Sherie Don, NP

## 2022-11-28 ENCOUNTER — Encounter: Payer: Self-pay | Admitting: Pulmonary Disease

## 2022-11-28 ENCOUNTER — Ambulatory Visit: Payer: Medicare Other | Admitting: Pulmonary Disease

## 2022-11-28 VITALS — BP 124/72 | HR 85 | Temp 98.0°F | Ht 64.0 in | Wt 162.8 lb

## 2022-11-28 DIAGNOSIS — G4733 Obstructive sleep apnea (adult) (pediatric): Secondary | ICD-10-CM | POA: Diagnosis not present

## 2022-11-28 NOTE — Progress Notes (Signed)
[name]  Chief Complaint  Patient presents with   Follow-up    Wearing cpap nightly-pressure and mask is okay.     Constitutional:  BP 124/72 (BP Location: Left Arm, Cuff Size: Normal)   Pulse 85   Temp 98 F (36.7 C) (Temporal)   Ht  (1.626 m)   Wt 162 lb 12.8 oz (73.8 kg)   SpO2 95%   BMI 27.94 kg/m   Past Medical History:  DM, GERD, HA, HTN, PE 2013, Persistent A fib, Hypertrophic CM, non sustained VT  Past Surgical History:  She  has a past surgical history that includes Appendectomy; Tonsillectomy; Colonoscopy with propofol (N/A, 03/04/2019); Cardioversion (N/A, 07/17/2019); Cataract extraction w/PHACO (Left, 09/21/2020); Cataract extraction w/PHACO (Right, 10/05/2020); and PACEMAKER IMPLANT (N/A, 09/08/2021).  Brief Summary:  Sarah Sweeney is a 82 y.o. female with obstructive sleep apnea.      Subjective:   She is doing well with CPAP.  She has a nasal pillow mask.  Not issue with mask fit or pressure setting.  Not having sinus congestion.  Has mild mouth dryness.  Feels rested during the day.  Physical Exam:   Appearance - well kempt   ENMT - no sinus tenderness, no oral exudate, no LAN, Mallampati 3 airway, no stridor  Respiratory - equal breath sounds bilaterally, no wheezing or rales  CV - s1s2 regular rate and rhythm, no murmurs  Ext - no clubbing, no edema  Skin - no rashes  Psych - normal mood and affect     Chest Imaging:  CT chest 10/18/15 >> LLL nodule stable since 2014, coronary calcification, cholelithiasis  Sleep Tests:  HST 11/04/19 >> AHI 35.7, SpO2 low 81% Auto CPAP 10/28/22 to 11/26/22 >> used on 30 of 30 nights with average 7 hrs 20 min.  Average AHI 5.3 with median CPAP 8 and 95 th percentile CPAP 11 cm H2O  Cardiac Tests:  Echo 02/15/22 >> EF 55 to 60%  Social History:  She  reports that she has never smoked. She has never used smokeless tobacco. She reports that she does not drink  alcohol and does not use drugs.  Family History:  Her family history includes Breast cancer in her maternal aunt; Cancer in her maternal uncle; Hypertension in her mother; Stroke in her maternal grandfather.     Assessment/Plan:   Obstructive sleep apnea. - she is compliant with CPAP and reports benefit from therapy - she uses [employer] for her DME - her current CPAP was ordered May 2021 - continue auto CPAP 5 to 15 cm H2O  Chronic atrial fibrillation, Symptomatic bradycardia s/p PPM. - followed by Dr. Cristal Deer End with [facility]   Time Spent Involved in Patient Care on Day of Examination:  15 minutes  Follow up:   Patient Instructions  Follow up in 1 year   Medication List:   Allergies as of 11/28/2022   No Known Allergies      Medication List        Accurate as of November 28, 2022  3:04 PM. If you have any questions, ask your nurse or doctor.          acetaminophen 325 MG tablet Commonly known as: TYLENOL Take 1-2 tablets (325-650 mg total) by mouth every 4 (four) hours as needed for mild pain.   amLODipine 2.5 MG tablet Commonly known as: NORVASC Take 1 tablet (2.5 mg total) by mouth daily.   atorvastatin 20 MG  tablet Commonly known as: LIPITOR TAKE 1 TABLET BY MOUTH DAILY   carvedilol 3.125 MG tablet Commonly known as: Coreg Take 1 tablet (3.125 mg total) by mouth 2 (two) times daily with a meal.   diclofenac Sodium 1 % Gel Commonly known as: VOLTAREN Apply 1 application topically 2 (two) times daily as needed (pain).   latanoprost 0.005 % ophthalmic solution Commonly known as: XALATAN Place 1 drop into both eyes at bedtime.   olmesartan 40 MG tablet Commonly known as: BENICAR Take 1 tablet (40 mg total) by mouth daily.   omeprazole 40 MG capsule Commonly known as: PRILOSEC Take 40 mg by mouth daily.   oxybutynin 15 MG 24 hr tablet Commonly known as: DITROPAN XL Take 15 mg by mouth at bedtime.   warfarin 2 MG tablet Commonly known  as: COUMADIN Take 1-1.5 tablets (2-3 mg total) by mouth See admin instructions. 2 mg on Tue and Fri, and 3 mg all other days What changed: additional instructions   warfarin 3 MG tablet Commonly known as: COUMADIN Take 3 mg by mouth daily. Taking 2 mg Mon, Wed, Fri and 3 mg on Sun, Tu, Th, Sat What changed: Another medication with the same name was changed. Make sure you understand how and when to take each.        Signature:  Coralyn Helling, MD [facility] Pager - [phone] 11/28/2022, 3:04 PM

## 2022-11-28 NOTE — Patient Instructions (Signed)
Follow up in 1 year.

## 2022-12-06 ENCOUNTER — Ambulatory Visit: Payer: Medicare Other | Admitting: Pulmonary Disease

## 2022-12-08 ENCOUNTER — Ambulatory Visit (INDEPENDENT_AMBULATORY_CARE_PROVIDER_SITE_OTHER): Payer: Medicare Other

## 2022-12-08 DIAGNOSIS — I422 Other hypertrophic cardiomyopathy: Secondary | ICD-10-CM | POA: Diagnosis not present

## 2022-12-08 LAB — CUP PACEART REMOTE DEVICE CHECK
Battery Remaining Longevity: 137 mo
Battery Voltage: 3.02 V
Brady Statistic RV Percent Paced: 99.96 %
Date Time Interrogation Session: 20240425234735
Implantable Lead Connection Status: 753985
Implantable Lead Implant Date: 20230125
Implantable Lead Location: 753860
Implantable Lead Model: 3830
Implantable Pulse Generator Implant Date: 20230125
Lead Channel Impedance Value: 342 Ohm
Lead Channel Impedance Value: 494 Ohm
Lead Channel Pacing Threshold Amplitude: 0.625 V
Lead Channel Pacing Threshold Pulse Width: 0.4 ms
Lead Channel Sensing Intrinsic Amplitude: 13.25 mV
Lead Channel Sensing Intrinsic Amplitude: 13.25 mV
Lead Channel Setting Pacing Amplitude: 2 V
Lead Channel Setting Pacing Pulse Width: 0.4 ms
Lead Channel Setting Sensing Sensitivity: 1.2 mV
Zone Setting Status: 755011

## 2023-01-02 NOTE — Progress Notes (Signed)
Remote pacemaker transmission.   

## 2023-02-21 ENCOUNTER — Ambulatory Visit: Payer: Medicare Other | Attending: Internal Medicine | Admitting: Internal Medicine

## 2023-02-21 ENCOUNTER — Encounter: Payer: Self-pay | Admitting: Internal Medicine

## 2023-02-21 VITALS — BP 130/70 | HR 70 | Ht 64.0 in | Wt 161.2 lb

## 2023-02-21 DIAGNOSIS — I422 Other hypertrophic cardiomyopathy: Secondary | ICD-10-CM

## 2023-02-21 DIAGNOSIS — E1169 Type 2 diabetes mellitus with other specified complication: Secondary | ICD-10-CM

## 2023-02-21 DIAGNOSIS — I10 Essential (primary) hypertension: Secondary | ICD-10-CM

## 2023-02-21 DIAGNOSIS — E785 Hyperlipidemia, unspecified: Secondary | ICD-10-CM

## 2023-02-21 DIAGNOSIS — I4821 Permanent atrial fibrillation: Secondary | ICD-10-CM

## 2023-02-21 DIAGNOSIS — Z7984 Long term (current) use of oral hypoglycemic drugs: Secondary | ICD-10-CM

## 2023-02-21 NOTE — Progress Notes (Signed)
Cardiology Office Note:  .   Date:  02/21/2023  ID:  Sarah Sweeney, DOB February 18, 1941, MRN [id number] PCP: Mick Sell, MD  [facility] Providers Cardiologist:  Yvonne Kendall, MD Electrophysiologist:  Lanier Prude, MD     History of Present Illness: .   Sarah Sweeney is a 82 y.o. 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  I last saw her in January, at which time she was feeling well from a heart standpoint.  She noted a mechanical fall a month earlier while [place] the airport.  Due to persistently elevated blood pressure, we agreed to add amlodipine 2.5 mg daily.  This was subsequently increased to 5 mg daily.  She was seen in follow-up in the EP clinic with Levy Sjogren, NP, in April, at which time she reported increased lower extremity edema prompting reduction of amlodipine dose back to 2.5 mg daily.  Carvedilol 3.125 mg twice daily was added to help improve blood pressure control.  Today, Sarah Sweeney reports that she has been feeling fairly well.  Unfortunately, she did not tolerate carvedilol due to fatigue.  She stopped it about a month ago with resolution of symptoms.  She was also started on vitamin B12 supplementation around that time by her PCP.  She notes some mild exertional dyspnea when walking up steps, which has been stable.  She denies chest pain and palpitations.  She has occasional brief orthostatic lightheadedness but has not passed out or fallen.  She denies bleeding and continues to follow with Dr. Sampson Goon for management of her INRs.  She reports her blood pressures at home are typically well-controlled with systolic  readings around 120 mmHg.  ROS: See HPI  Studies Reviewed: Marland Kitchen   EKG Interpretation Date/Time:  Wednesday February 21 2023 16:11:44 EDT Ventricular Rate:  70 PR Interval:    QRS Duration:  124 QT Interval:  422 QTC Calculation: 455 R Axis:   -37  Text Interpretation: Ventricular-paced rhythm When compared with ECG of 24-Nov-2022 No significant change was found Confirmed by Shakelia Scrivner, Cristal Deer [phone]) on 02/21/2023 4:20:55 PM    TTE (07/21/2021): Mildly dilated left ventricle with moderate left ventricular hypertrophy.  LVEF 60-65% with no wall motion abnormalities.  Indeterminate diastolic parameters.  Normal RV size and function with moderate pulmonary hypertension (RVSP 46 mmHg).  Moderate left atrial enlargement.  No pericardial effusion.  Moderate mitral regurgitation.  Mild tricuspid regurgitation.  Normal aortic valve.  Normal CVP.  Risk Assessment/Calculations:    CHA2DS2-VASc Score = 5   This indicates a 7.2% annual risk of stroke. The patient's score is based upon: CHF History: 1 HTN History: 1 Diabetes History: 0 Stroke History: 0 Vascular Disease History: 0 Age Score: 2 Gender Score: 1     Physical Exam:   VS:  BP 130/70 (BP Location: Left Arm, Patient Position: Sitting, Cuff Size: Normal)   Pulse 70   Ht 5\' 4"  (1.626 m)   Wt 161 lb 4 oz (73.1 kg)   SpO2 95%   BMI 27.68 kg/m    Wt Readings from Last 3 Encounters:  02/21/23 161 lb 4 oz (73.1 kg)  11/28/22 162 lb 12.8 oz (73.8 kg)  11/24/22 163 lb 6.4 oz (74.1 kg)    General:  NAD.  Neck: No JVD or HJR. Lungs: Clear to auscultation bilaterally without wheezes or crackles. Heart: Regular rate and rhythm without murmurs, rubs, or gallops. Abdomen: Soft, nontender, nondistended. Extremities: No lower extremity edema.  ASSESSMENT AND PLAN: .    Permanent atrial fibrillation: Sarah Sweeney continues to do well with predominantly ventricular pacing in the setting of permanent atrial fibrillation and prior symptomatic  bradycardia.  She is doing well with warfarin and is not interested in transitioning to a DOAC at this time.  Continue indefinite anticoagulation with INR monitoring through Dr. Jarrett Ables office.  Ongoing device follow-up through EP.  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  She has not had any significant arrhythmias noted on her device interrogations.  Unfortunately, she did not tolerate low-dose carvedilol due to fatigue.  We will defer medication changes at this time.  Hyperlipidemia associated with type 2 diabetes mellitus: Lipids well-controlled on last check in our office in October with LDL of 65.  Continue atorvastatin.  Ongoing management of DM per Dr. Sampson Goon.  Hypertension: Blood pressure borderline elevated today in the office but typically better at home (goal blood pressure less than 130/80).  Continue current medications.    Dispo: Return to clinic in 6 months.  Signed, Yvonne Kendall, MD

## 2023-02-21 NOTE — Patient Instructions (Signed)
Medication Instructions:  Your physician recommends that you continue on your current medications as directed. Please refer to the Current Medication list given to you today.    *If you need a refill on your cardiac medications before your next appointment, please call your pharmacy*   Lab Work: None ordered today   Testing/Procedures: None ordered today   Follow-Up: At [facility], you and your health needs are our priority.  As part of our continuing mission to provide you with exceptional heart care, we have created designated Provider Care Teams.  These Care Teams include your primary Cardiologist (physician) and Advanced Practice Providers (APPs -  Physician Assistants and Nurse Practitioners) who all work together to provide you with the care you need, when you need it.  We recommend signing up for the patient portal called "MyChart".  Sign up information is provided on this After Visit Summary.  MyChart is used to connect with patients for Virtual Visits (Telemedicine).  Patients are able to view lab/test results, encounter notes, upcoming appointments, etc.  Non-urgent messages can be sent to your provider as well.   To learn more about what you can do with MyChart, go to [email or web address].    Your next appointment:   6 month(s)  Provider:   You may see Christopher End, MD or one of the following Advanced Practice Providers on your designated Care Team:   Christopher Berge, NP Ryan Dunn, PA-C Cadence Furth, PA-C Sheri Hammock, NP      

## 2023-02-23 ENCOUNTER — Encounter: Payer: Self-pay | Admitting: Internal Medicine

## 2023-03-09 ENCOUNTER — Ambulatory Visit (INDEPENDENT_AMBULATORY_CARE_PROVIDER_SITE_OTHER): Payer: Medicare Other

## 2023-03-09 DIAGNOSIS — R001 Bradycardia, unspecified: Secondary | ICD-10-CM | POA: Diagnosis not present

## 2023-03-09 LAB — CUP PACEART REMOTE DEVICE CHECK
Battery Voltage: 3.01 V
Lead Channel Setting Pacing Amplitude: 2 V

## 2023-03-16 NOTE — Progress Notes (Signed)
Remote pacemaker transmission.   

## 2023-04-19 IMAGING — US US EXTREM LOW VENOUS*R*
1 series · 13 of 24 positions shown · non-contrast
Comparison: 12/31/2017

CLINICAL DATA: Posterior right knee pain for 1 week



[Series 1: us venous img lower uni right (dvt) · portal-venous · 53 acquisitions, 13 frames shown]
[im 1/53]
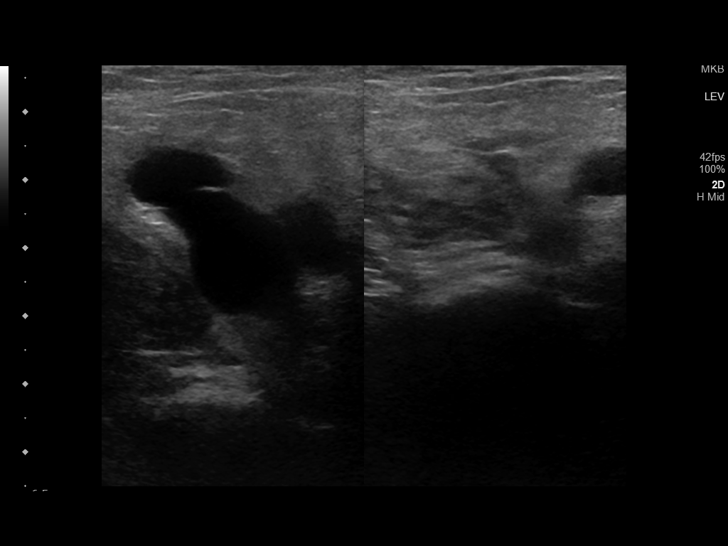
[im 5/53]
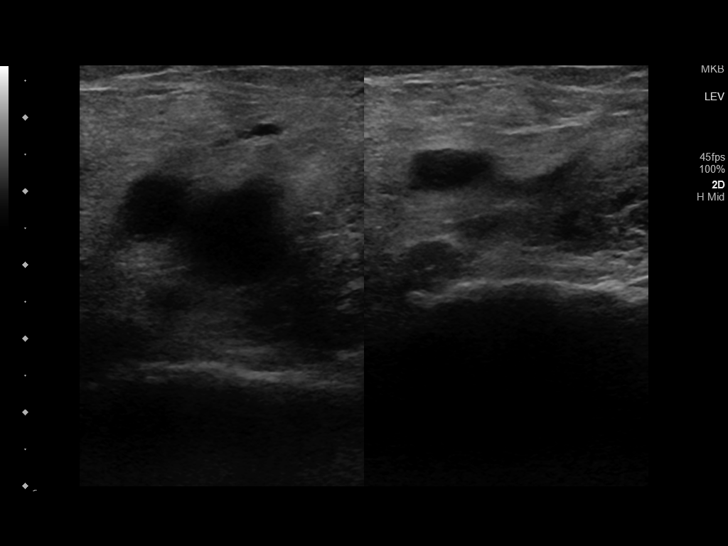
[im 10/53]
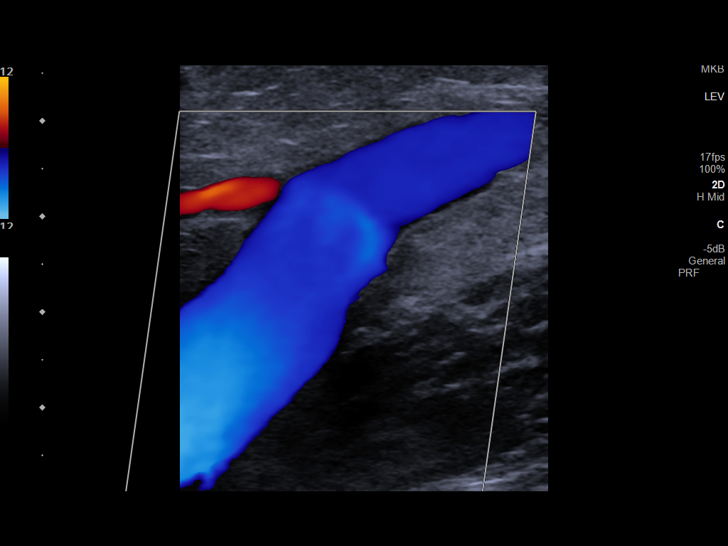
[im 14/53]
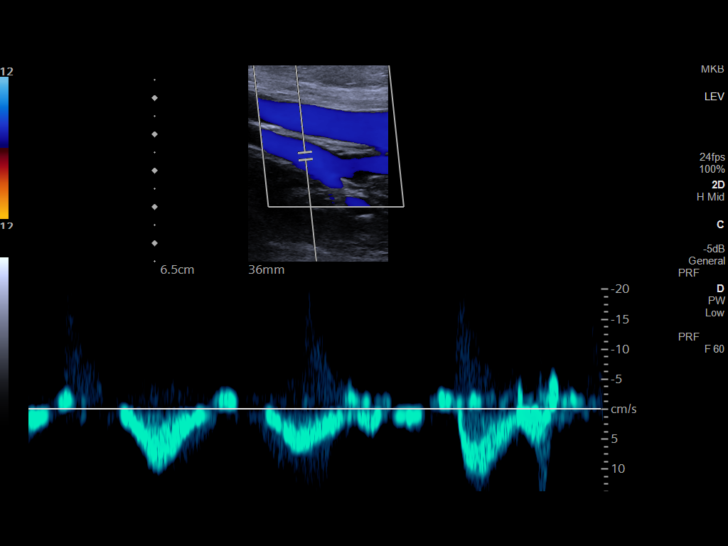
[im 19/53]
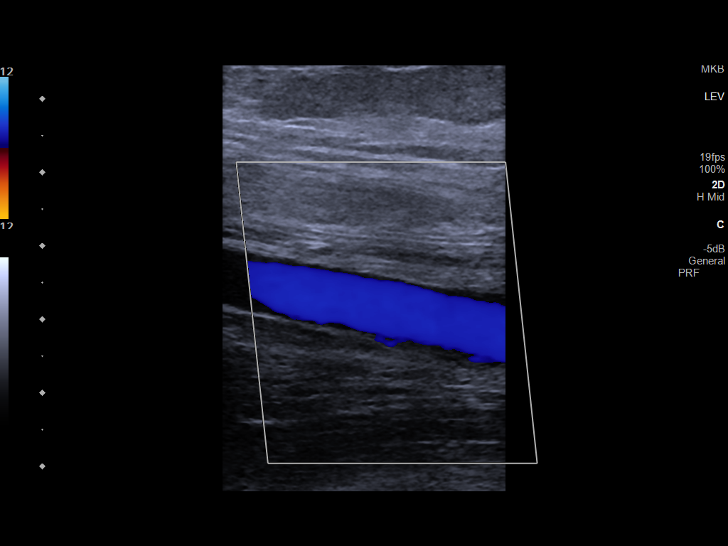
[im 23/53]
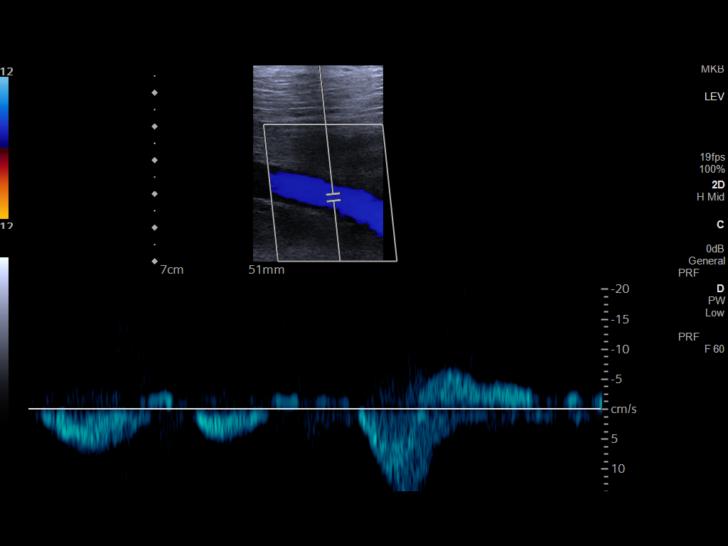
[im 30/53]
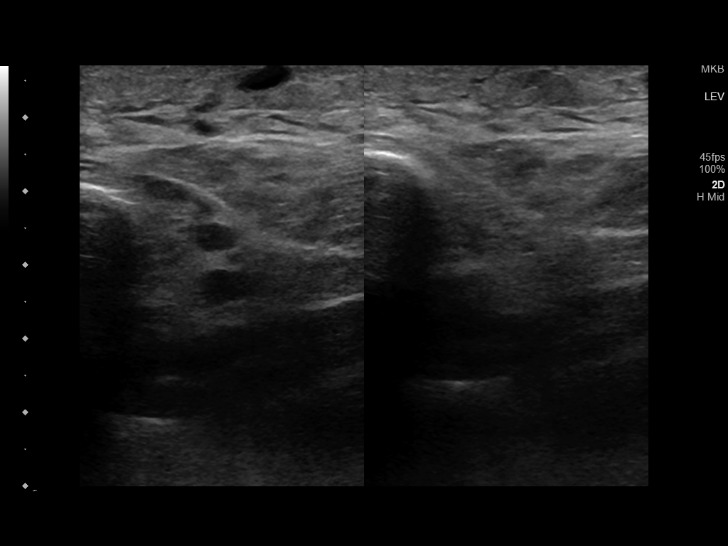
[im 32/53]
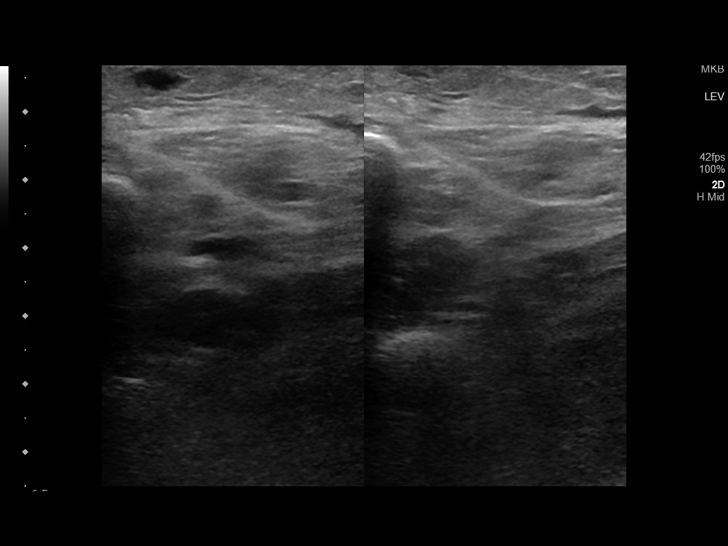
[im 37/53]
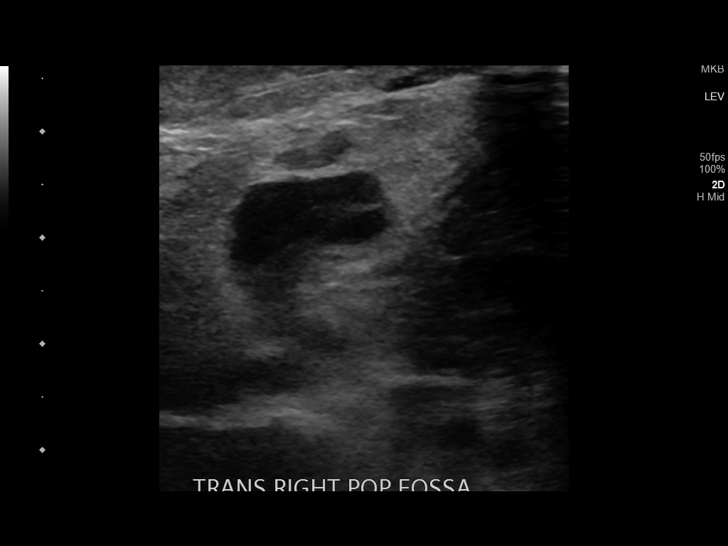
[im 41/53]
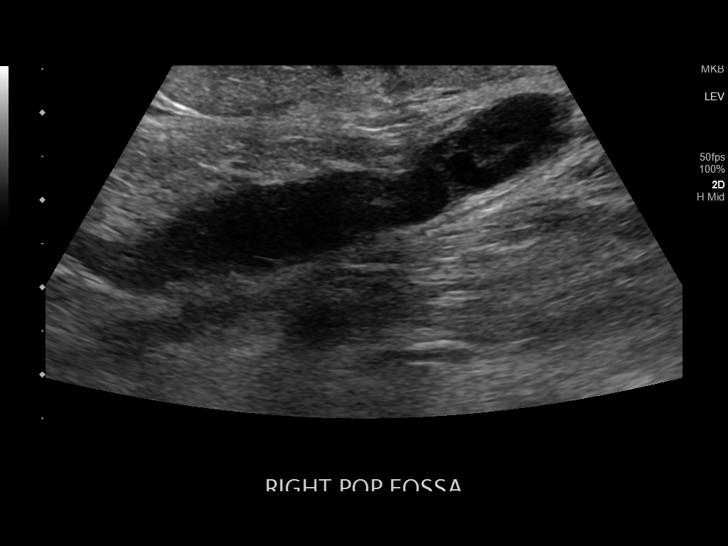
[im 46/53]
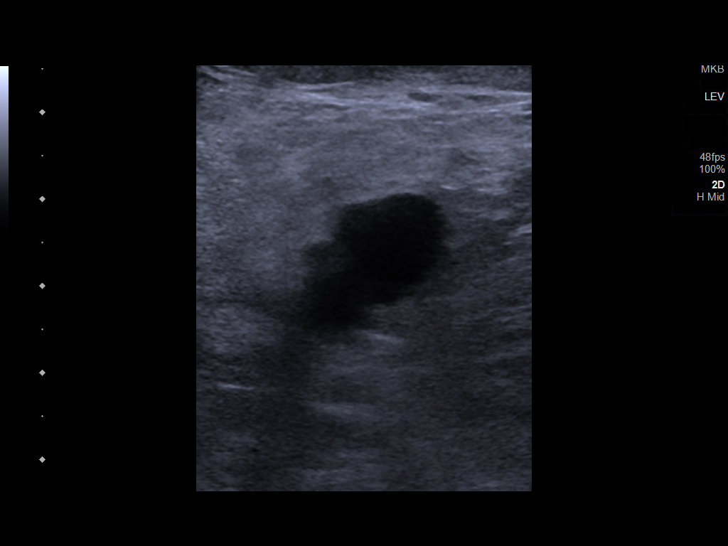
[im 48/53]
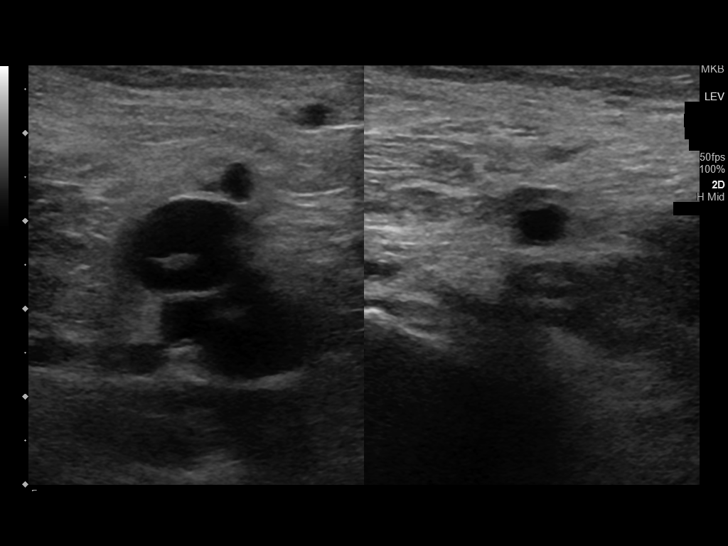
[im 53/53]
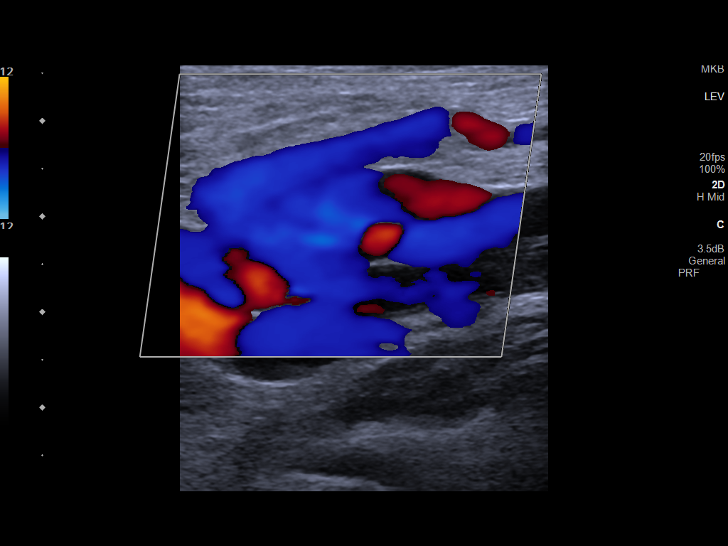

[13 of 24 positions shown; findings below may reference images not displayed]

FINDINGS: Contralateral Common Femoral Vein: Respiratory phasicity is normal
and symmetric with the symptomatic side. No evidence of thrombus.
Normal compressibility.

Common Femoral Vein: No evidence of thrombus. Normal
compressibility, respiratory phasicity and response to augmentation.

Saphenofemoral Junction: No evidence of thrombus. Normal
compressibility and flow on color Doppler imaging.

Profunda Femoral Vein: No evidence of thrombus. Normal
compressibility and flow on color Doppler imaging.

Femoral Vein: No evidence of thrombus. Normal compressibility,
respiratory phasicity and response to augmentation.

Popliteal Vein: Localized thin and echogenic strandy intraluminal
abnormality compatible with chronic changes of prior
DVT/recanalization. Popliteal vein is completely compressible
without any evidence of acute DVT.

Calf Veins: No evidence of thrombus. Normal compressibility and flow
on color Doppler imaging.

Other Findings: Elongated, collapsed in septated bakers cyst
measures 5.8 x 1.1 x 2.0 cm.
IMPRESSION: Negative for acute DVT.

Suspect sequelae from prior popliteal DVT, completely recanalized
with venous scarring.

Chronic popliteal fossa Baker's cyst.

## 2023-04-27 ENCOUNTER — Other Ambulatory Visit: Payer: Self-pay | Admitting: Internal Medicine

## 2023-05-16 ENCOUNTER — Other Ambulatory Visit: Payer: Self-pay | Admitting: Internal Medicine

## 2023-06-08 ENCOUNTER — Ambulatory Visit: Payer: Medicare Other | Attending: Cardiology

## 2023-06-08 DIAGNOSIS — R001 Bradycardia, unspecified: Secondary | ICD-10-CM | POA: Diagnosis not present

## 2023-06-11 LAB — CUP PACEART REMOTE DEVICE CHECK
Battery Remaining Longevity: 133 mo
Battery Voltage: 3.01 V
Brady Statistic RV Percent Paced: 99.62 %
Date Time Interrogation Session: 20241025012857
Implantable Lead Connection Status: 753985
Implantable Lead Implant Date: 20230125
Implantable Lead Location: 753860
Implantable Lead Model: 3830
Implantable Pulse Generator Implant Date: 20230125
Lead Channel Impedance Value: 361 Ohm
Lead Channel Impedance Value: 494 Ohm
Lead Channel Pacing Threshold Amplitude: 0.5 V
Lead Channel Pacing Threshold Pulse Width: 0.4 ms
Lead Channel Sensing Intrinsic Amplitude: 15.375 mV
Lead Channel Sensing Intrinsic Amplitude: 15.375 mV
Lead Channel Setting Pacing Amplitude: 2 V
Lead Channel Setting Pacing Pulse Width: 0.4 ms
Lead Channel Setting Sensing Sensitivity: 1.2 mV
Zone Setting Status: 755011

## 2023-06-26 NOTE — Progress Notes (Signed)
Remote pacemaker transmission.   

## 2023-08-02 ENCOUNTER — Other Ambulatory Visit: Payer: Self-pay | Admitting: Internal Medicine

## 2023-08-17 ENCOUNTER — Other Ambulatory Visit: Payer: Self-pay | Admitting: Internal Medicine

## 2023-08-17 ENCOUNTER — Other Ambulatory Visit: Payer: Self-pay | Admitting: Cardiology

## 2023-09-07 ENCOUNTER — Ambulatory Visit (INDEPENDENT_AMBULATORY_CARE_PROVIDER_SITE_OTHER): Payer: Medicare Other

## 2023-09-07 DIAGNOSIS — R001 Bradycardia, unspecified: Secondary | ICD-10-CM | POA: Diagnosis not present

## 2023-09-08 LAB — CUP PACEART REMOTE DEVICE CHECK
Battery Remaining Longevity: 130 mo
Battery Voltage: 3.01 V
Brady Statistic RV Percent Paced: 99.86 %
Date Time Interrogation Session: 20250123195604
Implantable Lead Connection Status: 753985
Implantable Lead Implant Date: 20230125
Implantable Lead Location: 753860
Implantable Lead Model: 3830
Implantable Pulse Generator Implant Date: 20230125
Lead Channel Impedance Value: 361 Ohm
Lead Channel Impedance Value: 494 Ohm
Lead Channel Pacing Threshold Amplitude: 0.5 V
Lead Channel Pacing Threshold Pulse Width: 0.4 ms
Lead Channel Sensing Intrinsic Amplitude: 10.625 mV
Lead Channel Sensing Intrinsic Amplitude: 10.625 mV
Lead Channel Setting Pacing Amplitude: 2 V
Lead Channel Setting Pacing Pulse Width: 0.4 ms
Lead Channel Setting Sensing Sensitivity: 1.2 mV
Zone Setting Status: 755011

## 2023-09-09 ENCOUNTER — Encounter: Payer: Self-pay | Admitting: Cardiology

## 2023-10-03 ENCOUNTER — Ambulatory Visit: Payer: Medicare Other | Admitting: Internal Medicine

## 2023-10-17 NOTE — Progress Notes (Signed)
 Remote pacemaker transmission.

## 2023-10-17 NOTE — Addendum Note (Signed)
 Addended by: Elease Etienne A on: 10/17/2023 11:39 AM   Modules accepted: Orders

## 2023-10-25 NOTE — Progress Notes (Unsigned)
 Cardiology Clinic Note   Date: 10/26/2023 ID: Sarah, Sweeney 1941/06/13, MRN [id number]  [facility] Providers Cardiologist:  Yvonne Kendall, MD Electrophysiologist:  Lanier Prude, MD {  Chief Complaint   Sarah Sweeney is a 83 y.o. female who presents to the clinic today for routine follow up.   Patient Profile   Sarah Sweeney is followed by Dr. Okey Dupre and Dr. Lalla Brothers for the history outlined below.      Past medical history significant for: Permanent A-fib/flutter. Onset July 2020. DCCV 07/17/2019. Symptomatic bradycardia. 7-day ZIO 06/15/2021: HR 22 to 107 bpm, average 44 bpm.  100% atrial flutter.  Rare ventricular ectopy.  Several pauses with longest over 6 seconds.  Some pauses/bradycardic episodes occur during daytime hours. 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 Remote device check 09/06/2023: Normal device function.  Stable lead parameters. Apical variant hypertrophic cardiomyopathy. Cardiac MRI 04/15/2019: Hypertrophy of LV apex consistent with apical hypertrophic cardiomyopathy.  Late gadolinium enhancement of the inferior RV insertion site and patchy LGE at the apex consistent with apical HCM.  Normal LV size and systolic function, EF 63%.  Normal RV size and systolic function, EF 55%.  Mild MR. Echo 02/15/2022: EF 55 to 60%.  No RWMA.  Normal diastolic parameters.  Normal RV size/function.  No significant valvular abnormalities. Hypertension. Hyperlipidemia. Lipid panel 08/22/2022: LDL 59, HDL 52, TG 81, total 127. OSA. GERD. T2DM. PE.  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  In July 2020 she underwent screening colonoscopy and was noted to be in new onset A-fib with slow ventricular response with rates in the 50s to 60s.  She was asymptomatic.  Echo August 2020 demonstrated EF 50 to 55%, mild to moderately dilated LV with severe asymmetric LVH of the apical wall consistent with apical hypertrophic cardiomyopathy,  low normal RV function, mild RVH, mild BAE, mild to moderate MR.  Cardiac MRI September 2020 confirmed apical hypertrophic cardiomyopathy as detailed above.  She underwent DCCV in December 2020.  Upon arrival for exercise stress test the following week she was found to be back in A-fib.  Upon follow-up in October 2022 patient was noted to be in a flutter with slow ventricular response/junctional rhythm.  She was referred to EP for consideration of a permanent pacemaker.  She was evaluated by Dr. Lalla Brothers on 06/15/2021.  7-day ZIO demonstrated 100% flutter and several pauses as detailed above.  She followed up in December 2022 with continued complaints of fatigue with exertion and exertional lightheadedness/dizziness without syncope.  She underwent PPM placement in January 2023.  In January 2024 amlodipine was added to her regimen for persistently elevated BP and subsequently increased to 5 mg daily.  She was seen for EP follow-up in April 2024 and noted increased lower extremity edema prompting reduction of amlodipine back to 2.5 mg and carvedilol 3.125 twice daily was added.  Patient was last seen in the office by Dr. Okey Dupre on 02/21/2023 for routine follow-up.  Patient reported she was unable to tolerate carvedilol secondary to fatigue.  She stopped it in June with resolution of symptoms.  She reported stable mild exertional dyspnea and occasional brief orthostatic lightheadedness.  She reported well-controlled BP at home.  Pressure was slightly elevated at the time of her office visit.  No other changes were made.     History of Present Illness    Today, patient is here alone. She is doing well. Patient denies shortness of breath, lower extremity  edema, orthopnea or PND. She reports stable DOE with heavier exertion "it depends what I am doing and for how long if I get winded." This has been unchanged for years.  No chest pain, pressure, or tightness. No palpitations. She does not follow a regular exercise  program but does some walking and housework. Her INR is well controlled. She denies blood in stool or urine.     ROS: All other systems reviewed and are otherwise negative except as noted in History of Present Illness.  EKGs/Labs Reviewed       EKG not ordered today.   Risk Assessment/Calculations     CHA2DS2-VASc Score = 5   This indicates a 7.2% annual risk of stroke. The patient's score is based upon: CHF History: 1 HTN History: 1 Diabetes History: 0 Stroke History: 0 Vascular Disease History: 0 Age Score: 2 Gender Score: 1             Physical Exam    VS:  BP 120/70   Pulse 90   Ht 5\' 4"  (1.626 m)   Wt 154 lb (69.9 kg)   SpO2 92%   BMI 26.43 kg/m  , BMI Body mass index is 26.43 kg/m.  GEN: Well nourished, well developed, in no acute distress. Neck: No JVD or carotid bruits. Cardiac:  RRR. No murmurs. No rubs or gallops.   Respiratory:  Respirations regular and unlabored. Clear to auscultation without rales, wheezing or rhonchi. GI: Soft, nontender, nondistended. Extremities: Radials/DP/PT 2+ and equal bilaterally. No clubbing or cyanosis. No edema.  Skin: Warm and dry, no rash. Neuro: Strength intact.  Assessment & Plan   Permanent A-fib/a-flutter Onset July 2020.  Denies spontaneous bleeding concerns.  Patient denies palpitations or cardiac awareness of arrhythmia. INR is followed by her PCP and has been stable.  -Continue Coumadin managed by PCP. -Continue to follow with EP.  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  Remote device check January 2025 showed normal device function with stable lead parameters.  Patient denies lightheadedness, dizziness, presyncope or syncope.  -Continue remote device checks and follow-up with EP.  Apical variant hypertrophic cardiomyopathy Cardiac MRI September 2020 demonstrated hypertrophy of LV apex consistent with apical hypertrophic cardiomyopathy, LGE inferior RV insertion site and patchy LGE at the  apex consistent with apical HCM, normal LV/RV size and function.  Echo July 2023 showed EF 55 to 60%, no RWMA, normal diastolic parameters, normal RV size/function, no significant valvular abnormalities.  Patient has a history of intolerance to beta-blocker secondary to fatigue.  She reports stable DOE. No lower extremity edema.  Euvolemic and well compensated on exam. -Defer addition of beta-blocker.  Hypertension BP today 120/70. No lightheadedness or headache reported today.  -Continue amlodipine, olmesartan.  Hyperlipidemia LDL January 2024 59, at goal. -Continue atorvastatin. -Continue to follow with PCP.  Disposition: Return in 6 months or sooner as needed.          Signed, Etta Grandchild. Edson Deridder, DNP, NP-C

## 2023-10-26 ENCOUNTER — Encounter: Payer: Self-pay | Admitting: Student

## 2023-10-26 ENCOUNTER — Ambulatory Visit: Payer: Medicare Other | Attending: Student | Admitting: Student

## 2023-10-26 VITALS — BP 120/70 | HR 90 | Ht 64.0 in | Wt 154.0 lb

## 2023-10-26 DIAGNOSIS — E785 Hyperlipidemia, unspecified: Secondary | ICD-10-CM

## 2023-10-26 DIAGNOSIS — I4821 Permanent atrial fibrillation: Secondary | ICD-10-CM | POA: Diagnosis not present

## 2023-10-26 DIAGNOSIS — I422 Other hypertrophic cardiomyopathy: Secondary | ICD-10-CM | POA: Diagnosis not present

## 2023-10-26 DIAGNOSIS — E1169 Type 2 diabetes mellitus with other specified complication: Secondary | ICD-10-CM

## 2023-10-26 DIAGNOSIS — I10 Essential (primary) hypertension: Secondary | ICD-10-CM

## 2023-10-26 DIAGNOSIS — R001 Bradycardia, unspecified: Secondary | ICD-10-CM | POA: Diagnosis not present

## 2023-10-26 NOTE — Patient Instructions (Signed)
 Medication Instructions:  Your Physician recommend you continue on your current medication as directed.    *If you need a refill on your cardiac medications before your next appointment, please call your pharmacy*   Lab Work: None ordered at this time    Follow-Up: At [facility], you and your health needs are our priority.  As part of our continuing mission to provide you with exceptional heart care, we have created designated Provider Care Teams.  These Care Teams include your primary Cardiologist (physician) and Advanced Practice Providers (APPs -  Physician Assistants and Nurse Practitioners) who all work together to provide you with the care you need, when you need it.   Your next appointment:   6 month(s)  Provider:   You may see Yvonne Kendall, MD or Carlos Levering, NP

## 2023-10-27 ENCOUNTER — Other Ambulatory Visit: Payer: Self-pay | Admitting: Internal Medicine

## 2023-11-13 ENCOUNTER — Other Ambulatory Visit: Payer: Self-pay | Admitting: Internal Medicine

## 2023-11-28 ENCOUNTER — Ambulatory Visit: Payer: Medicare Other | Admitting: Nurse Practitioner

## 2023-11-28 ENCOUNTER — Encounter: Payer: Self-pay | Admitting: Nurse Practitioner

## 2023-11-28 VITALS — BP 118/76 | HR 75 | Temp 98.1°F | Ht 64.0 in | Wt 153.2 lb

## 2023-11-28 DIAGNOSIS — G4733 Obstructive sleep apnea (adult) (pediatric): Secondary | ICD-10-CM | POA: Diagnosis not present

## 2023-11-28 NOTE — Progress Notes (Unsigned)
 @Patient  ID: Sarah Sweeney, female    DOB: 1940-10-02, 83 y.o.   MRN: [id number]  Chief Complaint  Patient presents with   Follow-up    CPAP mask not comfortable/ feels like mask is "sticking to her skin around the nose".  Patient states this does not affected her sleep quality.     Referring provider: Eartha Gold, MD  HPI: 83 year old female, never smoker followed for OSA on CPAP. She is a former patient of Dr. Carlyle Childes and last seen in office 11/27/2021. Past medical history significant for cardiomyopathy, symptomatic bradycardia s/p pacemaker, HTN, PAF, hx of PE on coumadin, GERD, DM, HLD.  TEST/EVENTS:  10/18/2015 CT chest: LLL nodule stable since 2014 11/04/2019 HST: AHI 35.7, SpO2 low 81%  11/28/2022: OV with Dr. Matilde Son. Doing well with CPAP. Nasal pillow mask. No issues with mask or fit. No sinus congestion. Has some mild mouth dryness. Rested during the day.   11/28/2023: Today - follow up Discussed the use of AI scribe software for clinical note transcription with the patient, who gave verbal consent to proceed.  History of Present Illness   Sarah Sweeney is an 83 year old female with severe sleep apnea who presents for follow up.  She has been on CPAP therapy using a nasal pillow mask and is experiencing difficulties with the mask, including high leaks. No increased daytime tiredness unless she has night where she doesn't sleep well, which is not often. She wears her CPAP nightly. She does feel she sleeps better with it than without it. Energy levels are good. No drowsy driving or morning headaches.   Her CPAP machine is approximately four years old, and she last changed the mask cushions and frame about a month ago. She started noticing more leaks this year without any changes to her setup or weight. The leaks do not typically wake her up at night, but she struggles with the mask when initially putting it on. If she gets up during the night, she often does not adjust the  mask upon returning to bed due to the issues with fit/leaks.    10/29/2023-11/27/2023 CPAP 5-15 cmH2O 30/30 days; 100% >4 hr; average use 6 hr 40 min Pressure 95th 11.1 Leaks 95th 45.8 AHI 9.7  No Known Allergies  Immunization History  Administered Date(s) Administered   Fluad Quad(high Dose 65+) 05/18/2021   Influenza Split 05/21/2015   Influenza,inj,Quad PF,6+ Mos 04/30/2019, 05/04/2020   Influenza-Unspecified 05/15/2014, 05/05/2016, 05/04/2017, 05/17/2018, 04/30/2019, 05/27/2022   Moderna SARS-COV2 Booster Vaccination 06/17/2020   Moderna Sars-Covid-2 Vaccination 08/27/2019, 09/24/2019   Pneumococcal Conjugate-13 10/29/2014   Pneumococcal Polysaccharide-23 06/19/2012   Tdap 04/27/2014   Zoster Recombinant(Shingrix) 06/08/2018, 08/12/2018   Zoster, Live 06/16/2013    Past Medical History:  Diagnosis Date   Allergy    Apical variant hypertrophic cardiomyopathy (HCC)    Atrial fibrillation and flutter (HCC)    Diabetes mellitus without complication (HCC)    Erythrocytosis 03/02/2015   GERD (gastroesophageal reflux disease)    Headache    Hypertension    Pulmonary embolism (HCC) 2013   Coumadin Therapy   Sleep apnea    CPAP   Symptomatic bradycardia    s/p single-chamber pacemaker (08/2021 - Dr. Marven Slimmer)    Tobacco History: Social History   Tobacco Use  Smoking Status Never  Smokeless Tobacco Never   Counseling given: Not Answered   Outpatient Medications Prior to Visit  Medication Sig Dispense Refill   acetaminophen (TYLENOL) 325 MG tablet Take  1-2 tablets (325-650 mg total) by mouth every 4 (four) hours as needed for mild pain.     amLODipine (NORVASC) 2.5 MG tablet TAKE ONE TABLET BY MOUTH ONCE DAILY 90 tablet 3   atorvastatin (LIPITOR) 20 MG tablet TAKE 1 TABLET BY MOUTH DAILY 90 tablet 3   cyanocobalamin (VITAMIN B12) 1000 MCG tablet Take 1,000 mcg by mouth daily.     diclofenac Sodium (VOLTAREN) 1 % GEL Apply 1 application topically 2 (two) times daily  as needed (pain).     latanoprost (XALATAN) 0.005 % ophthalmic solution Place 1 drop into both eyes at bedtime.     metFORMIN (GLUCOPHAGE-XR) 500 MG 24 hr tablet Take 500 mg by mouth daily with breakfast.     olmesartan (BENICAR) 40 MG tablet TAKE ONE TABLET (40 MG) BY MOUTH EVERY DAY 30 tablet 3   omeprazole (PRILOSEC) 40 MG capsule Take 40 mg by mouth daily.      oxybutynin (DITROPAN XL) 15 MG 24 hr tablet Take 15 mg by mouth at bedtime.      Vitamin D, Ergocalciferol, (DRISDOL) 1.25 MG (50000 UNIT) CAPS capsule Take 50,000 Units by mouth once a week.     warfarin (COUMADIN) 3 MG tablet Take 3 mg by mouth daily. Taking 2 mg Mon, Wed, Fri and 3 mg on Sun, Tu, Th, Sat     No facility-administered medications prior to visit.     Review of Systems:   Constitutional: No weight loss or gain, night sweats, fevers, chills, fatigue, or lassitude. HEENT: No headaches, difficulty swallowing, tooth/dental problems, or sore throat. No sneezing, itching, ear ache, nasal congestion, or post nasal drip CV:  No chest pain, orthopnea, PND, swelling in lower extremities, anasarca, dizziness, palpitations, syncope Resp: No shortness of breath with exertion or at rest. No excess mucus or change in color of mucus. No productive or non-productive. No hemoptysis. No wheezing.   GI:  No heartburn, indigestion GU: No nocturia Skin: No rash, lesions, ulcerations MSK:  No joint pain or swelling.   Neuro: No dizziness or lightheadedness.  Psych: No depression or anxiety. Mood stable. +sleep disturbance    Physical Exam:  BP 118/76 (BP Location: Right Arm, Patient Position: Sitting, Cuff Size: Normal)   Pulse 75   Temp 98.1 F (36.7 C) (Oral)   Ht 5\' 4"  (1.626 m)   Wt 153 lb 3.2 oz (69.5 kg)   SpO2 97%   BMI 26.30 kg/m   GEN: Pleasant, interactive, well-appearing; in no acute distress HEENT:  Normocephalic and atraumatic. PERRLA. Sclera white. Nasal turbinates pink, moist and patent bilaterally. No  rhinorrhea present. Oropharynx pink and moist, without exudate or edema. No lesions, ulcerations, or postnasal drip. Mallampati III NECK:  Supple w/ fair ROM. No JVD present. Normal carotid impulses w/o bruits. Thyroid symmetrical with no goiter or nodules palpated. No lymphadenopathy.   CV: RRR, no m/r/g, no peripheral edema. Pulses intact, +2 bilaterally. No cyanosis, pallor or clubbing. PULMONARY:  Unlabored, regular breathing. Clear bilaterally A&P w/o wheezes/rales/rhonchi. No accessory muscle use.  GI: BS present and normoactive. Soft, non-tender to palpation. No organomegaly or masses detected. MSK: No erythema, warmth or tenderness. Cap refil <2 sec all extrem.  Neuro: A/Ox3. No focal deficits noted.   Skin: Warm, no lesions or rashe Psych: Normal affect and behavior. Judgement and thought content appropriate.     Lab Results:  CBC    Component Value Date/Time   WBC 6.0 02/15/2022 0405   RBC 5.04 02/15/2022 0405  HGB 14.6 02/15/2022 0405   HGB 14.1 05/18/2021 1139   HCT 45.6 02/15/2022 0405   HCT 43.0 05/18/2021 1139   PLT 197 02/15/2022 0405   PLT 208 05/18/2021 1139   MCV 90.5 02/15/2022 0405   MCV 84 05/18/2021 1139   MCV 80 11/10/2014 1348   MCH 29.0 02/15/2022 0405   MCHC 32.0 02/15/2022 0405   RDW 13.3 02/15/2022 0405   RDW 13.3 05/18/2021 1139   RDW 15.2 (H) 11/10/2014 1348   LYMPHSABS 1.4 02/14/2022 1419   LYMPHSABS 1.6 06/05/2019 1118   LYMPHSABS 1.6 11/10/2014 1348   MONOABS 0.6 02/14/2022 1419   MONOABS 0.6 11/10/2014 1348   EOSABS 0.2 02/14/2022 1419   EOSABS 0.2 06/05/2019 1118   EOSABS 0.3 11/10/2014 1348   BASOSABS 0.1 02/14/2022 1419   BASOSABS 0.1 06/05/2019 1118   BASOSABS 0.1 11/10/2014 1348    BMET    Component Value Date/Time   NA 143 05/24/2022 0932   NA 142 06/05/2019 1118   NA 144 05/06/2012 0337   K 4.0 05/24/2022 0932   K 4.3 05/06/2012 0337   CL 107 05/24/2022 0932   CL 111 (H) 05/06/2012 0337   CO2 28 05/24/2022 0932   CO2  25 05/06/2012 0337   GLUCOSE 125 (H) 05/24/2022 0932   GLUCOSE 122 (H) 05/06/2012 0337   BUN 18 05/24/2022 0932   BUN 18 06/05/2019 1118   BUN 18 05/06/2012 0337   CREATININE 0.96 05/24/2022 0932   CREATININE 0.94 11/10/2014 1348   CALCIUM 9.0 05/24/2022 0932   CALCIUM 8.3 (L) 05/06/2012 0337   GFRNONAA 60 (L) 05/24/2022 0932   GFRNONAA >60 11/10/2014 1348   GFRAA >60 07/14/2019 1320   GFRAA >60 11/10/2014 1348    BNP    Component Value Date/Time   BNP 271.2 (H) 02/14/2022 1419     Imaging:  No results found.  Administration History     None           No data to display          No results found for: "NITRICOXIDE"      Assessment & Plan:   Severe obstructive sleep apnea Severe OSA with increased residual AHI and large leaks. Suspect this may be due to mask fit and higher pressures. Will adjust her settings down to 5-10 cmH2O. Tried her on two different nasal cradle mask options in office. She may need a small, which we did not have, but fitted her for medium with small headgear in the N30i and a medium nasal pillow mask. She will notify us if she prefers one of these over the current. May need to consider mask fitting or titration if problems persist. Aware of risks of untreated OSA. Understands proper use/care of device. Safe driving practices reviewed. Excellent compliance. Receives benefit from use.   Patient Instructions  Continue to use CPAP every night, minimum of 4-6 hours a night.  Change equipment as directed. Wash your tubing with warm soap and water daily, hang to dry. Wash humidifier portion weekly. Use bottled, distilled water and change daily Be aware of reduced alertness and do not drive or operate heavy machinery if experiencing this or drowsiness.  Exercise encouraged, as tolerated. Healthy weight management discussed.  Avoid or decrease alcohol consumption and medications that make you more sleepy, if possible. Notify if persistent daytime  sleepiness occurs even with consistent use of PAP therapy.  Adjust CPAP settings down to 5-10 cmH2O Try the new nasal pillow mask in  size small or medium and then try the other nasal cradle mask in size small/wide with small headgear and see which works best for you  We can set you up with a mask fitting, if needed   Follow up in 6 weeks with Katie Armani Brar,NP. If symptoms do not improve or worsen, please contact office for sooner follow up    Advised if symptoms do not improve or worsen, to please contact office for sooner follow up or seek emergency care.   I spent 35 minutes of dedicated to the care of this patient on the date of this encounter to include pre-visit review of records, face-to-face time with the patient discussing conditions above, post visit ordering of testing, clinical documentation with the electronic health record, making appropriate referrals as documented, and communicating necessary findings to members of the patients care team.  Roetta Clarke, NP 11/29/2023  Pt aware and understands NP's role.

## 2023-11-28 NOTE — Patient Instructions (Signed)
 Continue to use CPAP every night, minimum of 4-6 hours a night.  Change equipment as directed. Wash your tubing with warm soap and water daily, hang to dry. Wash humidifier portion weekly. Use bottled, distilled water and change daily Be aware of reduced alertness and do not drive or operate heavy machinery if experiencing this or drowsiness.  Exercise encouraged, as tolerated. Healthy weight management discussed.  Avoid or decrease alcohol consumption and medications that make you more sleepy, if possible. Notify if persistent daytime sleepiness occurs even with consistent use of PAP therapy.  Adjust CPAP settings down to 5-10 cmH2O Try the new nasal pillow mask in size small or medium and then try the other nasal cradle mask in size small/wide with small headgear and see which works best for you  We can set you up with a mask fitting, if needed   Follow up in 6 weeks with Katie Kella Splinter,NP. If symptoms do not improve or worsen, please contact office for sooner follow up

## 2023-11-29 ENCOUNTER — Ambulatory Visit: Payer: Medicare Other | Admitting: Internal Medicine

## 2023-11-29 ENCOUNTER — Encounter: Payer: Self-pay | Admitting: Nurse Practitioner

## 2023-11-29 NOTE — Assessment & Plan Note (Signed)
 Severe OSA with increased residual AHI and large leaks. Suspect this may be due to mask fit and higher pressures. Will adjust her settings down to 5-10 cmH2O. Tried her on two different nasal cradle mask options in office. She may need a small, which we did not have, but fitted her for medium with small headgear in the N30i and a medium nasal pillow mask. She will notify us  if she prefers one of these over the current. May need to consider mask fitting or titration if problems persist. Aware of risks of untreated OSA. Understands proper use/care of device. Safe driving practices reviewed. Excellent compliance. Receives benefit from use.   Patient Instructions  Continue to use CPAP every night, minimum of 4-6 hours a night.  Change equipment as directed. Wash your tubing with warm soap and water daily, hang to dry. Wash humidifier portion weekly. Use bottled, distilled water and change daily Be aware of reduced alertness and do not drive or operate heavy machinery if experiencing this or drowsiness.  Exercise encouraged, as tolerated. Healthy weight management discussed.  Avoid or decrease alcohol consumption and medications that make you more sleepy, if possible. Notify if persistent daytime sleepiness occurs even with consistent use of PAP therapy.  Adjust CPAP settings down to 5-10 cmH2O Try the new nasal pillow mask in size small or medium and then try the other nasal cradle mask in size small/wide with small headgear and see which works best for you  We can set you up with a mask fitting, if needed   Follow up in 6 weeks with Katie Cortez Steelman,NP. If symptoms do not improve or worsen, please contact office for sooner follow up

## 2023-12-07 ENCOUNTER — Ambulatory Visit (INDEPENDENT_AMBULATORY_CARE_PROVIDER_SITE_OTHER): Payer: Medicare Other

## 2023-12-07 DIAGNOSIS — R001 Bradycardia, unspecified: Secondary | ICD-10-CM | POA: Diagnosis not present

## 2023-12-07 LAB — CUP PACEART REMOTE DEVICE CHECK
Battery Remaining Longevity: 127 mo
Battery Voltage: 3 V
Brady Statistic RV Percent Paced: 99.92 %
Date Time Interrogation Session: 20250425084543
Implantable Lead Connection Status: 753985
Implantable Lead Implant Date: 20230125
Implantable Lead Location: 753860
Implantable Lead Model: 3830
Implantable Pulse Generator Implant Date: 20230125
Lead Channel Impedance Value: 361 Ohm
Lead Channel Impedance Value: 494 Ohm
Lead Channel Pacing Threshold Amplitude: 0.625 V
Lead Channel Pacing Threshold Pulse Width: 0.4 ms
Lead Channel Sensing Intrinsic Amplitude: 8.125 mV
Lead Channel Sensing Intrinsic Amplitude: 8.125 mV
Lead Channel Setting Pacing Amplitude: 2 V
Lead Channel Setting Pacing Pulse Width: 0.4 ms
Lead Channel Setting Sensing Sensitivity: 1.2 mV
Zone Setting Status: 755011

## 2023-12-08 ENCOUNTER — Encounter: Payer: Self-pay | Admitting: Cardiology

## 2023-12-13 ENCOUNTER — Other Ambulatory Visit: Payer: Self-pay | Admitting: Internal Medicine

## 2024-01-14 NOTE — Progress Notes (Signed)
 Remote pacemaker transmission.

## 2024-02-11 ENCOUNTER — Encounter: Payer: Self-pay | Admitting: Nurse Practitioner

## 2024-02-11 ENCOUNTER — Ambulatory Visit: Admitting: Nurse Practitioner

## 2024-02-11 VITALS — BP 110/70 | HR 74 | Temp 97.1°F | Ht 64.0 in | Wt 150.8 lb

## 2024-02-11 DIAGNOSIS — G4733 Obstructive sleep apnea (adult) (pediatric): Secondary | ICD-10-CM | POA: Diagnosis not present

## 2024-02-11 NOTE — Patient Instructions (Addendum)
 Continue to use CPAP every night, minimum of 4-6 hours a night.  Change equipment as directed. Wash your tubing with warm soap and water daily, hang to dry. Wash humidifier portion weekly. Use bottled, distilled water and change daily Be aware of reduced alertness and do not drive or operate heavy machinery if experiencing this or drowsiness.  Exercise encouraged, as tolerated. Healthy weight management discussed.  Avoid or decrease alcohol consumption and medications that make you more sleepy, if possible. Notify if persistent daytime sleepiness occurs even with consistent use of PAP therapy.  Things look good today!!  Follow up in one year with Sarah Lyndsi Altic,NP, or sooner, if needed

## 2024-02-11 NOTE — Progress Notes (Signed)
 @Patient  ID: Sarah Sweeney, female    DOB: 04-Jun-1941, 83 y.o.   MRN: [id number]  Chief Complaint  Patient presents with   Follow-up    CPAP- Mask is ok.  Pressure is okay.    Referring provider: Epifanio Alm SQUIBB, MD  HPI: 83 year old female, never smoker followed for OSA on CPAP. She is a former patient of Dr. Magdaleno and last seen in office 11/28/2023 by Malachy NP. Past medical history significant for cardiomyopathy, symptomatic bradycardia s/p pacemaker, HTN, PAF, hx of PE on coumadin , GERD, DM, HLD.  TEST/EVENTS:  10/18/2015 CT chest: LLL nodule stable since 2014 11/04/2019 HST: AHI 35.7, SpO2 low 81%  11/28/2022: OV with Dr. Shellia. Doing well with CPAP. Nasal pillow mask. No issues with mask or fit. No sinus congestion. Has some mild mouth dryness. Rested during the day.   11/28/2023: Ov with Colbin Jovel NP Discussed the use of AI scribe software for clinical note transcription with the patient, who gave verbal consent to proceed. Sarah Sweeney is an 83 year old female with severe sleep apnea who presents for follow up. She has been on CPAP therapy using a nasal pillow mask and is experiencing difficulties with the mask, including high leaks. No increased daytime tiredness unless she has night where she doesn't sleep well, which is not often. She wears her CPAP nightly. She does feel she sleeps better with it than without it. Energy levels are good. No drowsy driving or morning headaches.  Her CPAP machine is approximately four years old, and she last changed the mask cushions and frame about a month ago. She started noticing more leaks this year without any changes to her setup or weight. The leaks do not typically wake her up at night, but she struggles with the mask when initially putting it on. If she gets up during the night, she often does not adjust the mask upon returning to bed due to the issues with fit/leaks.  10/29/2023-11/27/2023 CPAP 5-15 cmH2O 30/30 days; 100% >4 hr; average use  6 hr 40 min Pressure 95th 11.1 Leaks 95th 45.8 AHI 9.7  02/11/2024: Today - follow up Patient presents today for follow up. Doing better on her CPAP since we changed her settings. She liked the nasal pillow mask as well. Needs an updated order for supplies. Sleeping well with her CPAP. Sometimes fights with it before falling asleep but otherwise, no issues. Energy levels are good. No issues with drowsy driving.  4/71/7974-3/73/7974: CPAP 5-10 cmH2O 30/30 days; 93% >4 hr; average use 5 hr 58 min Pressure 95th 9.4 Leaks 95th 38.9 AHI 3.7  No Known Allergies  Immunization History  Administered Date(s) Administered   Fluad Quad(high Dose 65+) 05/18/2021   Influenza Split 05/21/2015   Influenza,inj,Quad PF,6+ Mos 04/30/2019, 05/04/2020   Influenza-Unspecified 05/15/2014, 05/05/2016, 05/04/2017, 05/17/2018, 04/30/2019, 05/27/2022, 05/28/2023   Moderna SARS-COV2 Booster Vaccination 06/17/2020   Moderna Sars-Covid-2 Vaccination 08/27/2019, 09/24/2019   Pneumococcal Conjugate-13 10/29/2014   Pneumococcal Polysaccharide-23 06/19/2012   Tdap 04/27/2014   Zoster Recombinant(Shingrix) 06/08/2018, 08/12/2018   Zoster, Live 06/16/2013    Past Medical History:  Diagnosis Date   Allergy    Apical variant hypertrophic cardiomyopathy (HCC)    Atrial fibrillation and flutter (HCC)    Diabetes mellitus without complication (HCC)    Erythrocytosis 03/02/2015   GERD (gastroesophageal reflux disease)    Headache    Hypertension    Pulmonary embolism (HCC) 2013   Coumadin  Therapy   Sleep apnea  CPAP   Symptomatic bradycardia    s/p single-chamber pacemaker (08/2021 - Dr. Cindie)    Tobacco History: Social History   Tobacco Use  Smoking Status Never  Smokeless Tobacco Never   Counseling given: Not Answered   Outpatient Medications Prior to Visit  Medication Sig Dispense Refill   acetaminophen  (TYLENOL ) 325 MG tablet Take 1-2 tablets (325-650 mg total) by mouth every 4 (four)  hours as needed for mild pain.     alendronate (FOSAMAX) 70 MG tablet Take 70 mg by mouth once a week.     amLODipine  (NORVASC ) 2.5 MG tablet TAKE ONE TABLET BY MOUTH ONCE DAILY 90 tablet 3   atorvastatin  (LIPITOR) 20 MG tablet TAKE 1 TABLET BY MOUTH DAILY 90 tablet 3   diclofenac  Sodium (VOLTAREN ) 1 % GEL Apply 1 application topically 2 (two) times daily as needed (pain).     latanoprost (XALATAN) 0.005 % ophthalmic solution Place 1 drop into both eyes at bedtime.     olmesartan  (BENICAR ) 40 MG tablet TAKE ONE TABLET (40 MG) BY MOUTH ONCE DAILY 30 tablet 3   omeprazole (PRILOSEC) 40 MG capsule Take 40 mg by mouth daily.      oxybutynin  (DITROPAN  XL) 15 MG 24 hr tablet Take 15 mg by mouth at bedtime.      Vitamin D, Ergocalciferol, (DRISDOL) 1.25 MG (50000 UNIT) CAPS capsule Take 50,000 Units by mouth once a week.     warfarin (COUMADIN ) 3 MG tablet Take 3 mg by mouth daily. Taking 2 mg Mon, Wed, Fri and 3 mg on Sun, Tu, Th, Sat     metFORMIN (GLUCOPHAGE-XR) 500 MG 24 hr tablet Take 500 mg by mouth daily with breakfast. (Patient not taking: Reported on 02/11/2024)     No facility-administered medications prior to visit.     Review of Systems:   Constitutional: No weight loss or gain, night sweats, fevers, chills, fatigue, or lassitude. HEENT: No headaches, difficulty swallowing, tooth/dental problems, or sore throat. No sneezing, itching, ear ache, nasal congestion, or post nasal drip CV:  No chest pain, orthopnea, PND, swelling in lower extremities, anasarca, dizziness, palpitations, syncope Resp: No shortness of breath with exertion or at rest. No cough GI:  No heartburn, indigestion GU: No nocturia Skin: No rash, lesions, ulcerations MSK:  No joint pain or swelling.   Neuro: No dizziness or lightheadedness.  Psych: No depression or anxiety. Mood stable.     Physical Exam:  BP 110/70 (BP Location: Right Arm, Cuff Size: Normal)   Pulse 74   Temp (!) 97.1 F (36.2 C)   Ht 5' 4  (1.626 m)   Wt 150 lb 12.8 oz (68.4 kg)   SpO2 97%   BMI 25.88 kg/m   GEN: Pleasant, interactive, well-appearing; in no acute distress HEENT:  Normocephalic and atraumatic. PERRLA. Sclera white. Nasal turbinates pink, moist and patent bilaterally. No rhinorrhea present. Oropharynx pink and moist, without exudate or edema. No lesions, ulcerations, or postnasal drip. Mallampati III NECK:  Supple w/ fair ROM.  No lymphadenopathy.   CV: RRR, no m/r/g, no peripheral edema. Pulses intact, +2 bilaterally. No cyanosis, pallor or clubbing. PULMONARY:  Unlabored, regular breathing. Clear bilaterally A&P w/o wheezes/rales/rhonchi. No accessory muscle use.  GI: BS present and normoactive. Soft, non-tender to palpation. No organomegaly or masses detected. MSK: No erythema, warmth or tenderness. Cap refil <2 sec all extrem.  Neuro: A/Ox3. No focal deficits noted.   Skin: Warm, no lesions or rashe Psych: Normal affect and behavior. Judgement and  thought content appropriate.     Lab Results:  CBC    Component Value Date/Time   WBC 6.0 02/15/2022 0405   RBC 5.04 02/15/2022 0405   HGB 14.6 02/15/2022 0405   HGB 14.1 05/18/2021 1139   HCT 45.6 02/15/2022 0405   HCT 43.0 05/18/2021 1139   PLT 197 02/15/2022 0405   PLT 208 05/18/2021 1139   MCV 90.5 02/15/2022 0405   MCV 84 05/18/2021 1139   MCV 80 11/10/2014 1348   MCH 29.0 02/15/2022 0405   MCHC 32.0 02/15/2022 0405   RDW 13.3 02/15/2022 0405   RDW 13.3 05/18/2021 1139   RDW 15.2 (H) 11/10/2014 1348   LYMPHSABS 1.4 02/14/2022 1419   LYMPHSABS 1.6 06/05/2019 1118   LYMPHSABS 1.6 11/10/2014 1348   MONOABS 0.6 02/14/2022 1419   MONOABS 0.6 11/10/2014 1348   EOSABS 0.2 02/14/2022 1419   EOSABS 0.2 06/05/2019 1118   EOSABS 0.3 11/10/2014 1348   BASOSABS 0.1 02/14/2022 1419   BASOSABS 0.1 06/05/2019 1118   BASOSABS 0.1 11/10/2014 1348    BMET    Component Value Date/Time   NA 143 05/24/2022 0932   NA 142 06/05/2019 1118   NA 144  05/06/2012 0337   K 4.0 05/24/2022 0932   K 4.3 05/06/2012 0337   CL 107 05/24/2022 0932   CL 111 (H) 05/06/2012 0337   CO2 28 05/24/2022 0932   CO2 25 05/06/2012 0337   GLUCOSE 125 (H) 05/24/2022 0932   GLUCOSE 122 (H) 05/06/2012 0337   BUN 18 05/24/2022 0932   BUN 18 06/05/2019 1118   BUN 18 05/06/2012 0337   CREATININE 0.96 05/24/2022 0932   CREATININE 0.94 11/10/2014 1348   CALCIUM  9.0 05/24/2022 0932   CALCIUM  8.3 (L) 05/06/2012 0337   GFRNONAA 60 (L) 05/24/2022 0932   GFRNONAA >60 11/10/2014 1348   GFRAA >60 07/14/2019 1320   GFRAA >60 11/10/2014 1348    BNP    Component Value Date/Time   BNP 271.2 (H) 02/14/2022 1419     Imaging:  No results found.  Administration History     None           No data to display          No results found for: NITRICOXIDE      Assessment & Plan:   Severe obstructive sleep apnea Severe OSA with improved leaks and AHI with pressure changes and mask change. Orders placed to update supplies. Encouraged to continue using therapy nightly. Aware of proper care/use of device. Understands risks of untreated OSA. Safe driving practices reviewed.   Patient Instructions  Continue to use CPAP every night, minimum of 4-6 hours a night.  Change equipment as directed. Wash your tubing with warm soap and water daily, hang to dry. Wash humidifier portion weekly. Use bottled, distilled water and change daily Be aware of reduced alertness and do not drive or operate heavy machinery if experiencing this or drowsiness.  Exercise encouraged, as tolerated. Healthy weight management discussed.  Avoid or decrease alcohol consumption and medications that make you more sleepy, if possible. Notify if persistent daytime sleepiness occurs even with consistent use of PAP therapy.  Things look good today!!  Follow up in one year with Katie Jackalynn Art,NP, or sooner, if needed    Advised if symptoms do not improve or worsen, to please contact  office for sooner follow up or seek emergency care.   I spent 25 minutes of dedicated to the care of this patient on  the date of this encounter to include pre-visit review of records, face-to-face time with the patient discussing conditions above, post visit ordering of testing, clinical documentation with the electronic health record, making appropriate referrals as documented, and communicating necessary findings to members of the patients care team.  Comer LULLA Rouleau, NP 02/11/2024  Pt aware and understands NP's role.

## 2024-02-11 NOTE — Assessment & Plan Note (Signed)
 Severe OSA with improved leaks and AHI with pressure changes and mask change. Orders placed to update supplies. Encouraged to continue using therapy nightly. Aware of proper care/use of device. Understands risks of untreated OSA. Safe driving practices reviewed.   Patient Instructions  Continue to use CPAP every night, minimum of 4-6 hours a night.  Change equipment as directed. Wash your tubing with warm soap and water daily, hang to dry. Wash humidifier portion weekly. Use bottled, distilled water and change daily Be aware of reduced alertness and do not drive or operate heavy machinery if experiencing this or drowsiness.  Exercise encouraged, as tolerated. Healthy weight management discussed.  Avoid or decrease alcohol consumption and medications that make you more sleepy, if possible. Notify if persistent daytime sleepiness occurs even with consistent use of PAP therapy.  Things look good today!!  Follow up in one year with Katie Shanequa Whitenight,NP, or sooner, if needed

## 2024-03-07 ENCOUNTER — Ambulatory Visit: Payer: Medicare Other

## 2024-03-07 DIAGNOSIS — R001 Bradycardia, unspecified: Secondary | ICD-10-CM

## 2024-03-07 LAB — CUP PACEART REMOTE DEVICE CHECK
Battery Remaining Longevity: 126 mo
Battery Voltage: 3 V
Brady Statistic RV Percent Paced: 99.96 %
Date Time Interrogation Session: 20250725024038
Implantable Lead Connection Status: 753985
Implantable Lead Implant Date: 20230125
Implantable Lead Location: 753860
Implantable Lead Model: 3830
Implantable Pulse Generator Implant Date: 20230125
Lead Channel Impedance Value: 399 Ohm
Lead Channel Impedance Value: 532 Ohm
Lead Channel Pacing Threshold Amplitude: 0.625 V
Lead Channel Pacing Threshold Pulse Width: 0.4 ms
Lead Channel Sensing Intrinsic Amplitude: 25.875 mV
Lead Channel Sensing Intrinsic Amplitude: 25.875 mV
Lead Channel Setting Pacing Amplitude: 2 V
Lead Channel Setting Pacing Pulse Width: 0.4 ms
Lead Channel Setting Sensing Sensitivity: 1.2 mV
Zone Setting Status: 755011

## 2024-03-17 ENCOUNTER — Ambulatory Visit: Payer: Self-pay | Admitting: Cardiology

## 2024-04-11 ENCOUNTER — Other Ambulatory Visit: Payer: Self-pay | Admitting: Internal Medicine

## 2024-04-25 ENCOUNTER — Ambulatory Visit: Attending: Internal Medicine | Admitting: Internal Medicine

## 2024-04-25 ENCOUNTER — Encounter: Payer: Self-pay | Admitting: Internal Medicine

## 2024-04-25 VITALS — BP 120/70 | HR 86 | Ht 64.0 in | Wt 151.2 lb

## 2024-04-25 DIAGNOSIS — I4821 Permanent atrial fibrillation: Secondary | ICD-10-CM

## 2024-04-25 DIAGNOSIS — I10 Essential (primary) hypertension: Secondary | ICD-10-CM | POA: Diagnosis not present

## 2024-04-25 DIAGNOSIS — I422 Other hypertrophic cardiomyopathy: Secondary | ICD-10-CM

## 2024-04-25 DIAGNOSIS — E785 Hyperlipidemia, unspecified: Secondary | ICD-10-CM

## 2024-04-25 DIAGNOSIS — R001 Bradycardia, unspecified: Secondary | ICD-10-CM | POA: Diagnosis not present

## 2024-04-25 DIAGNOSIS — E1169 Type 2 diabetes mellitus with other specified complication: Secondary | ICD-10-CM

## 2024-04-25 DIAGNOSIS — I4819 Other persistent atrial fibrillation: Secondary | ICD-10-CM

## 2024-04-25 NOTE — Progress Notes (Unsigned)
 Cardiology Office Note:  .   Date:  04/25/2024  ID:  Sarah Sweeney, DOB 1940-12-17, MRN [id number] PCP: Sarah Alm SQUIBB, MD  [facility] Providers Cardiologist:  Sarah Hanson, MD Electrophysiologist:  Sarah ONEIDA HOLTS, MD { Click to update primary MD,subspecialty MD or APP then REFRESH:1}    History of Present Illness: .   Sarah Sweeney is a 83 y.o. 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  I last saw her a year ago, at which time she was feeling well though she noted that she did not tolerate carvedilol  due to fatigue.  We did not make any medication changes or pursue additional testing.  She was seen by Sarah Hila, NP, in March, at which time she reported stable dyspnea on exertion with moderate activity.  Today, [name] reports that she has been feeling fairly well.  She reports a single episode of transient tingling in the chest that occurred about 2 weeks ago.  It only lasted for a second or 2.  There were no associated symptoms.  She denies palpitations, lightheadedness, syncope, and falls.  She feels like she gets a little bit more tired and out of breath than she used to, though this has been gradually progressing for years.  She denies bleeding and continues to follow with Dr. Epifanio for management of her INR.  She has occasional swelling in her hands but otherwise has not had any obvious edema.  She notes that she has felt a little depressed lately.  Recent dental issues and cost associated with dental implants have been weighing on her.  ROS: See HPI  Studies Reviewed: SABRA   EKG Interpretation Date/Time:  Friday April 25 2024  10:04:28 EDT Ventricular Rate:  86 PR Interval:  250 QRS Duration:  128 QT Interval:  392 QTC Calculation: 469 R Axis:   -36  Text Interpretation: VENTRICULAR PACED RHYTHM When compared with ECG of 21-Feb-2023 16:11, Vent. rate has increased BY  16 BPM Confirmed by Arby Dahir [phone]) on 04/25/2024 10:17:21 AM    TTE (07/21/2021): Mildly dilated left ventricle with moderate left ventricular hypertrophy. LVEF 60-65% with no wall motion abnormalities. Indeterminate diastolic parameters. Normal RV size and function with moderate pulmonary hypertension (RVSP 46 mmHg). Moderate left atrial enlargement. No pericardial effusion. Moderate mitral regurgitation. Mild tricuspid regurgitation. Normal aortic valve. Normal CVP.   Risk Assessment/Calculations:    CHA2DS2-VASc Score = 7  {Confirm score is correct.  If not, click here to update score.  REFRESH note.  :1} This indicates a 11.2% annual risk of stroke. The patient's score is based upon: CHF History: 1 HTN History: 1 Diabetes History: 1 Stroke History: 0 Vascular Disease History: 1 Age Score: 2 Gender Score: 1   {This patient has a significant risk of stroke if diagnosed with atrial fibrillation.  Please consider VKA or DOAC agent for anticoagulation if the bleeding risk is acceptable.   You can also use the SmartPhrase .[email or web address] for documentation.   :[id number]}         Physical Exam:   VS:  BP 120/70 (BP Location: Left Arm, Patient Position: Sitting, Cuff Size: Normal)   Pulse 86   Ht 5' 4 (1.626 m)   Wt 151 lb 4 oz (68.6 kg)  SpO2 97%   BMI 25.96 kg/m    Wt Readings from Last 3 Encounters:  04/25/24 151 lb 4 oz (68.6 kg)  02/11/24 150 lb 12.8 oz (68.4 kg)  11/28/23 153 lb 3.2 oz (69.5 kg)    General:  NAD. Neck: No JVD or HJR. Lungs: Clear to auscultation bilaterally without wheezes or crackles. Heart: Regular rate and rhythm without murmurs, rubs, or gallops. Abdomen: Soft, nontender, nondistended. Extremities:  Trace pretibial edema bilaterally.  ASSESSMENT AND PLAN: .    ***    {Are you ordering a CV Procedure (e.g. stress test, cath, DCCV, TEE, etc)?   Press F2        :[id number]}  Dispo: ***  Signed, Sarah Hanson, MD

## 2024-04-25 NOTE — Patient Instructions (Signed)

## 2024-04-26 DIAGNOSIS — R001 Bradycardia, unspecified: Secondary | ICD-10-CM | POA: Insufficient documentation

## 2024-05-15 NOTE — Progress Notes (Signed)
 Remote PPM Transmission

## 2024-06-06 ENCOUNTER — Ambulatory Visit: Payer: Medicare Other

## 2024-06-06 DIAGNOSIS — I4821 Permanent atrial fibrillation: Secondary | ICD-10-CM | POA: Diagnosis not present

## 2024-06-06 LAB — CUP PACEART REMOTE DEVICE CHECK
Battery Remaining Longevity: 121 mo
Battery Voltage: 3 V
Brady Statistic RV Percent Paced: 99.94 %
Date Time Interrogation Session: 20251024054706
Implantable Lead Connection Status: 753985
Implantable Lead Implant Date: 20230125
Implantable Lead Location: 753860
Implantable Lead Model: 3830
Implantable Pulse Generator Implant Date: 20230125
Lead Channel Impedance Value: 361 Ohm
Lead Channel Impedance Value: 494 Ohm
Lead Channel Pacing Threshold Amplitude: 0.625 V
Lead Channel Pacing Threshold Pulse Width: 0.4 ms
Lead Channel Sensing Intrinsic Amplitude: 18 mV
Lead Channel Sensing Intrinsic Amplitude: 18 mV
Lead Channel Setting Pacing Amplitude: 2 V
Lead Channel Setting Pacing Pulse Width: 0.4 ms
Lead Channel Setting Sensing Sensitivity: 1.2 mV
Zone Setting Status: 755011

## 2024-06-09 ENCOUNTER — Ambulatory Visit: Payer: Self-pay | Admitting: Cardiology

## 2024-06-10 NOTE — Progress Notes (Signed)
 Remote PPM Transmission

## 2024-06-13 ENCOUNTER — Other Ambulatory Visit: Payer: Self-pay | Admitting: Infectious Diseases

## 2024-06-13 ENCOUNTER — Ambulatory Visit
Admission: RE | Admit: 2024-06-13 | Discharge: 2024-06-13 | Disposition: A | Discharge status: Home / Self Care | Source: Ambulatory Visit | Attending: Infectious Diseases | Admitting: Infectious Diseases

## 2024-06-13 DIAGNOSIS — R1084 Generalized abdominal pain: Secondary | ICD-10-CM | POA: Insufficient documentation

## 2024-06-13 MED ORDER — IOHEXOL 300 MG/ML  SOLN
100.0000 mL | Freq: Once | INTRAMUSCULAR | Status: AC | PRN
  Administered 2024-06-13: 100 mL via INTRAVENOUS

## 2024-07-01 ENCOUNTER — Emergency Department

## 2024-07-01 ENCOUNTER — Other Ambulatory Visit: Payer: Self-pay

## 2024-07-01 ENCOUNTER — Inpatient Hospital Stay: Admit: 2024-07-01 | Discharge: 2024-07-01 | Disposition: A | Attending: Internal Medicine

## 2024-07-01 ENCOUNTER — Inpatient Hospital Stay
Admission: EM | Admit: 2024-07-01 | Discharge: 2024-07-06 | DRG: 291 | Disposition: A | Discharge status: Home / Self Care | Attending: Internal Medicine | Admitting: Internal Medicine

## 2024-07-01 DIAGNOSIS — Z6826 Body mass index (BMI) 26.0-26.9, adult: Secondary | ICD-10-CM

## 2024-07-01 DIAGNOSIS — Z86711 Personal history of pulmonary embolism: Secondary | ICD-10-CM | POA: Diagnosis not present

## 2024-07-01 DIAGNOSIS — Z7984 Long term (current) use of oral hypoglycemic drugs: Secondary | ICD-10-CM

## 2024-07-01 DIAGNOSIS — Z79899 Other long term (current) drug therapy: Secondary | ICD-10-CM | POA: Diagnosis not present

## 2024-07-01 DIAGNOSIS — R509 Fever, unspecified: Secondary | ICD-10-CM

## 2024-07-01 DIAGNOSIS — Z950 Presence of cardiac pacemaker: Secondary | ICD-10-CM | POA: Diagnosis not present

## 2024-07-01 DIAGNOSIS — Z1152 Encounter for screening for COVID-19: Secondary | ICD-10-CM | POA: Diagnosis not present

## 2024-07-01 DIAGNOSIS — N189 Chronic kidney disease, unspecified: Secondary | ICD-10-CM | POA: Diagnosis not present

## 2024-07-01 DIAGNOSIS — I4821 Permanent atrial fibrillation: Secondary | ICD-10-CM | POA: Diagnosis present

## 2024-07-01 DIAGNOSIS — Z7983 Long term (current) use of bisphosphonates: Secondary | ICD-10-CM | POA: Diagnosis not present

## 2024-07-01 DIAGNOSIS — I130 Hypertensive heart and chronic kidney disease with heart failure and stage 1 through stage 4 chronic kidney disease, or unspecified chronic kidney disease: Secondary | ICD-10-CM | POA: Diagnosis present

## 2024-07-01 DIAGNOSIS — E663 Overweight: Secondary | ICD-10-CM | POA: Diagnosis present

## 2024-07-01 DIAGNOSIS — R7989 Other specified abnormal findings of blood chemistry: Secondary | ICD-10-CM | POA: Diagnosis not present

## 2024-07-01 DIAGNOSIS — N1831 Chronic kidney disease, stage 3a: Secondary | ICD-10-CM | POA: Diagnosis present

## 2024-07-01 DIAGNOSIS — I2782 Chronic pulmonary embolism: Secondary | ICD-10-CM | POA: Diagnosis present

## 2024-07-01 DIAGNOSIS — I5031 Acute diastolic (congestive) heart failure: Secondary | ICD-10-CM

## 2024-07-01 DIAGNOSIS — J810 Acute pulmonary edema: Secondary | ICD-10-CM | POA: Diagnosis present

## 2024-07-01 DIAGNOSIS — R791 Abnormal coagulation profile: Secondary | ICD-10-CM | POA: Diagnosis present

## 2024-07-01 DIAGNOSIS — D649 Anemia, unspecified: Secondary | ICD-10-CM | POA: Diagnosis present

## 2024-07-01 DIAGNOSIS — I5033 Acute on chronic diastolic (congestive) heart failure: Secondary | ICD-10-CM | POA: Diagnosis present

## 2024-07-01 DIAGNOSIS — I422 Other hypertrophic cardiomyopathy: Secondary | ICD-10-CM | POA: Diagnosis present

## 2024-07-01 DIAGNOSIS — J9601 Acute respiratory failure with hypoxia: Secondary | ICD-10-CM | POA: Diagnosis present

## 2024-07-01 DIAGNOSIS — N179 Acute kidney failure, unspecified: Secondary | ICD-10-CM | POA: Diagnosis not present

## 2024-07-01 DIAGNOSIS — Z803 Family history of malignant neoplasm of breast: Secondary | ICD-10-CM | POA: Diagnosis not present

## 2024-07-01 DIAGNOSIS — R0602 Shortness of breath: Secondary | ICD-10-CM

## 2024-07-01 DIAGNOSIS — I5A Non-ischemic myocardial injury (non-traumatic): Secondary | ICD-10-CM | POA: Diagnosis present

## 2024-07-01 DIAGNOSIS — I4892 Unspecified atrial flutter: Secondary | ICD-10-CM | POA: Diagnosis present

## 2024-07-01 DIAGNOSIS — I10 Essential (primary) hypertension: Secondary | ICD-10-CM | POA: Diagnosis not present

## 2024-07-01 DIAGNOSIS — I480 Paroxysmal atrial fibrillation: Secondary | ICD-10-CM

## 2024-07-01 DIAGNOSIS — Z8249 Family history of ischemic heart disease and other diseases of the circulatory system: Secondary | ICD-10-CM

## 2024-07-01 DIAGNOSIS — Z7901 Long term (current) use of anticoagulants: Secondary | ICD-10-CM | POA: Diagnosis not present

## 2024-07-01 DIAGNOSIS — E1122 Type 2 diabetes mellitus with diabetic chronic kidney disease: Secondary | ICD-10-CM | POA: Diagnosis present

## 2024-07-01 DIAGNOSIS — Z823 Family history of stroke: Secondary | ICD-10-CM | POA: Diagnosis not present

## 2024-07-01 DIAGNOSIS — M549 Dorsalgia, unspecified: Secondary | ICD-10-CM | POA: Diagnosis present

## 2024-07-01 DIAGNOSIS — N3001 Acute cystitis with hematuria: Secondary | ICD-10-CM | POA: Diagnosis not present

## 2024-07-01 DIAGNOSIS — I2489 Other forms of acute ischemic heart disease: Secondary | ICD-10-CM | POA: Diagnosis present

## 2024-07-01 DIAGNOSIS — I509 Heart failure, unspecified: Principal | ICD-10-CM

## 2024-07-01 DIAGNOSIS — I214 Non-ST elevation (NSTEMI) myocardial infarction: Secondary | ICD-10-CM | POA: Diagnosis not present

## 2024-07-01 DIAGNOSIS — K219 Gastro-esophageal reflux disease without esophagitis: Secondary | ICD-10-CM | POA: Diagnosis present

## 2024-07-01 LAB — HEMOGLOBIN A1C
Hgb A1c MFr Bld: 6.7 % — ABNORMAL HIGH (ref 4.8–5.6)
Mean Plasma Glucose: 145.59 mg/dL

## 2024-07-01 LAB — CBC WITH DIFFERENTIAL/PLATELET
Abs Immature Granulocytes: 0.03 K/uL (ref 0.00–0.07)
Basophils Absolute: 0 K/uL (ref 0.0–0.1)
Basophils Relative: 1 %
Eosinophils Absolute: 0.3 K/uL (ref 0.0–0.5)
Eosinophils Relative: 4 %
HCT: 34.6 % — ABNORMAL LOW (ref 36.0–46.0)
Hemoglobin: 10.8 g/dL — ABNORMAL LOW (ref 12.0–15.0)
Immature Granulocytes: 0 %
Lymphocytes Relative: 10 %
Lymphs Abs: 0.7 K/uL (ref 0.7–4.0)
MCH: 28.6 pg (ref 26.0–34.0)
MCHC: 31.2 g/dL (ref 30.0–36.0)
MCV: 91.8 fL (ref 80.0–100.0)
Monocytes Absolute: 0.8 K/uL (ref 0.1–1.0)
Monocytes Relative: 11 %
Neutro Abs: 5.5 K/uL (ref 1.7–7.7)
Neutrophils Relative %: 74 %
Platelets: 227 K/uL (ref 150–400)
RBC: 3.77 MIL/uL — ABNORMAL LOW (ref 3.87–5.11)
RDW: 14.6 % (ref 11.5–15.5)
WBC: 7.4 K/uL (ref 4.0–10.5)
nRBC: 0 % (ref 0.0–0.2)

## 2024-07-01 LAB — PROTIME-INR
INR: 1.7 — ABNORMAL HIGH (ref 0.8–1.2)
Prothrombin Time: 20.8 s — ABNORMAL HIGH (ref 11.4–15.2)

## 2024-07-01 LAB — TROPONIN T, HIGH SENSITIVITY
Troponin T High Sensitivity: 29 ng/L — ABNORMAL HIGH (ref 0–19)
Troponin T High Sensitivity: 30 ng/L — ABNORMAL HIGH (ref 0–19)

## 2024-07-01 LAB — RESP PANEL BY RT-PCR (RSV, FLU A&B, COVID)  RVPGX2
Influenza A by PCR: NEGATIVE
Influenza B by PCR: NEGATIVE
Resp Syncytial Virus by PCR: NEGATIVE
SARS Coronavirus 2 by RT PCR: NEGATIVE

## 2024-07-01 LAB — BASIC METABOLIC PANEL WITH GFR
Anion gap: 10 (ref 5–15)
BUN: 13 mg/dL (ref 8–23)
CO2: 26 mmol/L (ref 22–32)
Calcium: 8.4 mg/dL — ABNORMAL LOW (ref 8.9–10.3)
Chloride: 101 mmol/L (ref 98–111)
Creatinine, Ser: 0.88 mg/dL (ref 0.44–1.00)
GFR, Estimated: >60 mL/min (ref >60)
Glucose, Bld: 141 mg/dL — ABNORMAL HIGH (ref 70–99)
Potassium: 4.1 mmol/L (ref 3.5–5.1)
Sodium: 137 mmol/L (ref 135–145)

## 2024-07-01 LAB — CBG MONITORING, ED
Glucose-Capillary: 121 mg/dL — ABNORMAL HIGH (ref 70–99)
Glucose-Capillary: 161 mg/dL — ABNORMAL HIGH (ref 70–99)

## 2024-07-01 LAB — GLUCOSE, CAPILLARY
Glucose-Capillary: 91 mg/dL (ref 70–99)
Glucose-Capillary: 128 mg/dL — ABNORMAL HIGH (ref 70–99)

## 2024-07-01 LAB — PRO BRAIN NATRIURETIC PEPTIDE: Pro Brain Natriuretic Peptide: 4452 pg/mL — ABNORMAL HIGH (ref <300.0)

## 2024-07-01 MED ORDER — FUROSEMIDE 10 MG/ML IJ SOLN
40.0000 mg | Freq: Once | INTRAMUSCULAR | Status: AC
  Administered 2024-07-01: 40 mg via INTRAVENOUS
  Filled 2024-07-01: qty 4

## 2024-07-01 MED ORDER — WARFARIN - PHARMACIST DOSING INPATIENT
Freq: Every day | Status: DC
  Filled 2024-07-01: qty 1

## 2024-07-01 MED ORDER — SODIUM CHLORIDE 0.9 % IV SOLN: 250.0000 mL | INTRAVENOUS | Status: AC | PRN

## 2024-07-01 MED ORDER — PANTOPRAZOLE SODIUM 40 MG PO TBEC
40.0000 mg | DELAYED_RELEASE_TABLET | Freq: Every day | ORAL | Status: DC
  Administered 2024-07-01 – 2024-07-06 (×6): 40 mg via ORAL
  Filled 2024-07-01 (×6): qty 1

## 2024-07-01 MED ORDER — IOHEXOL 350 MG/ML SOLN
75.0000 mL | Freq: Once | INTRAVENOUS | Status: AC | PRN
  Administered 2024-07-01: 75 mL via INTRAVENOUS

## 2024-07-01 MED ORDER — IRBESARTAN 150 MG PO TABS
300.0000 mg | ORAL_TABLET | Freq: Every day | ORAL | Status: DC
  Administered 2024-07-01 – 2024-07-06 (×6): 300 mg via ORAL
  Filled 2024-07-01 (×6): qty 2

## 2024-07-01 MED ORDER — OXYBUTYNIN CHLORIDE ER 5 MG PO TB24
15.0000 mg | ORAL_TABLET | Freq: Every day | ORAL | Status: DC
  Administered 2024-07-01 – 2024-07-05 (×5): 15 mg via ORAL
  Filled 2024-07-01 (×5): qty 1

## 2024-07-01 MED ORDER — INSULIN ASPART 100 UNIT/ML IJ SOLN
0.0000 [IU] | Freq: Three times a day (TID) | INTRAMUSCULAR | Status: DC
  Administered 2024-07-01: 2 [IU] via SUBCUTANEOUS
  Administered 2024-07-03 – 2024-07-05 (×3): 1 [IU] via SUBCUTANEOUS
  Filled 2024-07-01 (×3): qty 1
  Filled 2024-07-01: qty 2

## 2024-07-01 MED ORDER — SODIUM CHLORIDE 0.9% FLUSH
3.0000 mL | Freq: Two times a day (BID) | INTRAVENOUS | Status: DC
  Administered 2024-07-01 – 2024-07-06 (×11): 3 mL via INTRAVENOUS

## 2024-07-01 MED ORDER — IPRATROPIUM-ALBUTEROL 0.5-2.5 (3) MG/3ML IN SOLN
3.0000 mL | Freq: Once | RESPIRATORY_TRACT | Status: AC
  Administered 2024-07-01: 3 mL via RESPIRATORY_TRACT
  Filled 2024-07-01: qty 3

## 2024-07-01 MED ORDER — ONDANSETRON HCL 4 MG/2ML IJ SOLN: 4.0000 mg | Freq: Four times a day (QID) | INTRAMUSCULAR | Status: DC | PRN

## 2024-07-01 MED ORDER — ATORVASTATIN CALCIUM 20 MG PO TABS
20.0000 mg | ORAL_TABLET | Freq: Every day | ORAL | Status: DC
  Administered 2024-07-01 – 2024-07-06 (×6): 20 mg via ORAL
  Filled 2024-07-01 (×6): qty 1

## 2024-07-01 MED ORDER — SODIUM CHLORIDE 0.9% FLUSH: 3.0000 mL | INTRAVENOUS | Status: DC | PRN

## 2024-07-01 MED ORDER — WARFARIN SODIUM 4 MG PO TABS
4.0000 mg | ORAL_TABLET | Freq: Once | ORAL | Status: AC
  Administered 2024-07-01: 4 mg via ORAL
  Filled 2024-07-01 (×2): qty 1

## 2024-07-01 MED ORDER — ACETAMINOPHEN 325 MG PO TABS
325.0000 mg | ORAL_TABLET | ORAL | Status: DC | PRN
  Administered 2024-07-05: 650 mg via ORAL
  Filled 2024-07-01: qty 2

## 2024-07-01 MED ORDER — FUROSEMIDE 10 MG/ML IJ SOLN
40.0000 mg | Freq: Two times a day (BID) | INTRAMUSCULAR | Status: DC
  Administered 2024-07-01 – 2024-07-04 (×6): 40 mg via INTRAVENOUS
  Filled 2024-07-01 (×6): qty 4

## 2024-07-01 NOTE — H&P (Signed)
 History and Physical    Sarah Sweeney FMW:[id number] DOB: 01-14-1941 DOA: 07/01/2024  PCP: Sarah Alm SQUIBB, MD (Confirm with patient/family/NH records and if not entered, this has to be entered at [facility] point of entry) Patient coming from: Home  I have personally briefly reviewed patient's old medical records in [facility]  Chief Complaint: SOB, leg swelling  HPI: Sarah Sweeney is a 83 y.o. 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  Symptoms started about 1 week ago, patient started noticed increasing bilateral ankle swelling, and she gained about 7 pounds over 7 days.  Since Saturday, she started to feel increasing exertional dyspnea, she took as needed Lasix for weight gain and she took Lasix last Saturday and Sunday with some improvement however last night her symptoms became worse and could not lie flat because of shortness of breath and came to ED.  She denies any cough no fever or chills.  EMS arrived at her home and found her O2 saturation 84% on room air, and patient was found to speak in broken sentences and was placed on CPAP. ED Course: Afebrile, borderline bradycardia heart rate upper 50s, O2 saturation 98% on BiPAP.  Chest x-ray showed bilateral pulmonary congestion.  CTA showed right lower lobe chronic PE but no acute PE, and bilateral ground glass like changes compatible with pulmonary edema.  Blood work showed BUN 13 creatinine 0.8 bicarb 26K 4.1.  VBG 7 point 40/48/29.  Patient was given IV Lasix 40 mg x 1 in the ED.  Review of Systems: As per HPI otherwise 14 point review of systems negative.   Past Medical History:  Diagnosis Date   Allergy    Apical variant hypertrophic cardiomyopathy (HCC)    Atrial fibrillation and flutter (HCC)    Diabetes mellitus without  complication (HCC)    Erythrocytosis 03/02/2015   GERD (gastroesophageal reflux disease)    Headache    Hypertension    Pulmonary embolism (HCC) 2013   Coumadin  Therapy   Sleep apnea    CPAP   Symptomatic bradycardia    s/p single-chamber pacemaker (08/2021 - Dr. Cindie)    Past Surgical History:  Procedure Laterality Date   APPENDECTOMY     CARDIOVERSION N/A 07/17/2019   Procedure: CARDIOVERSION;  Surgeon: Perla Evalene PARAS, MD;  Location: [facility] OR[facility];  Service: Cardiovascular;  Laterality: N/A;   CATARACT EXTRACTION W/PHACO Left 09/21/2020   Procedure: CATARACT EXTRACTION PHACO AND INTRAOCULAR LENS PLACEMENT (IOC) LEFT 4.94 00:36.2;  Surgeon: Jaye Fallow, MD;  Location: [facility];  Service: Ophthalmology;  Laterality: Left;  sleep apnea   CATARACT EXTRACTION W/PHACO Right 10/05/2020   Procedure: CATARACT EXTRACTION PHACO AND INTRAOCULAR LENS PLACEMENT (IOC) RIGHT 4.44 00:34.3 ;  Surgeon: Jaye Fallow, MD;  Location: [facility];  Service: Ophthalmology;  Laterality: Right;  Diabetic - diet controlled   COLONOSCOPY WITH PROPOFOL  N/A 03/04/2019   Procedure: COLONOSCOPY WITH PROPOFOL ;  Surgeon: Toledo, Ladell POUR, MD;  Location: [facility];  Service: Gastroenterology;  Laterality: N/A;   PACEMAKER IMPLANT N/A 09/08/2021   Procedure: PACEMAKER IMPLANT;  Surgeon: Cindie Ole DASEN, MD;  Location: [facility] CV [facility];  Service: Cardiovascular;  Laterality: N/A;   TONSILLECTOMY       reports that she has never smoked. She has never used smokeless tobacco. She reports that she does not drink alcohol and does not use  drugs.  No Known Allergies  Family History  Problem Relation Age of Onset   Hypertension Mother    Breast cancer Maternal Aunt        42's   Cancer Maternal Uncle    Stroke Maternal Grandfather      Prior to Admission medications   Medication Sig Start Date End Date Taking? Authorizing Provider  acetaminophen  (TYLENOL ) 325 MG tablet Take 1-2  tablets (325-650 mg total) by mouth every 4 (four) hours as needed for mild pain. 09/09/21  Yes Lesia Ozell Barter, PA-C  alendronate (FOSAMAX) 70 MG tablet Take 70 mg by mouth once a week. 01/15/24  Yes [provider]  amLODipine  (NORVASC ) 2.5 MG tablet TAKE ONE TABLET BY MOUTH ONCE DAILY 11/14/23  Yes End, Lonni, MD  atorvastatin  (LIPITOR) 20 MG tablet TAKE 1 TABLET BY MOUTH DAILY 10/30/23  Yes End, Lonni, MD  Cyanocobalamin (B-12 PO) Take by mouth daily.   Yes [provider]  DENTA 5000 PLUS 1.1 % CREA dental cream Take 1 Application by mouth daily. 01/15/24  Yes [provider]  diclofenac  Sodium (VOLTAREN ) 1 % GEL Apply 1 application topically 2 (two) times daily as needed (pain).   Yes [provider]  furosemide (LASIX) 20 MG tablet Take 20 mg by mouth daily.   Yes [provider]  latanoprost (XALATAN) 0.005 % ophthalmic solution Place 1 drop into both eyes at bedtime. 02/02/14  Yes [provider]  metFORMIN (GLUCOPHAGE-XR) 500 MG 24 hr tablet Take 500 mg by mouth daily with breakfast.   Yes [provider]  olmesartan  (BENICAR ) 40 MG tablet TAKE ONE TABLET (40 MG) BY MOUTH ONCE DAILY 04/11/24  Yes End, Lonni, MD  omeprazole (PRILOSEC) 40 MG capsule Take 40 mg by mouth daily.  08/24/15  Yes [provider]  oxybutynin  (DITROPAN  XL) 15 MG 24 hr tablet Take 15 mg by mouth at bedtime.    Yes [provider]  Vitamin D, Ergocalciferol, (DRISDOL) 1.25 MG (50000 UNIT) CAPS capsule Take 50,000 Units by mouth once a week. 08/28/23  Yes [provider]  warfarin (COUMADIN ) 3 MG tablet Take 3 mg by mouth daily. Taking 2 mg Mon, Wed, Fri and 3 mg on Sun, Tu, Th, Sat 01/13/22  Yes [provider]    Physical Exam: Vitals:   07/01/24 0400 07/01/24 0430 07/01/24 0600 07/01/24 0700  BP: (!) 162/99 (!) 156/66 (!) 156/63 (!) 160/70  Pulse: 61 (!) 59 (!) 59 (!) 58  Resp: (!) 25 (!) 27 (!) 25 (!)  21  Temp:      SpO2: 100% 100% 100% 100%  Weight:      Height:        Constitutional: NAD, calm, comfortable Vitals:   07/01/24 0400 07/01/24 0430 07/01/24 0600 07/01/24 0700  BP: (!) 162/99 (!) 156/66 (!) 156/63 (!) 160/70  Pulse: 61 (!) 59 (!) 59 (!) 58  Resp: (!) 25 (!) 27 (!) 25 (!) 21  Temp:      SpO2: 100% 100% 100% 100%  Weight:      Height:       Eyes: PERRL, lids and conjunctivae normal ENMT: Mucous membranes are moist. Posterior pharynx clear of any exudate or lesions.Normal dentition.  Neck: normal, supple, no masses, no thyromegaly Respiratory: clear to auscultation bilaterally, no wheezing, bilateral fine crackles, increasing respiratory effort. No accessory muscle use.  Cardiovascular: Regular rate and rhythm, no murmurs / rubs / gallops.  2+ extremity edema. 2+ pedal pulses. No  carotid bruits.  Abdomen: no tenderness, no masses palpated. No hepatosplenomegaly. Bowel sounds positive.  Musculoskeletal: no clubbing / cyanosis. No joint deformity upper and lower extremities. Good ROM, no contractures. Normal muscle tone.  Skin: no rashes, lesions, ulcers. No induration Neurologic: CN 2-12 grossly intact. Sensation intact, DTR normal. Strength 5/5 in all 4.  Psychiatric: Normal judgment and insight. Alert and oriented x 3. Normal mood.     Labs on Admission: I have personally reviewed following labs and imaging studies  CBC: Recent Labs  Lab 07/01/24 0229  WBC 7.4  NEUTROABS 5.5  HGB 10.8*  HCT 34.6*  MCV 91.8  PLT 227   Basic Metabolic Panel: Recent Labs  Lab 07/01/24 0229  NA 137  K 4.1  CL 101  CO2 26  GLUCOSE 141*  BUN 13  CREATININE 0.88  CALCIUM  8.4*   GFR: Estimated Creatinine Clearance: 47.3 mL/min (by C-G formula based on SCr of 0.88 mg/dL). Liver Function Tests: No results for input(s): AST, ALT, ALKPHOS, BILITOT, PROT, ALBUMIN in the last 168 hours. No results for input(s): LIPASE, AMYLASE in the last 168 hours. No  results for input(s): AMMONIA in the last 168 hours. Coagulation Profile: Recent Labs  Lab 07/01/24 0229  INR 1.7*   Cardiac Enzymes: No results for input(s): CKTOTAL, CKMB, CKMBINDEX, TROPONINI in the last 168 hours. BNP (last 3 results) Recent Labs    07/01/24 0229  PROBNP 4,452.0*   HbA1C: No results for input(s): HGBA1C in the last 72 hours. CBG: Recent Labs  Lab 07/01/24 0803  GLUCAP 121*   Lipid Profile: No results for input(s): CHOL, HDL, LDLCALC, TRIG, CHOLHDL, LDLDIRECT in the last 72 hours. Thyroid  Function Tests: No results for input(s): TSH, T4TOTAL, FREET4, T3FREE, THYROIDAB in the last 72 hours. Anemia Panel: No results for input(s): VITAMINB12, FOLATE, FERRITIN, TIBC, IRON, RETICCTPCT in the last 72 hours. Urine analysis:    Component Value Date/Time   COLORURINE STRAW (A) 02/14/2022 1544   APPEARANCEUR CLEAR (A) 02/14/2022 1544   LABSPEC 1.005 02/14/2022 1544   PHURINE 6.0 02/14/2022 1544   GLUCOSEU NEGATIVE 02/14/2022 1544   HGBUR NEGATIVE 02/14/2022 1544   BILIRUBINUR NEGATIVE 02/14/2022 1544   KETONESUR NEGATIVE 02/14/2022 1544   PROTEINUR NEGATIVE 02/14/2022 1544   NITRITE NEGATIVE 02/14/2022 1544   LEUKOCYTESUR NEGATIVE 02/14/2022 1544    Radiological Exams on Admission: CT Angio Chest PE W and/or Wo Contrast Result Date: 07/01/2024 EXAM: CTA CHEST 07/01/2024 03:02:56 AM TECHNIQUE: CTA of the chest was performed without and with the administration of 75 mL of iohexol (OMNIPAQUE) 350 MG/ML injection. Multiplanar reformatted images are provided for review. MIP images are provided for review. Automated exposure control, iterative reconstruction, and/or weight based adjustment of the mA/kV was utilized to reduce the radiation dose to as low as reasonably achievable. COMPARISON: None available. CLINICAL HISTORY: History of PE, high risk, here with shortness of breath and sats of 84% on room air. FINDINGS:  PULMONARY ARTERIES: Pulmonary arteries are adequately opacified for evaluation. Small web noted within the right lower lobar pulmonary artery keeping with remote pulmonary embolism. No acute pulmonary embolus identified. Main pulmonary artery is normal in caliber. MEDIASTINUM: Equivocal cardiac size within normal limits. Left subclavian single lead pacemaker in place with its lead within the intraventricular septum. No pericardial effusion. Mild multivessel coronary artery calcification. Mild atherosclerotic calcification within the thoracic aorta. No aortic aneurysm. The thyroid  is unremarkable. LYMPH NODES: Shotty mediastinal adenopathy nonspecific which limits measuring up to 12 mm in short axis diameter.  No hilar or axillary lymphadenopathy. LUNGS AND PLEURA: There is extensive patchy bilateral ground-glass pulmonary infiltrate with associated smooth interlobular septal thickening in keeping with moderate pulmonary edema. Small bilateral pleural effusions are present. The findings, together, may reflect changes of moderate pulmonary edema. 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 Similar AP narrowing of the bronchus intermedius suggests some bronchial in this location. No pneumothorax. UPPER ABDOMEN: Limited images of the upper abdomen are unremarkable. SOFT TISSUES AND BONES: Osseous structures are age appropriate. No acute soft tissue abnormality. IMPRESSION: 1. No acute pulmonary embolism. Small web in the right lower lobar pulmonary artery consistent with remote pulmonary embolism 2. Extensive patchy bilateral ground-glass opacities with smooth interlobular septal thickening, consistent with moderate pulmonary edema, with small bilateral pleural effusions. Altogether, the findings are in keeping with changes of moderate cardiogenic failure. 3. 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  Ventricular paced  Assessment/Plan Principal Problem:   Acute pulmonary edema (HCC) Active Problems:   CHF (congestive heart failure) (HCC)  (please populate well all problems here in Problem List. (For example, if patient is on BP meds at home and you resume or decide to hold them, it is a problem that needs to be her. Same for CAD, COPD, HLD and so on)  Acute on chronic HFpEF decompensation Acute hypoxic respiratory failure Acute pulmonary edema -Continue BiPAP - Continue IV Lasix 40 mg twice daily - Echocardiogram  Elevated troponins Myocardial injury -Pattern of elevation of troponins flat, likely secondary to CHF decompensation and demanding ischemia from hypoxia.  ACS ruled out. - Echocardiogram to follow.  PAF History of symptomatic bradycardia status post PPM -EKG showed chronic A-flutter with heart rate in the range of 60s with intermittent pacing. - Patient denied any palpitations recently. - Consult pharmacy for Coumadin   History of PE - CTA showed chronic right lower lobe PE but no acute PE - Continue Coumadin   IIDM - Hold off metformin as patient received IV contrast today - SSI  HTN - On IV Lasix - Hold all amlodipine  due to worsening of peripheral edema.  DVT prophylaxis: Coumadin  Code Status: Full code Family Communication: None at bedside Disposition Plan: Patient sick with  acute CHF decompensation pulmonary edema requiring IV Lasix and BiPAP support, expect more than 2 midnight hospital stay. Consults called: None Admission status: PCU admit   Cort ONEIDA Mana MD [facility] Pager [phone]  07/01/2024, 8:06 AM

## 2024-07-01 NOTE — ED Notes (Signed)
 CMD called at this time.

## 2024-07-01 NOTE — ED Triage Notes (Signed)
 PT BIB [facility] from home for increased SOB. PT initial O2 was 84%. Pt was placed on 2 L [place] O2 went to 100%. Pt unable to speak in full sentences to provider at bedside. Pt denies Hx of Asthma or COPD. Pt reports increased swelling in legs.

## 2024-07-01 NOTE — ED Provider Notes (Signed)
 [facility] Provider Note    Event Date/Time   First MD Initiated Contact with Patient 07/01/24 0222     (approximate)   History   Shortness of Breath   HPI  Sarah Sweeney is a 83 y.o. female with history of hypertrophic cardiomyopathy, atrial fibrillation, hypertension, diabetes, PE on Coumadin , symptomatic bradycardia status post pacemaker who presents to the emergency department shortness of breath that started yesterday.  EMS reports patient's sats were 84% on room air on fire department arrival.  She does not wear oxygen chronically.  Denies fevers, cough.  Has noticed swelling in both of her legs but no calf tenderness.  Reports compliance with her Coumadin .  States her PE was many years ago.  Denies history of asthma, COPD, tobacco use.   History provided by patient, EMS.    Past Medical History:  Diagnosis Date   Allergy    Apical variant hypertrophic cardiomyopathy (HCC)    Atrial fibrillation and flutter (HCC)    Diabetes mellitus without complication (HCC)    Erythrocytosis 03/02/2015   GERD (gastroesophageal reflux disease)    Headache    Hypertension    Pulmonary embolism (HCC) 2013   Coumadin  Therapy   Sleep apnea    CPAP   Symptomatic bradycardia    s/p single-chamber pacemaker (08/2021 - Dr. Cindie)    Past Surgical History:  Procedure Laterality Date   APPENDECTOMY     CARDIOVERSION N/A 07/17/2019   Procedure: CARDIOVERSION;  Surgeon: Perla Evalene PARAS, MD;  Location: [facility] OR[facility];  Service: Cardiovascular;  Laterality: N/A;   CATARACT EXTRACTION W/PHACO Left 09/21/2020   Procedure: CATARACT EXTRACTION PHACO AND INTRAOCULAR LENS PLACEMENT (IOC) LEFT 4.94 00:36.2;  Surgeon: Jaye Fallow, MD;  Location: [facility];  Service: Ophthalmology;  Laterality: Left;  sleep apnea   CATARACT EXTRACTION W/PHACO Right 10/05/2020   Procedure: CATARACT EXTRACTION PHACO AND INTRAOCULAR LENS PLACEMENT (IOC) RIGHT 4.44 00:34.3 ;   Surgeon: Jaye Fallow, MD;  Location: [facility];  Service: Ophthalmology;  Laterality: Right;  Diabetic - diet controlled   COLONOSCOPY WITH PROPOFOL  N/A 03/04/2019   Procedure: COLONOSCOPY WITH PROPOFOL ;  Surgeon: Toledo, Ladell POUR, MD;  Location: [facility];  Service: Gastroenterology;  Laterality: N/A;   PACEMAKER IMPLANT N/A 09/08/2021   Procedure: PACEMAKER IMPLANT;  Surgeon: Cindie Ole DASEN, MD;  Location: [facility];  Service: Cardiovascular;  Laterality: N/A;   TONSILLECTOMY      MEDICATIONS:  Prior to Admission medications   Medication Sig Start Date End Date Taking? Authorizing Provider  acetaminophen  (TYLENOL ) 325 MG tablet Take 1-2 tablets (325-650 mg total) by mouth every 4 (four) hours as needed for mild pain. 09/09/21   Lesia Ozell Barter, PA-C  alendronate (FOSAMAX) 70 MG tablet Take 70 mg by mouth once a week. 01/15/24   [provider]  amLODipine  (NORVASC ) 2.5 MG tablet TAKE ONE TABLET BY MOUTH ONCE DAILY 11/14/23   End, Lonni, MD  atorvastatin  (LIPITOR) 20 MG tablet TAKE 1 TABLET BY MOUTH DAILY 10/30/23   End, Lonni, MD  Cyanocobalamin (B-12 PO) Take by mouth daily.    [provider]  DENTA 5000 PLUS 1.1 % CREA dental cream Take 1 Application by mouth daily. 01/15/24   [provider]  diclofenac  Sodium (VOLTAREN ) 1 % GEL Apply 1 application topically 2 (two) times daily as needed (pain).    [provider]  latanoprost (XALATAN) 0.005 % ophthalmic solution Place 1 drop into both eyes at bedtime. 02/02/14  [provider]  metFORMIN (GLUCOPHAGE-XR) 500 MG 24 hr tablet Take 500 mg by mouth daily with breakfast.    [provider]  olmesartan  (BENICAR ) 40 MG tablet TAKE ONE TABLET (40 MG) BY MOUTH ONCE DAILY 04/11/24   End, Lonni, MD  omeprazole (PRILOSEC) 40 MG capsule Take 40 mg by mouth daily.  08/24/15   [provider]  oxybutynin  (DITROPAN  XL) 15 MG 24 hr tablet Take  15 mg by mouth at bedtime.     [provider]  Vitamin D, Ergocalciferol, (DRISDOL) 1.25 MG (50000 UNIT) CAPS capsule Take 50,000 Units by mouth once a week. 08/28/23   [provider]  warfarin (COUMADIN ) 3 MG tablet Take 3 mg by mouth daily. Taking 2 mg Mon, Wed, Fri and 3 mg on Sun, Tu, Th, Sat 01/13/22   [provider]    Physical Exam   Triage Vital Signs: ED Triage Vitals  Encounter Vitals Group     BP 07/01/24 0220 (!) 172/61     Girls Systolic BP Percentile --      Girls Diastolic BP Percentile --      Boys Systolic BP Percentile --      Boys Diastolic BP Percentile --      Pulse Rate 07/01/24 0220 78     Resp 07/01/24 0220 (!) 26     Temp 07/01/24 0220 98.6 F (37 C)     Temp src --      SpO2 07/01/24 0220 96 %     Weight 07/01/24 0227 154 lb 1.6 oz (69.9 kg)     Height 07/01/24 0227 5' 4 (1.626 m)     Head Circumference --      Peak Flow --      Pain Score 07/01/24 0221 0     Pain Loc --      Pain Education --      Exclude from Growth Chart --       Most recent vital signs: Vitals:   07/01/24 0220 07/01/24 0330  BP: (!) 172/61 (!) 149/53  Pulse: 78 60  Resp: (!) 26 (!) 22  Temp: 98.6 F (37 C)   SpO2: 96% 99%    CONSTITUTIONAL: Alert, responds appropriately to questions.  Elderly HEAD: Normocephalic, atraumatic EYES: Conjunctivae clear, pupils appear equal, sclera nonicteric ENT: normal nose; moist mucous membranes NECK: Supple, normal ROM CARD: RRR; S1 and S2 appreciated RESP: Patient is tachypneic with increased work of breathing and is speaking truncated sentences.  She has bibasilar crackles on exam.  No rhonchi, wheezing.  Sats 95% on room air at rest. ABD/GI: Non-distended; soft, non-tender, no rebound, no guarding, no peritoneal signs BACK: The back appears normal EXT: Normal ROM in all joints; no deformity noted, no calf tenderness but patient does have +1 pitting edema in bilateral distal lower extremities that is  symmetric in nature SKIN: Normal color for age and race; warm; no rash on exposed skin NEURO: Moves all extremities equally, normal speech PSYCH: The patient's mood and manner are appropriate.   ED Results / Procedures / Treatments   LABS: (all labs ordered are listed, but only abnormal results are displayed) Labs Reviewed  CBC WITH DIFFERENTIAL/PLATELET - Abnormal; Notable for the following components:      Result Value   RBC 3.77 (*)    Hemoglobin 10.8 (*)    HCT 34.6 (*)    All other components within normal limits  BASIC METABOLIC PANEL WITH GFR - Abnormal; Notable for the  following components:   Glucose, Bld 141 (*)    Calcium  8.4 (*)    All other components within normal limits  PRO BRAIN NATRIURETIC PEPTIDE - Abnormal; Notable for the following components:   Pro Brain Natriuretic Peptide 4,452.0 (*)    All other components within normal limits  BLOOD GAS, VENOUS - Abnormal; Notable for the following components:   Bicarbonate 29.7 (*)    Acid-Base Excess 4.0 (*)    All other components within normal limits  PROTIME-INR - Abnormal; Notable for the following components:   Prothrombin Time 20.8 (*)    INR 1.7 (*)    All other components within normal limits  TROPONIN T, HIGH SENSITIVITY - Abnormal; Notable for the following components:   Troponin T High Sensitivity 29 (*)    All other components within normal limits  RESP PANEL BY RT-PCR (RSV, FLU A&B, COVID)  RVPGX2  TROPONIN T, HIGH SENSITIVITY     EKG:  EKG Interpretation Date/Time:  Tuesday July 01 2024 02:21:44 EST Ventricular Rate:  71 PR Interval:  155 QRS Duration:  127 QT Interval:  431 QTC Calculation: 469 R Axis:   79  Text Interpretation: Sinus or ectopic atrial rhythm Right bundle branch block Left ventricular hypertrophy Minimal ST elevation, inferior leads Confirmed by Neomi Neptune [id number]) on 07/01/2024 2:34:51 AM         RADIOLOGY: My personal review and interpretation of imaging: CT  scan shows pulmonary edema.  I have personally reviewed all radiology reports.   CT Angio Chest PE W and/or Wo Contrast Result Date: 07/01/2024 EXAM: CTA CHEST 07/01/2024 03:02:56 AM TECHNIQUE: CTA of the chest was performed without and with the administration of 75 mL of iohexol (OMNIPAQUE) 350 MG/ML injection. Multiplanar reformatted images are provided for review. MIP images are provided for review. Automated exposure control, iterative reconstruction, and/or weight based adjustment of the mA/kV was utilized to reduce the radiation dose to as low as reasonably achievable. COMPARISON: None available. CLINICAL HISTORY: History of PE, high risk, here with shortness of breath and sats of 84% on room air. FINDINGS: PULMONARY ARTERIES: Pulmonary arteries are adequately opacified for evaluation. Small web noted within the right lower lobar pulmonary artery keeping with remote pulmonary embolism. No acute pulmonary embolus identified. Main pulmonary artery is normal in caliber. MEDIASTINUM: Equivocal cardiac size within normal limits. Left subclavian single lead pacemaker in place with its lead within the intraventricular septum. No pericardial effusion. Mild multivessel coronary artery calcification. Mild atherosclerotic calcification within the thoracic aorta. No aortic aneurysm. The thyroid  is unremarkable. LYMPH NODES: Shotty mediastinal adenopathy nonspecific which limits measuring up to 12 mm in short axis diameter. No hilar or axillary lymphadenopathy. LUNGS AND PLEURA: There is extensive patchy bilateral ground-glass pulmonary infiltrate with associated smooth interlobular septal thickening in keeping with moderate pulmonary edema. Small bilateral pleural effusions are present. The findings, together, may reflect changes of moderate pulmonary edema. 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  Similar AP narrowing of the bronchus intermedius suggests some bronchial in this location. No pneumothorax. UPPER ABDOMEN: Limited images of the upper abdomen are unremarkable. SOFT TISSUES AND BONES: Osseous structures are age appropriate. No acute soft tissue abnormality. IMPRESSION: 1. No acute pulmonary embolism. Small web in the right lower lobar pulmonary artery consistent with remote pulmonary embolism 2. Extensive patchy bilateral ground-glass opacities with smooth interlobular septal thickening, consistent with moderate pulmonary edema, with  small bilateral pleural effusions. Altogether, the findings are in keeping with changes of moderate cardiogenic failure. 3. 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  Critical care was necessary to treat or prevent imminent or life-threatening deterioration.  Critical care was time spent personally by me on the following activities: development of treatment plan with patient and/or surrogate as well as nursing, discussions with consultants, evaluation of patient's response to treatment, examination of patient, obtaining history from patient or surrogate, ordering and performing treatments and interventions, ordering and review of laboratory studies, ordering and review of radiographic studies, pulse oximetry and re-evaluation of patient's condition.   SABRA1-3 Lead EKG Interpretation  Performed by: Kriti Katayama, Josette SAILOR, DO Authorized by: Candas Deemer, Josette SAILOR, DO     Interpretation: normal     ECG rate:  78   ECG  rate assessment: normal     Rhythm: sinus rhythm     Ectopy: none     Conduction: normal       IMPRESSION / MDM / ASSESSMENT AND PLAN / ED COURSE  I reviewed the triage vital signs and the nursing notes.    Patient here with complaints of shortness of breath, leg swelling.  History of hypertrophic cardiomyopathy and also has a history of PE.  She has mild to moderate respiratory distress here.  The patient is on the cardiac monitor to evaluate for evidence of arrhythmia and/or significant heart rate changes.   DIFFERENTIAL DIAGNOSIS (includes but not limited to):   CHF exacerbation, pneumonia, viral URI, pneumothorax, PE, ACS   Patient's presentation is most consistent with acute presentation with potential threat to life or bodily function.   PLAN: Will obtain labs, EKG, chest x-ray.  Will give breathing treatment to see if this improves her symptoms.  Patient will likely need to be on BiPAP for work of breathing although not hypoxic currently on room air but was hypoxic to 84% with EMS.  Does not wear oxygen chronically.  I am concerned that this could be a CHF exacerbation given her history of cardiomyopathy with shortness of breath, crackles and bilateral lower extremity swelling but also has significant history of PE and will need a CTA of her chest.  Will obtain cardiac labs, COVID and flu swab, CTA of the chest.  Patient will need admission to the hospital.   MEDICATIONS GIVEN IN ED: Medications  ipratropium-albuterol (DUONEB) 0.5-2.5 (3) MG/3ML nebulizer solution 3 mL (3 mLs Nebulization Given 07/01/24 0226)  iohexol (OMNIPAQUE) 350 MG/ML injection 75 mL (75 mLs Intravenous Contrast Given 07/01/24 0249)  furosemide (LASIX) injection 40 mg (40 mg Intravenous Given 07/01/24 0338)     ED COURSE: No significant improvement with breathing treatment.  Respiratory to place patient on BiPAP.  VBG reassuring.  BNP greater than 4000.  Troponin negative.  Mild anemia.  COVID, flu  and RSV negative.  CTA of the chest reviewed and interpreted by myself and the radiologist and shows diffuse pulmonary edema.  Patient getting IV Lasix.  Remote PE but no acute PE seen.  INR 1.7.  Will discuss with hospitalist for admission.   CONSULTS:  Consulted and discussed patient's case with hospitalist, Dr. Lawence.  I have recommended admission and consulting physician agrees and will place admission orders.  Patient (and family if present) agree with this plan.   I reviewed all nursing notes, vitals, pertinent previous records.  All labs, EKGs, imaging ordered have been independently reviewed and interpreted by myself.    OUTSIDE RECORDS REVIEWED: Reviewed recent internal medicine notes.       FINAL CLINICAL IMPRESSION(S) / ED DIAGNOSES   Final diagnoses:  Shortness of breath  Acute respiratory failure with hypoxia (HCC)  Acute on chronic congestive heart failure, unspecified heart failure type (HCC)     Rx / DC Orders   ED Discharge Orders     None        Note:  This document was prepared using Dragon voice recognition software and may include unintentional dictation errors.   Tyquan Carmickle, Josette SAILOR, DO 07/01/24 [phone]

## 2024-07-01 NOTE — Progress Notes (Signed)
 PHARMACY - ANTICOAGULATION CONSULT NOTE  Pharmacy Consult for warfarin Indication: atrial fibrillation and history of pulmonary embolus  No Known Allergies  Patient Measurements: Height: 5' 4 (162.6 cm) Weight: 69.9 kg (154 lb 1.6 oz) IBW/kg (Calculated) : 54.7 HEPARIN  DW (KG): 68.8  Vital Signs: Temp: 98.6 F (37 C) (11/18 0220) BP: 156/63 (11/18 0600) Pulse Rate: 59 (11/18 0600)  Labs: Recent Labs    07/01/24 0229  HGB 10.8*  HCT 34.6*  PLT 227  LABPROT 20.8*  INR 1.7*  CREATININE 0.88    Estimated Creatinine Clearance: 47.3 mL/min (by C-G formula based on SCr of 0.88 mg/dL).   Medical History: Past Medical History:  Diagnosis Date   Allergy    Apical variant hypertrophic cardiomyopathy (HCC)    Atrial fibrillation and flutter (HCC)    Diabetes mellitus without complication (HCC)    Erythrocytosis 03/02/2015   GERD (gastroesophageal reflux disease)    Headache    Hypertension    Pulmonary embolism (HCC) 2013   Coumadin  Therapy   Sleep apnea    CPAP   Symptomatic bradycardia    s/p single-chamber pacemaker (08/2021 - Dr. Cindie)    Assessment: 83 year old female with chronic afib on warfarin and history of PE. INR subtherapeutic at 1.7. Last clinic note was 11/13 where INR was 2.4. No bleeding or compliance issues noted.   Prior to admit warfarin dose is 2mg  MWF and 3mg  all other days.   Goal of Therapy:  INR 2-3 Monitor platelets by anticoagulation protocol: Yes   Plan:  Will give 4mg  tonight Daily INR for now  Dempsey Blush PharmD., BCPS Clinical Pharmacist 07/01/2024 7:52 AM   Dempsey Blush PharmD., BCPS Clinical Pharmacist 07/01/2024 7:49 AM

## 2024-07-01 NOTE — ED Notes (Signed)
 Pt assisted on and off the bedpan and resituated in bed.

## 2024-07-02 ENCOUNTER — Telehealth (HOSPITAL_COMMUNITY): Payer: Self-pay | Admitting: Pharmacy Technician

## 2024-07-02 ENCOUNTER — Inpatient Hospital Stay

## 2024-07-02 ENCOUNTER — Other Ambulatory Visit (HOSPITAL_COMMUNITY): Payer: Self-pay

## 2024-07-02 ENCOUNTER — Encounter: Payer: Self-pay | Admitting: Internal Medicine

## 2024-07-02 DIAGNOSIS — E663 Overweight: Secondary | ICD-10-CM

## 2024-07-02 DIAGNOSIS — R7989 Other specified abnormal findings of blood chemistry: Secondary | ICD-10-CM

## 2024-07-02 DIAGNOSIS — I10 Essential (primary) hypertension: Secondary | ICD-10-CM | POA: Diagnosis not present

## 2024-07-02 DIAGNOSIS — I5033 Acute on chronic diastolic (congestive) heart failure: Secondary | ICD-10-CM | POA: Diagnosis not present

## 2024-07-02 DIAGNOSIS — I480 Paroxysmal atrial fibrillation: Secondary | ICD-10-CM

## 2024-07-02 DIAGNOSIS — I5A Non-ischemic myocardial injury (non-traumatic): Secondary | ICD-10-CM

## 2024-07-02 DIAGNOSIS — Z86711 Personal history of pulmonary embolism: Secondary | ICD-10-CM

## 2024-07-02 DIAGNOSIS — J9601 Acute respiratory failure with hypoxia: Secondary | ICD-10-CM

## 2024-07-02 DIAGNOSIS — R0602 Shortness of breath: Secondary | ICD-10-CM

## 2024-07-02 DIAGNOSIS — I4821 Permanent atrial fibrillation: Secondary | ICD-10-CM | POA: Diagnosis not present

## 2024-07-02 LAB — BASIC METABOLIC PANEL WITH GFR
Anion gap: 8 (ref 5–15)
BUN: 13 mg/dL (ref 8–23)
CO2: 32 mmol/L (ref 22–32)
Calcium: 8.6 mg/dL — ABNORMAL LOW (ref 8.9–10.3)
Chloride: 102 mmol/L (ref 98–111)
Creatinine, Ser: 1.02 mg/dL — ABNORMAL HIGH (ref 0.44–1.00)
GFR, Estimated: 55 mL/min — ABNORMAL LOW (ref >60)
Glucose, Bld: 93 mg/dL (ref 70–99)
Potassium: 3.8 mmol/L (ref 3.5–5.1)
Sodium: 142 mmol/L (ref 135–145)

## 2024-07-02 LAB — BLOOD GAS, VENOUS
Acid-Base Excess: 4 mmol/L — ABNORMAL HIGH (ref 0.0–2.0)
Bicarbonate: 29.7 mmol/L — ABNORMAL HIGH (ref 20.0–28.0)
O2 Saturation: 45.9 %
Patient temperature: 37
pCO2, Ven: 48 mmHg (ref 44–60)
pH, Ven: 7.4 (ref 7.25–7.43)

## 2024-07-02 LAB — PROTIME-INR
INR: 2.1 — ABNORMAL HIGH (ref 0.8–1.2)
Prothrombin Time: 24.6 s — ABNORMAL HIGH (ref 11.4–15.2)

## 2024-07-02 LAB — ECHOCARDIOGRAM COMPLETE
Area-P 1/2: 5.2 cm2
Height: 64 in
S' Lateral: 3.6 cm
Weight: 2465.62 [oz_av]

## 2024-07-02 LAB — GLUCOSE, CAPILLARY
Glucose-Capillary: 100 mg/dL — ABNORMAL HIGH (ref 70–99)
Glucose-Capillary: 98 mg/dL (ref 70–99)
Glucose-Capillary: 102 mg/dL — ABNORMAL HIGH (ref 70–99)
Glucose-Capillary: 124 mg/dL — ABNORMAL HIGH (ref 70–99)

## 2024-07-02 MED ORDER — DAPAGLIFLOZIN PROPANEDIOL 10 MG PO TABS
10.0000 mg | ORAL_TABLET | Freq: Every day | ORAL | Status: DC
  Administered 2024-07-03 – 2024-07-06 (×4): 10 mg via ORAL
  Filled 2024-07-02 (×4): qty 1

## 2024-07-02 MED ORDER — WARFARIN SODIUM 2 MG PO TABS
2.0000 mg | ORAL_TABLET | Freq: Once | ORAL | Status: AC
  Administered 2024-07-02: 2 mg via ORAL
  Filled 2024-07-02: qty 1

## 2024-07-02 NOTE — Progress Notes (Signed)
 Progress Note   Patient: Sarah Sweeney FMW:[id number] DOB: August 12, 1941 DOA: 07/01/2024     1 DOS: the patient was seen and examined on 07/02/2024   Brief hospital course: 83 y.o. 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   Symptoms started about 1 week ago, patient started noticed increasing bilateral ankle swelling, and she gained about 7 pounds over 7 days.  Since Saturday, she started to feel increasing exertional dyspnea, she took as needed Lasix  for weight gain and she took Lasix  last Saturday and Sunday with some improvement however last night her symptoms became worse and could not lie flat because of shortness of breath and came to ED.  She denies any cough no fever or chills.  EMS arrived at her home and found her O2 saturation 84% on room air, and patient was found to speak in broken sentences and was placed on CPAP. ED Course: Afebrile, borderline bradycardia heart rate upper 50s, O2 saturation 98% on BiPAP.  Chest x-ray showed bilateral pulmonary congestion.  CTA showed right lower lobe chronic PE but no acute PE, and bilateral ground glass like changes compatible with pulmonary edema.  Blood work showed BUN 13 creatinine 0.8 bicarb 26K 4.1.  VBG 7.4/48/29.  11/19.  Patient still short of breath this morning.  Patient does not wear oxygen at home.  Patient admitted with CHF.  Continue IV Lasix .  Assessment and Plan: * Acute on chronic diastolic CHF (congestive heart failure) (HCC) Continue diuresis with Lasix  40 mg IV twice daily, Farxiga , Avapro .  Acute respiratory failure with hypoxia (HCC) Patient had respiratory distress requiring initial BiPAP.  Able to come off oxygen by late morning on 11/19.  Paroxysmal atrial fibrillation (HCC) EKG shows paced rhythm.  On  Coumadin .  Essential hypertension Holding amlodipine  with peripheral edema.  On IV Lasix .  Started on Avapro  and Farxiga .  History of pulmonary embolism On Coumadin   Myocardial injury Demand ischemia secondary to heart failure.  Overweight (BMI 25.0-29.9) BMI 26.3        Subjective: Patient little short of breath this morning.  She does not wear oxygen at home.  Patient admitted with CHF exacerbation.  Physical Exam: Vitals:   07/02/24 0442 07/02/24 0500 07/02/24 0731 07/02/24 1155  BP: (!) 141/57  (!) 152/63 122/60  Pulse: 61  60 61  Resp: 19  19 16   Temp: 97.9 F (36.6 C)  98 F (36.7 C) 98.5 F (36.9 C)  TempSrc:   Oral Oral  SpO2: 98%  100% 100%  Weight:  69.5 kg    Height:       Physical Exam HENT:     Head: Normocephalic.     Mouth/Throat:     Pharynx: No oropharyngeal exudate.  Eyes:     General: Lids are normal.     Conjunctiva/sclera: Conjunctivae normal.  Cardiovascular:     Rate and Rhythm: Normal rate and regular rhythm.     Heart sounds: Normal heart sounds, S1 normal and S2 normal.  Pulmonary:     Breath sounds: Examination of the right-lower field reveals decreased breath sounds and rales. Examination of the left-lower field reveals decreased breath sounds and rales. Decreased breath sounds and rales present. No wheezing or rhonchi.  Abdominal:     Palpations: Abdomen is soft.     Tenderness: There is no abdominal tenderness.  Musculoskeletal:  Right lower leg: No swelling.     Left lower leg: No swelling.  Skin:    General: Skin is warm.     Findings: No rash.  Neurological:     Mental Status: She is alert and oriented to person, place, and time.     Data Reviewed: Creatinine 1.02, electrolytes normal range, GFR 55, proBNP 4452, troponin borderline at 30, white blood cell count 7.4, hemoglobin 10.8, platelet count 227  Family Communication: updated sister  Disposition: Status is: Inpatient Remains inpatient appropriate because:  Continue IV diuresis  Planned Discharge Destination: Home    Time spent: 28 minutes  Author: Charlie Patterson, MD 07/02/2024 4:21 PM  For on call review [email or web address].

## 2024-07-02 NOTE — Telephone Encounter (Signed)
 Patient Product/process Development Scientist completed.    The patient is insured through [facility]. Patient has Medicare and is not eligible for a copay card, but may be able to apply for patient assistance or Medicare RX Payment Plan (Patient Must reach out to their plan, if eligible for payment plan), if available.    Ran test claim for Farxiga 10 mg and the current 30 day co-pay is $287.11 due to a deductible.   This test claim was processed through [facility]- copay amounts may vary at other pharmacies due to pharmacy/plan contracts, or as the patient moves through the different stages of their insurance plan.     Reyes Sharps, CPHT Pharmacy Technician Patient Advocate Specialist Lead [facility] Patient Advocate Team Direct Number: [phone]  Fax: [phone]

## 2024-07-02 NOTE — Consult Note (Addendum)
 Cardiology Consultation   Patient ID: KERINGTON HILDEBRANT MRN: [id number]; DOB: 1941-06-27  Admit date: 07/01/2024 Date of Consult: 07/02/2024  PCP:  Epifanio Alm SQUIBB, MD   [facility] Providers Cardiologist:  Lonni Hanson, MD  Electrophysiologist:  OLE ONEIDA HOLTS, MD    Patient Profile: Sarah Sweeney is a 83 y.o. 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 Josette.  History of Present Illness: Ms. Marchesi has a h/o unprovoked bilateral PE in 2013 on chronic coumadin . In July, 2020, she underwent screening colonoscopy and was noted to be in new onset Afib with slow ventricular response with rates in the 50s to 60s. She was asymptomatic. Echo 03/2019 showed LVEF 50-55%, severe asymmetric LVH of the apical wall consistent with apical hypertrophic CM, low normal RV function, mild RVH, mild BAE, mild to moderate MR.  Cardiac MRI in September 2020 confirmed apical hypertrophic cardiomyopathy.  She underwent cardioversion in December 2020.  Upon arrival for exercise stress test the following week she was found to be back in A-fib.  Upon follow-up in October 2022 the patient was noted to be in a flutter with slow ventricular response/junctional rhythm.  She was referred to EP for pacemaker consideration.  She was evaluated by Dr. Holts June 15, 2021.  7-day heart monitor showed 100% a flutter and several pauses.  She followed up with EP in December with continued complaints of fatigue with exertion and exertional lightheadedness and dizziness without syncope.  She underwent pacemaker placement in January 2023.  Patient did not tolerate Coreg  due to fatigue.  She did not tolerate amlodipine  due to lower leg edema.  The patient was last seen 04/25/2024 and was  feeling fairly well.  EKG showed a paced rhythm with underlying A-fib.  The patient presented to the ER 07/01/24 for shortness of breath and lower leg edema for a couple days.  day. She took lasix over the weekend, but this did not seem to help. She does not normally does not take lasix. EMS was called who reported O2 in the 80s and was placed on CPAP. She denies recent fever, cough. She also noted swelling in her legs.   In the ER bP 172/61, pulse 26bpm, 78bpm, 96% O2. Labs showed Hgb 10.8, BNP 4452. HS trop 29>30. INR 1.7. EKG showed V pacing. CXR was unremarkable. Chest CTA showed no acute PE, remote PE, moderate pulmonary edema with small bilateral pleural effusions.  The patient was given IV Lasix and admitted for further workup.  Past Medical History:  Diagnosis Date   Allergy    Apical variant hypertrophic cardiomyopathy (HCC)    Atrial fibrillation and flutter (HCC)    Diabetes mellitus without complication (HCC)    Erythrocytosis 03/02/2015   GERD (gastroesophageal reflux disease)    Headache    Hypertension    Pulmonary embolism (HCC) 2013   Coumadin  Therapy   Sleep apnea    CPAP   Symptomatic bradycardia    s/p single-chamber pacemaker (08/2021 - Dr. Holts)    Past Surgical History:  Procedure Laterality Date   APPENDECTOMY     CARDIOVERSION N/A 07/17/2019   Procedure: CARDIOVERSION;  Surgeon: Perla Evalene PARAS, MD;  Location: [facility] ORS;  Service: Cardiovascular;  Laterality: N/A;   CATARACT EXTRACTION W/PHACO Left 09/21/2020   Procedure: CATARACT  EXTRACTION PHACO AND INTRAOCULAR LENS PLACEMENT (IOC) LEFT 4.94 00:36.2;  Surgeon: Jaye Fallow, MD;  Location: [facility];  Service: Ophthalmology;  Laterality: Left;  sleep apnea   CATARACT EXTRACTION W/PHACO Right 10/05/2020   Procedure: CATARACT EXTRACTION PHACO AND INTRAOCULAR LENS PLACEMENT (IOC) RIGHT 4.44 00:34.3 ;  Surgeon: Jaye Fallow, MD;  Location: [facility];  Service: Ophthalmology;   Laterality: Right;  Diabetic - diet controlled   COLONOSCOPY WITH PROPOFOL  N/A 03/04/2019   Procedure: COLONOSCOPY WITH PROPOFOL ;  Surgeon: Toledo, Ladell POUR, MD;  Location: [facility] ENDOSCOPY;  Service: Gastroenterology;  Laterality: N/A;   PACEMAKER IMPLANT N/A 09/08/2021   Procedure: PACEMAKER IMPLANT;  Surgeon: Cindie Ole DASEN, MD;  Location: [facility] INVASIVE CV LAB;  Service: Cardiovascular;  Laterality: N/A;   TONSILLECTOMY       Home Medications:  Prior to Admission medications   Medication Sig Start Date End Date Taking? Authorizing Provider  acetaminophen  (TYLENOL ) 325 MG tablet Take 1-2 tablets (325-650 mg total) by mouth every 4 (four) hours as needed for mild pain. 09/09/21  Yes Lesia Ozell Barter, PA-C  alendronate (FOSAMAX) 70 MG tablet Take 70 mg by mouth once a week. 01/15/24  Yes [provider]  amLODipine  (NORVASC ) 2.5 MG tablet TAKE ONE TABLET BY MOUTH ONCE DAILY 11/14/23  Yes End, Lonni, MD  atorvastatin  (LIPITOR) 20 MG tablet TAKE 1 TABLET BY MOUTH DAILY 10/30/23  Yes End, Lonni, MD  Cyanocobalamin (B-12 PO) Take by mouth daily.   Yes [provider]  DENTA 5000 PLUS 1.1 % CREA dental cream Take 1 Application by mouth daily. 01/15/24  Yes [provider]  diclofenac  Sodium (VOLTAREN ) 1 % GEL Apply 1 application topically 2 (two) times daily as needed (pain).   Yes [provider]  furosemide (LASIX) 20 MG tablet Take 20 mg by mouth daily.   Yes [provider]  latanoprost (XALATAN) 0.005 % ophthalmic solution Place 1 drop into both eyes at bedtime. 02/02/14  Yes [provider]  metFORMIN (GLUCOPHAGE-XR) 500 MG 24 hr tablet Take 500 mg by mouth daily with breakfast.   Yes [provider]  olmesartan  (BENICAR ) 40 MG tablet TAKE ONE TABLET (40 MG) BY MOUTH ONCE DAILY 04/11/24  Yes End, Lonni, MD  omeprazole (PRILOSEC) 40 MG capsule Take 40 mg by mouth daily.  08/24/15  Yes [provider]   oxybutynin  (DITROPAN  XL) 15 MG 24 hr tablet Take 15 mg by mouth at bedtime.    Yes [provider]  Vitamin D, Ergocalciferol, (DRISDOL) 1.25 MG (50000 UNIT) CAPS capsule Take 50,000 Units by mouth once a week. 08/28/23  Yes [provider]  warfarin (COUMADIN ) 3 MG tablet Take 3 mg by mouth daily. Taking 2 mg Mon, Wed, Fri and 3 mg on Sun, Tu, Th, Sat 01/13/22  Yes [provider]    Scheduled Meds:  atorvastatin   20 mg Oral Daily   furosemide  40 mg Intravenous BID   insulin aspart  0-9 Units Subcutaneous TID WC   irbesartan   300 mg Oral Daily   oxybutynin   15 mg Oral QHS   pantoprazole   40 mg Oral Daily   sodium chloride  flush  3 mL Intravenous Q12H   warfarin  2 mg Oral ONCE-1600   Warfarin - Pharmacist Dosing Inpatient   Does not apply q1600   Continuous Infusions:  PRN Meds: acetaminophen , ondansetron  (ZOFRAN ) IV, sodium chloride  flush  Allergies:   No Known Allergies  Social History:   Social History  Socioeconomic History   Marital status: Single    Spouse name: Not on file   Number of children: Not on file   Years of education: Not on file   Highest education level: Not on file  Occupational History   Not on file  Tobacco Use   Smoking status: Never   Smokeless tobacco: Never  Vaping Use   Vaping status: Never Used  Substance and Sexual Activity   Alcohol use: No   Drug use: No   Sexual activity: Not Currently  Other Topics Concern   Not on file  Social History Narrative   Not on file   Social Drivers of Health   Financial Resource Strain: Low Risk  (05/19/2024)   Received from [facility]   Overall Financial Resource Strain (CARDIA)    Difficulty of Paying Living Expenses: Not hard at all  Food Insecurity: No Food Insecurity (07/01/2024)   Hunger Vital Sign    Worried About Running Out of Food in the Last Year: Never true    Ran Out of Food in the Last Year: Never true  Transportation Needs: No  Transportation Needs (07/01/2024)   PRAPARE - Administrator, Civil Service (Medical): No    Lack of Transportation (Non-Medical): No  Physical Activity: Not on file  Stress: Not on file  Social Connections: Unknown (07/01/2024)   Social Connection and Isolation Panel    Frequency of Communication with Friends and Family: More than three times a week    Frequency of Social Gatherings with Friends and Family: More than three times a week    Attends Religious Services: More than 4 times per year    Active Member of [employer] or Organizations: No    Attends Banker Meetings: Never    Marital Status: Patient declined  Catering Manager Violence: Not At Risk (07/01/2024)   Humiliation, Afraid, Rape, and Kick questionnaire    Fear of Current or Ex-Partner: No    Emotionally Abused: No    Physically Abused: No    Sexually Abused: No    Family History:    Family History  Problem Relation Age of Onset   Hypertension Mother    Breast cancer Maternal Aunt        60's   Cancer Maternal Uncle    Stroke Maternal Grandfather      ROS:  Please see the history of present illness.   All other ROS reviewed and negative.     Physical Exam/Data: Vitals:   07/02/24 0029 07/02/24 0442 07/02/24 0500 07/02/24 0731  BP: (!) 160/64 (!) 141/57  (!) 152/63  Pulse: 60 61  60  Resp: 20 19  19   Temp: 98 F (36.7 C) 97.9 F (36.6 C)  98 F (36.7 C)  TempSrc:    Oral  SpO2: 100% 98%  100%  Weight:   69.5 kg   Height:        Intake/Output Summary (Last 24 hours) at 07/02/2024 1024 Last data filed at 07/02/2024 0910 Gross per 24 hour  Intake 360 ml  Output 2200 ml  Net -1840 ml      07/02/2024    5:00 AM 07/01/2024    2:27 AM 04/25/2024   10:04 AM  Last 3 Weights  Weight (lbs) 153 lb 3.5 oz 154 lb 1.6 oz 151 lb 4 oz  Weight (kg) 69.5 kg 69.9 kg 68.607 kg     Body mass index is 26.3 kg/m.  General:  Well nourished,  well developed, in no acute distress HEENT:  normal Neck: no JVD Vascular: No carotid bruits; Distal pulses 2+ bilaterally Cardiac:  normal S1, S2; RRR; no murmur  Lungs: crackles at bases Abd: soft, nontender, no hepatomegaly  Ext: mild to mod lower leg edema Musculoskeletal:  No deformities, BUE and BLE strength normal and equal Skin: warm and dry  Neuro:  CNs 2-12 intact, no focal abnormalities noted Psych:  Normal affect   EKG:  The EKG was personally reviewed and demonstrates:  V paced rhythm, 71bpm underlying Afib Telemetry:  Telemetry was personally reviewed and demonstrates:  V paced rhythm with underlying Afib  Relevant CV Studies:  Echo 07/02/24  1. Left ventricular ejection fraction, by estimation, is 55 to 60%. The  left ventricle has normal function. The left ventricle has no regional  wall motion abnormalities. Left ventricular diastolic parameters are  indeterminate.   2. Right ventricular systolic function is normal. The right ventricular  size is normal. There is mildly elevated pulmonary artery systolic  pressure. The estimated right ventricular systolic pressure is 43.4 mmHg.   3. Left atrial size was mildly dilated.   4. The mitral valve is normal in structure. Mild mitral valve  regurgitation. No evidence of mitral stenosis.   5. Tricuspid valve regurgitation is mild to moderate.   6. The aortic valve is normal in structure. Aortic valve regurgitation is  not visualized. No aortic stenosis is present.   7. The inferior vena cava is dilated in size with >50% respiratory  variability, suggesting right atrial pressure of 8 mmHg.   Echo 2023 1. Left ventricular ejection fraction, by estimation, is 55 to 60%. The  left ventricle has normal function. The left ventricle has no regional  wall motion abnormalities. Left ventricular diastolic parameters were  normal.   2. Right ventricular systolic function is normal. The right ventricular  size is normal.   3. The mitral valve is grossly normal. No evidence  of mitral valve  regurgitation.   4. The aortic valve is grossly normal. Aortic valve regurgitation is not  visualized.   5. The inferior vena cava is normal in size with greater than 50%  respiratory variability, suggesting right atrial pressure of 3 mmHg.   Echo 2022 1. Left ventricular ejection fraction, by estimation, is 60 to 65%. The  left ventricle has normal function. The left ventricle has no regional  wall motion abnormalities. The left ventricular internal cavity size was  mildly dilated. There is mild to  moderate concentric left ventricular hypertrophy with severe apical  hypertrophy. Left ventricular diastolic parameters are indeterminate.   2. Right ventricular systolic function is normal. The right ventricular  size is normal. There is moderately elevated pulmonary artery systolic  pressure. The estimated right ventricular systolic pressure is 46.2 mmHg.   3. Left atrial size was mild to moderately dilated.   4. The mitral valve is normal in structure. Moderate mitral valve  regurgitation. No evidence of mitral stenosis.   5. The aortic valve is normal in structure. Aortic valve regurgitation is  not visualized. No aortic stenosis is present.   6. The inferior vena cava is normal in size with greater than 50%  respiratory variability, suggesting right atrial pressure of 3 mmHg.   7. Rhythm is atrial fib/flutter   Comparison(s): LVEF 50-55%, apical hypertrophic cardiomyopathy.   cMRI 2020 IMPRESSION: 1. Hypertrophy of LV apex consistent with apical hypertrophic cardiomyopathy   2. Late gadolinium enhancement at the inferior RV insertion site and  patchy LGE at the apex. This is consistent with apical HCM   3.  Normal LV size and systolic function (EF 63%)   4.  Normal RV size and systolic function (EF 55%)   5. Small to moderate circumferential pericardial effusion, measuring up to 10mm adjacent to LV free wall   6.  Mild mitral regurgitation (regurgitant  fraction 18%)    MPI 2020 Narrative & Impression  Pharmacological myocardial perfusion imaging study with no significant Ischemia Small region mild fixed defect mid anteroseptal wall, consistent with breast attenuation artifact  Normal wall motion, EF estimated at 49% (depressed ejection fraction likely secondary to GI uptake artifact) No EKG changes concerning for ischemia at peak stress or in recovery. CT attenuation corrected images showing mild coronary calcification, mild aortic arch atherosclerosis Low risk scan   Echo 2020 1. The left ventricle has low normal systolic function, with an ejection  fraction of 50-55%. The cavity size was mild to moderately dilated. There  is severe asymmetric left ventricular hypertrophy of the apical wall.  Findings are consistent with apical  hypertrophic cardiomyopathy. Left ventricular diastolic function could not  be evaluated secondary to atrial fibrillation.   2. The right ventricle has low normal systolic function. The cavity was  mildly enlarged. There is no increase in right ventricular wall thickness.  Right ventricular systolic pressure could not be assessed.   3. Left atrial size was mildly dilated.   4. Right atrial size was mildly dilated.   5. Mitral valve regurgitation is mild to moderate by color flow Doppler.   6. The tricuspid valve is grossly normal.   7. The aortic valve is tricuspid.   8. The aorta is normal in size and structure.   9. The interatrial septum was not well visualized.    Laboratory Data: High Sensitivity Troponin:  No results for input(s): TROPONINIHS in the last 720 hours.   Chemistry Recent Labs  Lab 07/01/24 0229 07/02/24 0432  NA 137 142  K 4.1 3.8  CL 101 102  CO2 26 32  GLUCOSE 141* 93  BUN 13 13  CREATININE 0.88 1.02*  CALCIUM  8.4* 8.6*  GFRNONAA >60 55*  ANIONGAP 10 8    No results for input(s): PROT, ALBUMIN, AST, ALT, ALKPHOS, BILITOT in the last 168 hours. Lipids No  results for input(s): CHOL, TRIG, HDL, LABVLDL, LDLCALC, CHOLHDL in the last 168 hours.  Hematology Recent Labs  Lab 07/01/24 0229  WBC 7.4  RBC 3.77*  HGB 10.8*  HCT 34.6*  MCV 91.8  MCH 28.6  MCHC 31.2  RDW 14.6  PLT 227   Thyroid  No results for input(s): TSH, FREET4 in the last 168 hours.  BNP Recent Labs  Lab 07/01/24 0229  PROBNP 4,452.0*    DDimer No results for input(s): DDIMER in the last 168 hours.  Radiology/Studies:  DG Chest 1 View Result Date: 07/02/2024 EXAM: 1 VIEW(S) XRAY OF THE CHEST 07/02/2024 07:14:16 AM COMPARISON: 02/14/2022 CLINICAL HISTORY: CHF (congestive heart failure) (HCC) FINDINGS: LINES, TUBES AND DEVICES: Stable left-sided pacemaker. LUNGS AND PLEURA: No focal pulmonary opacity. No pleural effusion. No pneumothorax. HEART AND MEDIASTINUM: Stable cardiomediastinal silhouette. Stable left-sided pacemaker. BONES AND SOFT TISSUES: No acute osseous abnormality. IMPRESSION: 1. Stable cardiomediastinal silhouette. Electronically signed by: Lynwood Seip MD 07/02/2024 07:19 AM EST RP Workstation: [id number]   ECHOCARDIOGRAM COMPLETE Result Date: 07/02/2024    ECHOCARDIOGRAM REPORT   Patient Name:   Sarah Sweeney Date of Exam: 07/01/2024 Medical Rec #:  [id number]  Height:       64.0 in Accession #:    [id number]      Weight:       154.1 lb Date of Birth:  27-Jul-1941       BSA:          1.751 m Patient Age:    83 years        BP:           157/61 mmHg Patient Gender: F               HR:           61 bpm. Exam Location:  [facility] Procedure: 2D Echo, Cardiac Doppler and Color Doppler (Both Spectral and Color            Flow Doppler were utilized during procedure). Indications:     I50.31 Acute Diastolic CHF  History:         Patient has prior history of Echocardiogram examinations, most                  recent 07/21/2021. Arrythmias:Atrial Fibrillation and Atrial                  Flutter; Risk Factors:Diabetes, Hypertension and Sleep Apnea.                   Pulmonary Embolism.  Sonographer:     Carl Coma RDCS Referring Phys:  [id number] CORT ONEIDA MANA Diagnosing Phys: Evalene Lunger MD IMPRESSIONS  1. Left ventricular ejection fraction, by estimation, is 55 to 60%. The left ventricle has normal function. The left ventricle has no regional wall motion abnormalities. Left ventricular diastolic parameters are indeterminate.  2. Right ventricular systolic function is normal. The right ventricular size is normal. There is mildly elevated pulmonary artery systolic pressure. The estimated right ventricular systolic pressure is 43.4 mmHg.  3. Left atrial size was mildly dilated.  4. The mitral valve is normal in structure. Mild mitral valve regurgitation. No evidence of mitral stenosis.  5. Tricuspid valve regurgitation is mild to moderate.  6. The aortic valve is normal in structure. Aortic valve regurgitation is not visualized. No aortic stenosis is present.  7. The inferior vena cava is dilated in size with >50% respiratory variability, suggesting right atrial pressure of 8 mmHg. FINDINGS  Left Ventricle: Left ventricular ejection fraction, by estimation, is 55 to 60%. The left ventricle has normal function. The left ventricle has no regional wall motion abnormalities. Strain was performed and the global longitudinal strain is indeterminate. The left ventricular internal cavity size was normal in size. There is no left ventricular hypertrophy. Left ventricular diastolic parameters are indeterminate. Right Ventricle: The right ventricular size is normal. No increase in right ventricular wall thickness. Right ventricular systolic function is normal. There is mildly elevated pulmonary artery systolic pressure. The tricuspid regurgitant velocity is 2.89  m/s, and with an assumed right atrial pressure of 10 mmHg, the estimated right ventricular systolic pressure is 43.4 mmHg. Left Atrium: Left atrial size was mildly dilated. Right Atrium: Right atrial size was  normal in size. Pericardium: There is no evidence of pericardial effusion. Mitral Valve: The mitral valve is normal in structure. Mild mitral valve regurgitation. No evidence of mitral valve stenosis. Tricuspid Valve: The tricuspid valve is normal in structure. Tricuspid valve regurgitation is mild to moderate. No evidence of tricuspid stenosis. Aortic Valve: The aortic valve is normal in structure. Aortic valve regurgitation is not visualized. No aortic stenosis is  present. Pulmonic Valve: The pulmonic valve was normal in structure. Pulmonic valve regurgitation is not visualized. No evidence of pulmonic stenosis. Aorta: The aortic root is normal in size and structure. Venous: The inferior vena cava is dilated in size with greater than 50% respiratory variability, suggesting right atrial pressure of 8 mmHg. IAS/Shunts: No atrial level shunt detected by color flow Doppler. Additional Comments: 3D was performed not requiring image post processing on an independent workstation and was indeterminate.  LEFT VENTRICLE PLAX 2D LVIDd:         5.10 cm   Diastology LVIDs:         3.60 cm   LV e' medial:    5.76 cm/s LV PW:         0.90 cm   LV E/e' medial:  15.0 LV IVS:        1.10 cm   LV e' lateral:   8.70 cm/s LVOT diam:     1.90 cm   LV E/e' lateral: 9.9 LV SV:         59 LV SV Index:   34 LVOT Area:     2.84 cm  RIGHT VENTRICLE             IVC RV Basal diam:  4.20 cm     IVC diam: 2.20 cm RV S prime:     11.00 cm/s TAPSE (M-mode): 2.0 cm LEFT ATRIUM             Index        RIGHT ATRIUM           Index LA diam:        4.70 cm 2.68 cm/m   RA Area:     16.10 cm LA Vol (A2C):   55.3 ml 31.58 ml/m  RA Volume:   47.30 ml  27.01 ml/m LA Vol (A4C):   76.4 ml 43.63 ml/m LA Biplane Vol: 66.7 ml 38.09 ml/m  AORTIC VALVE LVOT Vmax:   102.10 cm/s LVOT Vmean:  62.850 cm/s LVOT VTI:    0.209 m  AORTA Ao Root diam: 3.10 cm Ao Asc diam:  3.50 cm MITRAL VALVE               TRICUSPID VALVE MV Area (PHT): 5.20 cm    TR Peak grad:    33.4 mmHg MV Decel Time: 146 msec    TR Vmax:        289.00 cm/s MV E velocity: 86.20 cm/s MV A velocity: 43.00 cm/s  SHUNTS MV E/A ratio:  2.00        Systemic VTI:  0.21 m                            Systemic Diam: 1.90 cm Evalene Lunger MD Electronically signed by Evalene Lunger MD Signature Date/Time: 07/02/2024/6:29:04 AM    Final    CT Angio Chest PE W and/or Wo Contrast Result Date: 07/01/2024 EXAM: CTA CHEST 07/01/2024 03:02:56 AM TECHNIQUE: CTA of the chest was performed without and with the administration of 75 mL of iohexol (OMNIPAQUE) 350 MG/ML injection. Multiplanar reformatted images are provided for review. MIP images are provided for review. Automated exposure control, iterative reconstruction, and/or weight based adjustment of the mA/kV was utilized to reduce the radiation dose to as low as reasonably achievable. COMPARISON: None available. CLINICAL HISTORY: History of PE, high risk, here with shortness of breath and sats of 84% on room air. FINDINGS:  PULMONARY ARTERIES: Pulmonary arteries are adequately opacified for evaluation. Small web noted within the right lower lobar pulmonary artery keeping with remote pulmonary embolism. No acute pulmonary embolus identified. Main pulmonary artery is normal in caliber. MEDIASTINUM: Equivocal cardiac size within normal limits. Left subclavian single lead pacemaker in place with its lead within the intraventricular septum. No pericardial effusion. Mild multivessel coronary artery calcification. Mild atherosclerotic calcification within the thoracic aorta. No aortic aneurysm. The thyroid  is unremarkable. LYMPH NODES: Shotty mediastinal adenopathy nonspecific which limits measuring up to 12 mm in short axis diameter. No hilar or axillary lymphadenopathy. LUNGS AND PLEURA: There is extensive patchy bilateral ground-glass pulmonary infiltrate with associated smooth interlobular septal thickening in keeping with moderate pulmonary edema. Small bilateral pleural  effusions are present. The findings, together, may reflect changes of moderate pulmonary edema. 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 Similar AP narrowing of the bronchus intermedius suggests some bronchial in this location. No pneumothorax. UPPER ABDOMEN: Limited images of the upper abdomen are unremarkable. SOFT TISSUES AND BONES: Osseous structures are age appropriate. No acute soft tissue abnormality. IMPRESSION: 1. No acute pulmonary embolism. Small web in the right lower lobar pulmonary artery consistent with remote pulmonary embolism 2. Extensive patchy bilateral ground-glass opacities with smooth interlobular septal thickening, consistent with moderate pulmonary edema, with small bilateral pleural effusions. Altogether, the findings are in keeping with changes of moderate cardiogenic failure. 3. 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  Patient initially required BiPAP, now on nasal cannula at 2 L O2 - BNP elevated to the 4000's.  Chest CT consistent with CHF, no PE - Started on IV Lasix  40 mg twice daily - Net urine output -2.5L - Echocardiogram showed LVEF 55 to 60%, mildly elevated pulmonary artery systolic pressure, mild MR, mild to moderate TR. - Patient did not tolerate Coreg  due to fatigue. can trial low-dose metoprolol -Continue irbesartan  - Continue with diuresis.  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

## 2024-07-02 NOTE — Plan of Care (Signed)

## 2024-07-02 NOTE — Assessment & Plan Note (Signed)
 On Coumadin

## 2024-07-02 NOTE — Assessment & Plan Note (Signed)
 EKG shows paced rhythm.  On Coumadin .

## 2024-07-02 NOTE — Assessment & Plan Note (Signed)
 Demand ischemia secondary to heart failure.

## 2024-07-02 NOTE — Assessment & Plan Note (Addendum)
 Patient initially had respiratory distress requiring initial BiPAP.  As per nursing staff she become hypoxic with walking on room air down to 87% yesterday.  Continue trying to get off oxygen on a daily basis.

## 2024-07-02 NOTE — Hospital Course (Addendum)
 83 y.o. 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   Symptoms started about 1 week ago, patient started noticed increasing bilateral ankle swelling, and she gained about 7 pounds over 7 days.  Since Saturday, she started to feel increasing exertional dyspnea, she took as needed Lasix  for weight gain and she took Lasix  last Saturday and Sunday with some improvement however last night her symptoms became worse and could not lie flat because of shortness of breath and came to ED.  She denies any cough no fever or chills.  EMS arrived at her home and found her O2 saturation 84% on room air, and patient was found to speak in broken sentences and was placed on CPAP. ED Course: Afebrile, borderline bradycardia heart rate upper 50s, O2 saturation 98% on BiPAP.  Chest x-ray showed bilateral pulmonary congestion.  CTA showed right lower lobe chronic PE but no acute PE, and bilateral ground glass like changes compatible with pulmonary edema.  Blood work showed BUN 13 creatinine 0.8 bicarb 26K 4.1.  VBG 7.4/48/29.  11/19.  Patient still short of breath this morning.  Patient does not wear oxygen at home.  Patient admitted with CHF.  Continue IV Lasix . 11/20.  Continue IV Lasix . 11/21.  Cardiology increase Lasix  to 80 mg IV twice daily.  Still dropped saturations with ambulation today to 87%.

## 2024-07-02 NOTE — Assessment & Plan Note (Addendum)
 Patient was diuresed with Lasix  40 mg IV twice daily and then 1 day of 80 mg twice daily.  Cardiology switched to oral Lasix  daily for discharge home.  Continue Olmesartan . Spironolactone  prescribed.  I had to prescribe Jardiance  into a mail away pharmacy secondary to prohibitive cost at local pharmacies.  Follow-up at CHF clinic.

## 2024-07-02 NOTE — Assessment & Plan Note (Addendum)
 Holding amlodipine  with peripheral edema.  On IV Lasix .  Started on Avapro , Aldactone  and Farxiga .

## 2024-07-02 NOTE — Consult Note (Addendum)
 PHARMACY - ANTICOAGULATION CONSULT NOTE  Pharmacy Consult for Warfarin Indication: atrial fibrillation and pulmonary embolus  No Known Allergies  Patient Measurements: Height: 5' 4 (162.6 cm) Weight: 69.5 kg (153 lb 3.5 oz) IBW/kg (Calculated) : 54.7 HEPARIN  DW (KG): 68.8  Vital Signs: Temp: 98 F (36.7 C) (11/19 0731) Temp Source: Oral (11/19 0731) BP: 152/63 (11/19 0731) Pulse Rate: 60 (11/19 0731)  Labs: Recent Labs    07/01/24 0229 07/02/24 0432  HGB 10.8*  --   HCT 34.6*  --   PLT 227  --   LABPROT 20.8* 24.6*  INR 1.7* 2.1*  CREATININE 0.88 1.02*    Estimated Creatinine Clearance: 40.7 mL/min (A) (by C-G formula based on SCr of 1.02 mg/dL (H)).   Medical History: Past Medical History:  Diagnosis Date   Allergy    Apical variant hypertrophic cardiomyopathy (HCC)    Atrial fibrillation and flutter (HCC)    Diabetes mellitus without complication (HCC)    Erythrocytosis 03/02/2015   GERD (gastroesophageal reflux disease)    Headache    Hypertension    Pulmonary embolism (HCC) 2013   Coumadin  Therapy   Sleep apnea    CPAP   Symptomatic bradycardia    s/p single-chamber pacemaker (08/2021 - Dr. Cindie)    Medications:  Scheduled:   atorvastatin   20 mg Oral Daily   furosemide   40 mg Intravenous BID   insulin  aspart  0-9 Units Subcutaneous TID WC   irbesartan   300 mg Oral Daily   oxybutynin   15 mg Oral QHS   pantoprazole   40 mg Oral Daily   sodium chloride  flush  3 mL Intravenous Q12H   Warfarin - Pharmacist Dosing Inpatient   Does not apply q1600   Infusions:   sodium chloride       Assessment: 83 y.o. female with chronic Afib and history of PE, PTA anticoagulation with warfarin. Last outpatient INR was 2.4 on 11/13. INR at admission 11/18 was 1.7. Patient was given 4 mg dose of warfarin after 11/18 INR. INR today is 2.1. Plan to resume PTA dosing today since INR is now therapeutic.    PTA warfarin dose was 2 mg MWF and 3 mg all other  days.  Goal of Therapy:  INR 2-3 Monitor platelets by anticoagulation protocol: Yes   Plan:  Give 2 mg tonight  Recheck INR tomorrow AM   Signe Platt, PharmD Candidate 07/02/2024,7:35 AM

## 2024-07-02 NOTE — Assessment & Plan Note (Addendum)
 BMI 25.98

## 2024-07-03 DIAGNOSIS — J9601 Acute respiratory failure with hypoxia: Secondary | ICD-10-CM | POA: Diagnosis not present

## 2024-07-03 DIAGNOSIS — N1831 Chronic kidney disease, stage 3a: Secondary | ICD-10-CM

## 2024-07-03 DIAGNOSIS — I10 Essential (primary) hypertension: Secondary | ICD-10-CM | POA: Diagnosis not present

## 2024-07-03 DIAGNOSIS — I480 Paroxysmal atrial fibrillation: Secondary | ICD-10-CM | POA: Diagnosis not present

## 2024-07-03 DIAGNOSIS — R7989 Other specified abnormal findings of blood chemistry: Secondary | ICD-10-CM | POA: Diagnosis not present

## 2024-07-03 DIAGNOSIS — I5033 Acute on chronic diastolic (congestive) heart failure: Secondary | ICD-10-CM | POA: Diagnosis not present

## 2024-07-03 DIAGNOSIS — I4821 Permanent atrial fibrillation: Secondary | ICD-10-CM | POA: Diagnosis not present

## 2024-07-03 LAB — PROTIME-INR
INR: 2.1 — ABNORMAL HIGH (ref 0.8–1.2)
Prothrombin Time: 24.9 s — ABNORMAL HIGH (ref 11.4–15.2)

## 2024-07-03 LAB — BASIC METABOLIC PANEL WITH GFR
Anion gap: 11 (ref 5–15)
BUN: 17 mg/dL (ref 8–23)
CO2: 33 mmol/L — ABNORMAL HIGH (ref 22–32)
Calcium: 8.6 mg/dL — ABNORMAL LOW (ref 8.9–10.3)
Chloride: 98 mmol/L (ref 98–111)
Creatinine, Ser: 1.02 mg/dL — ABNORMAL HIGH (ref 0.44–1.00)
GFR, Estimated: 55 mL/min — ABNORMAL LOW (ref >60)
Glucose, Bld: 91 mg/dL (ref 70–99)
Potassium: 3.7 mmol/L (ref 3.5–5.1)
Sodium: 141 mmol/L (ref 135–145)

## 2024-07-03 LAB — GLUCOSE, CAPILLARY
Glucose-Capillary: 101 mg/dL — ABNORMAL HIGH (ref 70–99)
Glucose-Capillary: 99 mg/dL (ref 70–99)
Glucose-Capillary: 125 mg/dL — ABNORMAL HIGH (ref 70–99)
Glucose-Capillary: 191 mg/dL — ABNORMAL HIGH (ref 70–99)

## 2024-07-03 MED ORDER — SPIRONOLACTONE 25 MG PO TABS
25.0000 mg | ORAL_TABLET | Freq: Every day | ORAL | Status: DC
  Administered 2024-07-03 – 2024-07-06 (×4): 25 mg via ORAL
  Filled 2024-07-03 (×4): qty 1

## 2024-07-03 MED ORDER — POTASSIUM CHLORIDE CRYS ER 20 MEQ PO TBCR
40.0000 meq | EXTENDED_RELEASE_TABLET | Freq: Once | ORAL | Status: AC
  Administered 2024-07-03: 40 meq via ORAL
  Filled 2024-07-03: qty 2

## 2024-07-03 MED ORDER — WARFARIN SODIUM 3 MG PO TABS
3.0000 mg | ORAL_TABLET | Freq: Once | ORAL | Status: AC
  Administered 2024-07-03: 3 mg via ORAL
  Filled 2024-07-03: qty 1

## 2024-07-03 NOTE — Progress Notes (Signed)
 Progress Note   Patient: Sarah Sweeney FMW:[id number] DOB: 08-26-1940 DOA: 07/01/2024     2 DOS: the patient was seen and examined on 07/03/2024   Brief hospital course: 83 y.o. 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   Symptoms started about 1 week ago, patient started noticed increasing bilateral ankle swelling, and she gained about 7 pounds over 7 days.  Since Saturday, she started to feel increasing exertional dyspnea, she took as needed Lasix for weight gain and she took Lasix last Saturday and Sunday with some improvement however last night her symptoms became worse and could not lie flat because of shortness of breath and came to ED.  She denies any cough no fever or chills.  EMS arrived at her home and found her O2 saturation 84% on room air, and patient was found to speak in broken sentences and was placed on CPAP. ED Course: Afebrile, borderline bradycardia heart rate upper 50s, O2 saturation 98% on BiPAP.  Chest x-ray showed bilateral pulmonary congestion.  CTA showed right lower lobe chronic PE but no acute PE, and bilateral ground glass like changes compatible with pulmonary edema.  Blood work showed BUN 13 creatinine 0.8 bicarb 26K 4.1.  VBG 7.4/48/29.  11/19.  Patient still short of breath this morning.  Patient does not wear oxygen at home.  Patient admitted with CHF.  Continue IV Lasix.  Assessment and Plan: * Acute on chronic diastolic CHF (congestive heart failure) (HCC) Continue diuresis with Lasix 40 mg IV twice daily, Farxiga, Avapro .  Acute respiratory failure with hypoxia [facility]) Patient had respiratory distress requiring initial BiPAP.  As per nursing staff they become hypoxic with walking on room air and was placed back on oxygen.  Paroxysmal atrial fibrillation  (HCC) EKG shows paced rhythm.  On Coumadin .  Essential hypertension Holding amlodipine  with peripheral edema.  On IV Lasix.  Started on Avapro  and Farxiga.  History of pulmonary embolism On Coumadin   Myocardial injury Demand ischemia secondary to heart failure.  CKD stage 3a, GFR 45-59 ml/min (HCC) Monitor with diuresis  Overweight (BMI 25.0-29.9) BMI 26.26        Subjective: Patient feeling little bit better.  Still has some shortness of breath.  Admitted with CHF exacerbation  Physical Exam: Vitals:   07/03/24 0632 07/03/24 0726 07/03/24 1157 07/03/24 1547  BP:  (!) 144/59 (!) 135/57 (!) 143/57  Pulse:  63 68 60  Resp:  18 19 19   Temp:  98.3 F (36.8 C) 98.2 F (36.8 C) 98.4 F (36.9 C)  TempSrc:  Oral    SpO2: 100% 98% 99% 98%  Weight:      Height:       Physical Exam HENT:     Head: Normocephalic.     Mouth/Throat:     Pharynx: No oropharyngeal exudate.  Eyes:     General: Lids are normal.     Conjunctiva/sclera: Conjunctivae normal.  Cardiovascular:     Rate and Rhythm: Normal rate and regular rhythm.     Heart sounds: Normal heart sounds, S1 normal and S2 normal.  Pulmonary:     Breath sounds: Examination of the right-lower field reveals decreased breath sounds and rales. Examination of the left-lower field reveals decreased breath sounds and rales. Decreased breath sounds and rales present. No wheezing or rhonchi.  Abdominal:     Palpations: Abdomen is soft.  Tenderness: There is no abdominal tenderness.  Musculoskeletal:     Right lower leg: No swelling.     Left lower leg: No swelling.  Skin:    General: Skin is warm.     Findings: No rash.  Neurological:     Mental Status: She is alert and oriented to person, place, and time.     Data Reviewed: Creatinine 1.02, CO2 33, INR 2.1  Family Communication: Tried to reach sister.  Disposition: Status is: Inpatient Remains inpatient appropriate because: continue iv duiresis  Planned  Discharge Destination: Home    Time spent: 28 minutes  Author: Charlie Patterson, MD 07/03/2024 5:59 PM  For on call review [email or web address].

## 2024-07-03 NOTE — Plan of Care (Signed)

## 2024-07-03 NOTE — Progress Notes (Signed)
 SATURATION QUALIFICATIONS: (This note is used to comply with regulatory documentation for home oxygen)  Patient Saturations on Room Air at Rest = 94%  Patient Saturations on Room Air while Ambulating = 86%  Patient Saturations on 2 Liters of oxygen while Ambulating = 93%  Please briefly explain why patient needs home oxygen:  Pt qualifies for home oxygen. Did feel sob while walking

## 2024-07-03 NOTE — Assessment & Plan Note (Deleted)
 Monitor with diuresis.  Last creatinine 1.02 with a GFR 55.

## 2024-07-03 NOTE — Consult Note (Addendum)
 PHARMACY - ANTICOAGULATION CONSULT NOTE  Pharmacy Consult for Warfarin Indication: atrial fibrillation  No Known Allergies  Patient Measurements: Height: 5' 4 (162.6 cm) Weight: 69.4 kg (153 lb) IBW/kg (Calculated) : 54.7 HEPARIN  DW (KG): 68.8  Vital Signs: Temp: 98.3 F (36.8 C) (11/20 0726) Temp Source: Oral (11/20 0726) BP: 144/59 (11/20 0726) Pulse Rate: 63 (11/20 0726)  Labs: Recent Labs    07/01/24 0229 07/02/24 0432 07/03/24 0347  HGB 10.8*  --   --   HCT 34.6*  --   --   PLT 227  --   --   LABPROT 20.8* 24.6* 24.9*  INR 1.7* 2.1* 2.1*  CREATININE 0.88 1.02* 1.02*    Estimated Creatinine Clearance: 40.7 mL/min (A) (by C-G formula based on SCr of 1.02 mg/dL (H)).   Medical History: Past Medical History:  Diagnosis Date   Allergy    Apical variant hypertrophic cardiomyopathy (HCC)    Atrial fibrillation and flutter (HCC)    Diabetes mellitus without complication (HCC)    Erythrocytosis 03/02/2015   GERD (gastroesophageal reflux disease)    Headache    Hypertension    Pulmonary embolism (HCC) 2013   Coumadin  Therapy   Sleep apnea    CPAP   Symptomatic bradycardia    s/p single-chamber pacemaker (08/2021 - Dr. Cindie)    Medications:  Medications Prior to Admission  Medication Sig Dispense Refill Last Dose/Taking   acetaminophen  (TYLENOL ) 325 MG tablet Take 1-2 tablets (325-650 mg total) by mouth every 4 (four) hours as needed for mild pain.   Taking As Needed   alendronate (FOSAMAX) 70 MG tablet Take 70 mg by mouth once a week.   Taking   amLODipine  (NORVASC ) 2.5 MG tablet TAKE ONE TABLET BY MOUTH ONCE DAILY 90 tablet 3 06/30/2024   atorvastatin  (LIPITOR) 20 MG tablet TAKE 1 TABLET BY MOUTH DAILY 90 tablet 3 06/30/2024   Cyanocobalamin (B-12 PO) Take by mouth daily.   06/30/2024   DENTA 5000 PLUS 1.1 % CREA dental cream Take 1 Application by mouth daily.   Taking   diclofenac  Sodium (VOLTAREN ) 1 % GEL Apply 1 application topically 2 (two) times  daily as needed (pain).   Taking As Needed   furosemide (LASIX) 20 MG tablet Take 20 mg by mouth daily.   Taking   latanoprost (XALATAN) 0.005 % ophthalmic solution Place 1 drop into both eyes at bedtime.   06/29/2024   metFORMIN (GLUCOPHAGE-XR) 500 MG 24 hr tablet Take 500 mg by mouth daily with breakfast.   06/30/2024   olmesartan  (BENICAR ) 40 MG tablet TAKE ONE TABLET (40 MG) BY MOUTH ONCE DAILY 30 tablet 3 06/30/2024   omeprazole (PRILOSEC) 40 MG capsule Take 40 mg by mouth daily.    06/30/2024   oxybutynin  (DITROPAN  XL) 15 MG 24 hr tablet Take 15 mg by mouth at bedtime.    06/29/2024   Vitamin D, Ergocalciferol, (DRISDOL) 1.25 MG (50000 UNIT) CAPS capsule Take 50,000 Units by mouth once a week.   Taking   warfarin (COUMADIN ) 3 MG tablet Take 3 mg by mouth daily. Taking 2 mg Mon, Wed, Fri and 3 mg on Sun, Tu, Th, Sat   06/30/2024   Scheduled:   atorvastatin   20 mg Oral Daily   dapagliflozin propanediol  10 mg Oral Daily   furosemide  40 mg Intravenous BID   insulin aspart  0-9 Units Subcutaneous TID WC   irbesartan   300 mg Oral Daily   oxybutynin   15 mg Oral QHS  pantoprazole   40 mg Oral Daily   sodium chloride  flush  3 mL Intravenous Q12H   spironolactone  25 mg Oral Daily   Warfarin - Pharmacist Dosing Inpatient   Does not apply q1600   Infusions:  PRN: acetaminophen , ondansetron  (ZOFRAN ) IV, sodium chloride  flush  Assessment: 83 y.o. female with chronic Afib and history of PE (05/2016), PTA anticoagulation with warfarin. CHADSVASC score of 7 (>75, female, HTN, PE hx, DM) indicates this patient for continuous anticoagultion. No signs of bleeding noted.  INR 2.1 today is therapeutic. Given 2 mg yesterday. Can restart PTA dosing today.   PTA warfarin dose was 2 mg on Mondays, 3 mg on all other days - confirmed with patient on 07/03/24.  Current volume overload and diuresis with IV lasix may cause a decrease in INR, continue to monitor INR daily while inpatient.   Goal of  Therapy:  INR 2-3 Monitor platelets by anticoagulation protocol: Yes   Plan:  Give 3 mg tonight  Recheck INR tomorrow AM   Signe Platt, PharmD Candidate  07/03/2024,10:13 AM

## 2024-07-03 NOTE — TOC CM/SW Note (Signed)
 Transition of Care [facility]) - Inpatient Brief Assessment   Patient Details  Name: Sarah Sweeney MRN: [id number] Date of Birth: 03-24-1941  Transition of Care [facility]) CM/SW Contact:    Shasta DELENA Daring, RN Phone Number: 07/03/2024, 10:36 AM   Clinical Narrative: RNCM reviewed chart. Patient on acute oxygen. Will follow for discharge needs.   Transition of Care Asessment: Insurance and Status: Insurance coverage has been reviewed Patient has primary care physician: Yes Home environment has been reviewed: apartment Prior level of function:: Not assessed Prior/Current Home Services: No current home services Social Drivers of Health Review: SDOH reviewed no interventions necessary Readmission risk has been reviewed: Yes Transition of care needs: no transition of care needs at this time

## 2024-07-03 NOTE — Plan of Care (Signed)
   Problem: Education: Goal: Knowledge of General Education information will improve Description: Including pain rating scale, medication(s)/side effects and non-pharmacologic comfort measures Outcome: Progressing   Problem: Clinical Measurements: Goal: Respiratory complications will improve Outcome: Progressing

## 2024-07-03 NOTE — Care Management Important Message (Signed)
 Important Message  Patient Details  Name: Sarah Sweeney MRN: [id number] Date of Birth: [birth year]   Important Message Given:  Yes - Medicare IM     Rojelio [name] 07/03/2024, 3:11 PM

## 2024-07-03 NOTE — Progress Notes (Signed)
 Heart Failure Nurse Navigator Progress Note  PCP: Epifanio Alm SQUIBB, MD PCP-Cardiologist: Lonni Hanson, MD Admission Diagnosis: Shortness of breath Acute respiratory failure with hypoxia [facility]) Admitted from: Home via EMS  Presentation:   Sarah Sweeney is a 83 y.o. female who presented with shortness of breath. An initial O2 sat was 84% and placed on 2L [place] O2. She usually does not wear O2. Patient was unable to speak in full sentences. She also reports increased swelling in legs but no calf  tenderness. She has a history of hypertrophic cardiomyopathy, atrial fibrillation, hypertension, diabetes, PE on Coumadin  and is compliant, symptomatic bradycardia status post pacemaker.  Pro-BNP-4,452.  HS-Troponin-30. Chest CT- No PE, moderate pulmonary edema with small bilateral pleural effusions, findings changes of moderate cardiogenic failure.    ECHO/ LVEF: 55-60%  Clinical Course:  Past Medical History:  Diagnosis Date   Allergy    Apical variant hypertrophic cardiomyopathy (HCC)    Atrial fibrillation and flutter (HCC)    Diabetes mellitus without complication (HCC)    Erythrocytosis 03/02/2015   GERD (gastroesophageal reflux disease)    Headache    Hypertension    Pulmonary embolism (HCC) 2013   Coumadin  Therapy   Sleep apnea    CPAP   Symptomatic bradycardia    s/p single-chamber pacemaker (08/2021 - Dr. Cindie)     Social History   Socioeconomic History   Marital status: Single    Spouse name: Not on file   Number of children: Not on file   Years of education: Not on file   Highest education level: Not on file  Occupational History   Not on file  Tobacco Use   Smoking status: Never   Smokeless tobacco: Never  Vaping Use   Vaping status: Never Used  Substance and Sexual Activity   Alcohol use: No   Drug use: No   Sexual activity: Not Currently  Other Topics Concern   Not on file  Social History Narrative   Not on file   Social Drivers of Health    Financial Resource Strain: Low Risk  (05/19/2024)   Received from [facility]   Overall Financial Resource Strain (CARDIA)    Difficulty of Paying Living Expenses: Not hard at all  Food Insecurity: No Food Insecurity (07/01/2024)   Hunger Vital Sign    Worried About Running Out of Food in the Last Year: Never true    Ran Out of Food in the Last Year: Never true  Transportation Needs: No Transportation Needs (07/01/2024)   PRAPARE - Administrator, Civil Service (Medical): No    Lack of Transportation (Non-Medical): No  Physical Activity: Not on file  Stress: Not on file  Social Connections: Unknown (07/01/2024)   Social Connection and Isolation Panel    Frequency of Communication with Friends and Family: More than three times a week    Frequency of Social Gatherings with Friends and Family: More than three times a week    Attends Religious Services: More than 4 times per year    Active Member of [employer] or Organizations: No    Attends Engineer, Structural: Never    Marital Status: Patient declined   Water Engineer and Provision:  Detailed education and instructions provided on heart failure disease management including the following:  Signs and symptoms of Heart Failure When to call the physician Importance of daily weights Low sodium diet Fluid restriction Medication management Anticipated future follow-up appointments  Patient education given on  each of the above topics.  Patient acknowledges understanding via teach back method and acceptance of all instructions.  Education Materials:  Living Better With Heart Failure Booklet, HF zone tool, & Daily Weight Tracker Tool.  Patient has scale at home: Yes Patient has pill box at home: Yes    High Risk Criteria for Readmission and/or Poor Patient Outcomes: Heart failure hospital admissions (last 6 months): 1  No Show rate: 0% Difficult social situation: None Demonstrates  medication adherence: Yes Primary Language: English Literacy level: Reading, Writing, & Comprehension  Barriers of Care:   Daily Weights Diet & Fluid Restrictions  Considerations/Referrals:  Referral made to Heart Failure Pharmacist Stewardship: Yes Referral made to Heart Failure CSW/NCM TOC: No Referral made to Heart & Vascular TOC clinic: Yes. 07/14/2024 @ 10:30 AM  Items for Follow-up on DC/TOC: Daily Weights Diet & Fluid Restrictions Continued Heart Failure Education  Charmaine Pines, RN, BSN [facility] Heart Failure Navigator Secure Chat Only

## 2024-07-03 NOTE — Progress Notes (Signed)
  Progress Note  Patient Name: Sarah Sweeney Date of Encounter: 07/03/2024 [facility] Cardiologist: Lonni Hanson, MD   Interval Summary    UOP -2.4L. kidney function is stable. INR 2.1. She is overall feeling better. Breathing is improving. No chest pain reported. BP is better.   Vital Signs Vitals:   07/03/24 0455 07/03/24 0500 07/03/24 0632 07/03/24 0726  BP: (!) 147/84   (!) 144/59  Pulse: 62   63  Resp: 20   18  Temp: 98 F (36.7 C)   98.3 F (36.8 C)  TempSrc:    Oral  SpO2: 100%  100% 98%  Weight:  69.4 kg    Height:        Intake/Output Summary (Last 24 hours) at 07/03/2024 1004 Last data filed at 07/03/2024 1001 Gross per 24 hour  Intake 483 ml  Output 2600 ml  Net -2117 ml      07/03/2024    5:00 AM 07/02/2024    5:00 AM 07/01/2024    2:27 AM  Last 3 Weights  Weight (lbs) 153 lb 153 lb 3.5 oz 154 lb 1.6 oz  Weight (kg) 69.4 kg 69.5 kg 69.9 kg      Telemetry/ECG  V paced rhythm, PVCs - Personally Reviewed  Physical Exam  GEN: No acute distress.   Neck: No JVD Cardiac: RRR, no murmurs, rubs, or gallops.  Respiratory: Clear to auscultation bilaterally. GI: Soft, nontender, non-distended  MS: No edema  Assessment & Plan   Acute diastolic heart failure Apical hypertrophic cardiomyopathy Acute hypoxic respiratory failure - Patient presented with shortness of breath and lower leg edema, failed outpatient treatment with Lasix over the weekend.  Patient initially required BiPAP, now on nasal cannula at 2 L O2 - BNP elevated to the 4000's.  Chest CT consistent with CHF, no PE - Started on IV Lasix 40 mg twice daily - Net urine output -4.6L - Echocardiogram showed LVEF 55 to 60%, mildly elevated pulmonary artery systolic pressure, mild MR, mild to moderate TR. - Patient did not tolerate Coreg  due to fatigue. can trial low-dose metoprolol -Continue irbesartan  - started on Farxiga 10mg  daily and spiro 25mg  daily - Continue with diuresis.   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

## 2024-07-04 ENCOUNTER — Other Ambulatory Visit (HOSPITAL_COMMUNITY): Payer: Self-pay

## 2024-07-04 DIAGNOSIS — I422 Other hypertrophic cardiomyopathy: Secondary | ICD-10-CM

## 2024-07-04 DIAGNOSIS — I10 Essential (primary) hypertension: Secondary | ICD-10-CM | POA: Diagnosis not present

## 2024-07-04 DIAGNOSIS — J9601 Acute respiratory failure with hypoxia: Secondary | ICD-10-CM | POA: Diagnosis not present

## 2024-07-04 DIAGNOSIS — I214 Non-ST elevation (NSTEMI) myocardial infarction: Secondary | ICD-10-CM | POA: Diagnosis not present

## 2024-07-04 DIAGNOSIS — I480 Paroxysmal atrial fibrillation: Secondary | ICD-10-CM | POA: Diagnosis not present

## 2024-07-04 DIAGNOSIS — I4821 Permanent atrial fibrillation: Secondary | ICD-10-CM | POA: Diagnosis not present

## 2024-07-04 DIAGNOSIS — I5033 Acute on chronic diastolic (congestive) heart failure: Secondary | ICD-10-CM | POA: Diagnosis not present

## 2024-07-04 LAB — BASIC METABOLIC PANEL WITH GFR
Anion gap: 10 (ref 5–15)
BUN: 19 mg/dL (ref 8–23)
CO2: 32 mmol/L (ref 22–32)
Calcium: 8.3 mg/dL — ABNORMAL LOW (ref 8.9–10.3)
Chloride: 98 mmol/L (ref 98–111)
Creatinine, Ser: 1.02 mg/dL — ABNORMAL HIGH (ref 0.44–1.00)
GFR, Estimated: 55 mL/min — ABNORMAL LOW (ref >60)
Glucose, Bld: 87 mg/dL (ref 70–99)
Potassium: 4 mmol/L (ref 3.5–5.1)
Sodium: 140 mmol/L (ref 135–145)

## 2024-07-04 LAB — GLUCOSE, CAPILLARY
Glucose-Capillary: 98 mg/dL (ref 70–99)
Glucose-Capillary: 136 mg/dL — ABNORMAL HIGH (ref 70–99)
Glucose-Capillary: 121 mg/dL — ABNORMAL HIGH (ref 70–99)
Glucose-Capillary: 192 mg/dL — ABNORMAL HIGH (ref 70–99)

## 2024-07-04 LAB — CBC
HCT: 31.4 % — ABNORMAL LOW (ref 36.0–46.0)
Hemoglobin: 10 g/dL — ABNORMAL LOW (ref 12.0–15.0)
MCH: 28.5 pg (ref 26.0–34.0)
MCHC: 31.8 g/dL (ref 30.0–36.0)
MCV: 89.5 fL (ref 80.0–100.0)
Platelets: 220 K/uL (ref 150–400)
RBC: 3.51 MIL/uL — ABNORMAL LOW (ref 3.87–5.11)
RDW: 14.3 % (ref 11.5–15.5)
WBC: 5.8 K/uL (ref 4.0–10.5)
nRBC: 0 % (ref 0.0–0.2)

## 2024-07-04 LAB — PROTIME-INR
INR: 2.4 — ABNORMAL HIGH (ref 0.8–1.2)
Prothrombin Time: 26.9 s — ABNORMAL HIGH (ref 11.4–15.2)

## 2024-07-04 MED ORDER — FUROSEMIDE 10 MG/ML IJ SOLN
40.0000 mg | Freq: Once | INTRAMUSCULAR | Status: AC
  Administered 2024-07-04: 40 mg via INTRAVENOUS
  Filled 2024-07-04: qty 4

## 2024-07-04 MED ORDER — WARFARIN SODIUM 3 MG PO TABS
3.0000 mg | ORAL_TABLET | Freq: Once | ORAL | Status: AC
  Administered 2024-07-04: 3 mg via ORAL
  Filled 2024-07-04: qty 1

## 2024-07-04 MED ORDER — FUROSEMIDE 10 MG/ML IJ SOLN
80.0000 mg | Freq: Two times a day (BID) | INTRAMUSCULAR | Status: DC
  Administered 2024-07-04 – 2024-07-05 (×2): 80 mg via INTRAVENOUS
  Filled 2024-07-04 (×2): qty 8

## 2024-07-04 MED ORDER — ORAL CARE MOUTH RINSE: 15.0000 mL | OROMUCOSAL | Status: DC | PRN

## 2024-07-04 NOTE — Progress Notes (Signed)
 Walking Oxygen Levels  0 Mins - 1005 O2 93%  5 Mins - 1010 O2 87 % walking to door of room and back to bed, then standing at sink. SOB on exertion and fatigue  6 Mins - 1011 O2 88% Sitting on side of bed  7 Mins - 1012 O2 92 % Sitting on side of bed with 1L [place]  14 Mins - 1019 O2 97 % Sitting on side of bed, 1L [place]

## 2024-07-04 NOTE — Plan of Care (Signed)

## 2024-07-04 NOTE — Progress Notes (Signed)
 Cardiology Progress Note   Patient Name: HALENA MOHAR Date of Encounter: 07/04/2024  Primary Cardiologist: Lonni Hanson, MD  Subjective   Breathing improving but not quite back to baseline.  Lower extremity edema has completely resolved.  Has not ambulated much. Objective   Inpatient Medications    Scheduled Meds:  atorvastatin   20 mg Oral Daily   dapagliflozin  propanediol  10 mg Oral Daily   furosemide   80 mg Intravenous BID   insulin  aspart  0-9 Units Subcutaneous TID WC   irbesartan   300 mg Oral Daily   oxybutynin   15 mg Oral QHS   pantoprazole   40 mg Oral Daily   sodium chloride  flush  3 mL Intravenous Q12H   spironolactone   25 mg Oral Daily   warfarin  3 mg Oral ONCE-1600   Warfarin - Pharmacist Dosing Inpatient   Does not apply q1600   Continuous Infusions:  PRN Meds: acetaminophen , ondansetron  (ZOFRAN ) IV, sodium chloride  flush   Vital Signs    Vitals:   07/04/24 1011 07/04/24 1012 07/04/24 1019 07/04/24 1044  BP:    (!) 116/51  Pulse:      Resp:   14   Temp:      TempSrc:      SpO2: (!) 88% 92% 97%   Weight:      Height:        Intake/Output Summary (Last 24 hours) at 07/04/2024 1129 Last data filed at 07/04/2024 1001 Gross per 24 hour  Intake 603 ml  Output 1250 ml  Net -647 ml   Filed Weights   07/03/24 0500 07/04/24 0500 07/04/24 1010  Weight: 69.4 kg 69.2 kg 66.7 kg    Physical Exam   GEN: Well nourished, well developed, in no acute distress.  HEENT: Grossly normal.  Neck: Supple, JVD to jaw.  No carotid bruits or masses. Cardiac: RRR, no murmurs, rubs, or gallops. No clubbing, cyanosis, edema.  Radials 2+, DP/PT 2+ and equal bilaterally.  Respiratory:  Respirations regular and unlabored, clear to auscultation bilaterally. GI: Soft, nontender, nondistended, BS + x 4. MS: no deformity or atrophy. Skin: warm and dry, no rash. Neuro:  Strength and sensation are intact. Psych: AAOx3.  Normal affect.  Labs    Chemistry Recent  Labs  Lab 07/02/24 0432 07/03/24 0347 07/04/24 0423  NA 142 141 140  K 3.8 3.7 4.0  CL 102 98 98  CO2 32 33* 32  GLUCOSE 93 91 87  BUN 13 17 19   CREATININE 1.02* 1.02* 1.02*  CALCIUM  8.6* 8.6* 8.3*  GFRNONAA 55* 55* 55*  ANIONGAP 8 11 10      Hematology Recent Labs  Lab 07/01/24 0229 07/04/24 0423  WBC 7.4 5.8  RBC 3.77* 3.51*  HGB 10.8* 10.0*  HCT 34.6* 31.4*  MCV 91.8 89.5  MCH 28.6 28.5  MCHC 31.2 31.8  RDW 14.6 14.3  PLT 227 220   BNP    Component Value Date/Time   BNP 271.2 (H) 02/14/2022 1419    ProBNP    Component Value Date/Time   PROBNP 4,452.0 (H) 07/01/2024 0229   Lipids  Lab Results  Component Value Date   CHOL 141 05/24/2022   HDL 63 05/24/2022   LDLCALC 65 05/24/2022   TRIG 67 05/24/2022   CHOLHDL 2.2 05/24/2022    HbA1c  Lab Results  Component Value Date   HGBA1C 6.7 (H) 07/01/2024    Radiology    DG Chest 1 View Result Date: 07/02/2024 EXAM: 1 VIEW(S) XRAY OF  THE CHEST 07/02/2024 07:14:16 AM COMPARISON: 02/14/2022 CLINICAL HISTORY: CHF (congestive heart failure) (HCC) FINDINGS: LINES, TUBES AND DEVICES: Stable left-sided pacemaker. LUNGS AND PLEURA: No focal pulmonary opacity. No pleural effusion. No pneumothorax. HEART AND MEDIASTINUM: Stable cardiomediastinal silhouette. Stable left-sided pacemaker. BONES AND SOFT TISSUES: No acute osseous abnormality. IMPRESSION: 1. Stable cardiomediastinal silhouette. Electronically signed by: Lynwood Seip MD 07/02/2024 07:19 AM EST RP Workstation: [id number]   CT Angio Chest PE W and/or Wo Contrast Result Date: 07/01/2024 EXAM: CTA CHEST 07/01/2024 03:02:56 AM TECHNIQUE: CTA of the chest was performed without and with the administration of 75 mL of iohexol  (OMNIPAQUE ) 350 MG/ML injection. Multiplanar reformatted images are provided for review. MIP images are provided for review. Automated exposure control, iterative reconstruction, and/or weight based adjustment of the mA/kV was utilized to reduce  the radiation dose to as low as reasonably achievable. COMPARISON: None available. CLINICAL HISTORY: History of PE, high risk, here with shortness of breath and sats of 84% on room air. FINDINGS: PULMONARY ARTERIES: Pulmonary arteries are adequately opacified for evaluation. Small web noted within the right lower lobar pulmonary artery keeping with remote pulmonary embolism. No acute pulmonary embolus identified. Main pulmonary artery is normal in caliber. MEDIASTINUM: Equivocal cardiac size within normal limits. Left subclavian single lead pacemaker in place with its lead within the intraventricular septum. No pericardial effusion. Mild multivessel coronary artery calcification. Mild atherosclerotic calcification within the thoracic aorta. No aortic aneurysm. The thyroid  is unremarkable. LYMPH NODES: Shotty mediastinal adenopathy nonspecific which limits measuring up to 12 mm in short axis diameter. No hilar or axillary lymphadenopathy. LUNGS AND PLEURA: There is extensive patchy bilateral ground-glass pulmonary infiltrate with associated smooth interlobular septal thickening in keeping with moderate pulmonary edema. Small bilateral pleural effusions are present. The findings, together, may reflect changes of moderate pulmonary edema. 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 Similar AP narrowing of the bronchus intermedius suggests some bronchial in this location. No pneumothorax. UPPER ABDOMEN: Limited images of the upper abdomen are unremarkable. SOFT TISSUES AND BONES: Osseous structures are age appropriate. No acute soft tissue abnormality. IMPRESSION: 1. No acute pulmonary embolism. Small web in the right lower lobar pulmonary artery consistent with remote pulmonary embolism 2. Extensive patchy bilateral ground-glass opacities with smooth interlobular septal thickening, consistent with moderate  pulmonary edema, with small bilateral pleural effusions. Altogether, the findings are in keeping with changes of moderate cardiogenic failure. 3. 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 Left ventricular ejection fraction, by estimation, is 55 to 60%. The  left ventricle has normal function. The left ventricle has no regional  wall motion abnormalities. Left ventricular diastolic parameters are  indeterminate.   2. Right ventricular systolic function is normal. The right ventricular  size is normal. There is mildly elevated pulmonary artery systolic  pressure. The estimated right ventricular systolic pressure is 43.4 mmHg.   3. Left atrial size was mildly dilated.   4. The mitral valve is normal in structure. Mild mitral valve  regurgitation. No evidence of mitral stenosis.   5. Tricuspid valve regurgitation is mild to moderate.   6. The aortic  valve is normal in structure. Aortic valve regurgitation is  not visualized. No aortic stenosis is present.   7. The inferior vena cava is dilated in size with >50% respiratory  variability, suggesting right atrial pressure of 8 mmHg.  _____________   Patient Profile     83 y.o. female with a history of apical hypertrophic cardiomyopathy, chronic HFpEF, permanent A-fib/flutter on warfarin, symptomatic bradycardia status post permanent pacemaker, unprovoked PE in 2023, hypertension, sleep apnea, obesity, GERD, and type 2 diabetes mellitus, who was admitted with acute on chronic HFpEF.  EF 55-60% with RVSP 43.4 mmHg by echo this admission.  Assessment & Plan    1.  Acute on chronic HFpEF/apical hypertrophic cardiomyopathy: Patient  admitted November 18 with progressive dyspnea, lower extremity edema, and hypoxia requiring CPAP.  She has been responding well to diuretics.  Echo this admission with an EF of 55 to 60%, normal RV function, and RVSP 43.4 mmHg.  Mild to moderate TR and mild MR.  She is -1047 mL overnight and 5.3 L since admission.  Still with JVD to jaw the lower extremity edema has improved.  Creatinine stable at 1.02.  Advancing Lasix  to 80 mg IV twice daily.  She is approximately 1 kg with prior dry weight.  Pending response to higher dose of Lasix  today, could potentially look to change to p.o. tomorrow.  Heart rate and blood pressure stable.  Cont ARB, farxiga , and spiro.  2.  Demand Ischemia:  In the setting of above, hsTrop mildly elevated, 26  27.  No chest pain.  Echo with normal LV function and no regional wall motion abnormalities this admission.  Nonischemic Myoview  2020.  No plan for repeat inpatient ischemic evaluation however will need to reconsider in outpatient setting.  Continue statin therapy.  No aspirin in the setting of chronic warfarin.  3.  Permanent A-fib/flutter: Also with history of bradycardia status post dual-chamber permanent pacemaker.  She is a sensed and V paced on telemetry with underlying A-fib.  Rates in the 60s.  Continue warfarin per pharmacy team.  4.  Primary hypertension: Blood pressure stable on ARB, spironolactone , and IV Lasix .  5.  History of PE: CTA negative for PE.  On warfarin.  Signed, Lonni Meager, NP  07/04/2024, 11:29 AM    For questions or updates, please contact   Please consult [email or web address] for contact info under Cardiology/STEMI.

## 2024-07-04 NOTE — Consult Note (Signed)
 PHARMACY - ANTICOAGULATION CONSULT NOTE  Pharmacy Consult for Warfarin Indication: atrial fibrillation  No Known Allergies  Patient Measurements: Height: 5' 4 (162.6 cm) Weight: 69.2 kg (152 lb 9.6 oz) IBW/kg (Calculated) : 54.7 HEPARIN  DW (KG): 68.8  Vital Signs: Temp: 98 F (36.7 C) (11/21 0433) Temp Source: Oral (11/21 0433) BP: 133/86 (11/21 0433) Pulse Rate: 66 (11/21 0433)  Labs: Recent Labs    07/02/24 0432 07/03/24 0347 07/04/24 0423  HGB  --   --  10.0*  HCT  --   --  31.4*  PLT  --   --  220  LABPROT 24.6* 24.9* 26.9*  INR 2.1* 2.1* 2.4*  CREATININE 1.02* 1.02*  --     Estimated Creatinine Clearance: 40.6 mL/min (A) (by C-G formula based on SCr of 1.02 mg/dL (H)).   Medical History: Past Medical History:  Diagnosis Date   Allergy    Apical variant hypertrophic cardiomyopathy (HCC)    Atrial fibrillation and flutter (HCC)    Diabetes mellitus without complication (HCC)    Erythrocytosis 03/02/2015   GERD (gastroesophageal reflux disease)    Headache    Hypertension    Pulmonary embolism (HCC) 2013   Coumadin  Therapy   Sleep apnea    CPAP   Symptomatic bradycardia    s/p single-chamber pacemaker (08/2021 - Dr. Cindie)    Medications:  Scheduled:   atorvastatin   20 mg Oral Daily   dapagliflozin  propanediol  10 mg Oral Daily   furosemide   40 mg Intravenous BID   insulin  aspart  0-9 Units Subcutaneous TID WC   irbesartan   300 mg Oral Daily   oxybutynin   15 mg Oral QHS   pantoprazole   40 mg Oral Daily   sodium chloride  flush  3 mL Intravenous Q12H   spironolactone   25 mg Oral Daily   Warfarin - Pharmacist Dosing Inpatient   Does not apply q1600   Infusions:  PRN: acetaminophen , ondansetron  (ZOFRAN ) IV, sodium chloride  flush  Assessment: 83 year-old female with Afib/aflutter, hx of PE (2017), and HF. She presented with SOB and ankle swelling and is currently being diuresed with Lasix  40 IV BID. Hgb dropped from 10.8 on 11/18 to 10.0 today,  but no signs or symptoms of bleeding noted. Platelets remain stable. Volume overload can cause INR to be elevated, and as the patient is diuresed INR may trend down. Baseline INR was subtherapeutic. No major DDI on med list. CHADSVASC score of 8 (>75, female, HTN, PE hx, DM, CHF).   Home regimen: warfarin 2 mg on Mondays and 3 mg on all other days. (I personally confirmed with patient on 07/03/24)    Goal of Therapy:  INR 2-3 Monitor platelets by anticoagulation protocol: Yes  Date INR Warfarin Dose   11/18 1.7 4 mg   11/19 2.1 2 mg  11/20 2.1 3 mg  11/21 2.4 3 mg    Plan:  INR is therapeutic today. Will give warfarin 3 mg x 1 (home dose) Check INR daily  CBC at least every 3 days   Signe Platt, PharmD Candidate 07/04/2024,7:26 AM

## 2024-07-04 NOTE — Progress Notes (Addendum)
 Heart Failure Stewardship Pharmacy Note  PCP: Epifanio Alm SQUIBB, MD PCP-Cardiologist: Lonni Hanson, MD  HPI: Sarah Sweeney is a 83 y.o. female with hypertrophic cardiomyopathy, chronic HFpEF, chronic A-flutter on Coumadin , symptomatic bradycardia status post PPM, pulmonary embolism who presented with progressive shortness of breath, orthopnea, and ankle swelling. On admission, proBNP was 4452, HS-troponin was 29, and A1c 6.7. Chest x-ray noted to be stable. CTA noted no acute PE, moderate pulmonary edema, bilateral pleural effusions.  Pertinent cardiac history: Diagnosed with AF 02/2019. TTE 03/2019 noted LVEF 50-55%, severe asymmetric LVH of the apical wall consistent with apical hypertrophic CM, low-normal RV function, and mild to moderate MR. CMRI 04/2019 noted apical HCM, LGE of inferior RV insertion site. Underwent DCCV 07/2019. Presented in 05/2021 in [place] with slow ventricular response and eventually underwent PPM implant in 08/2021. TTE 02/2022 with LVEF of 55-60%.  Pertinent Lab Values: Creatinine  Date Value Ref Range Status  11/10/2014 0.94 mg/dL Final    Comment:    9.55-8.99 NOTE: New Reference Range  10/20/14    Creatinine, Ser  Date Value Ref Range Status  07/03/2024 1.02 (H) 0.44 - 1.00 mg/dL Final   BUN  Date Value Ref Range Status  07/03/2024 17 8 - 23 mg/dL Final  89/77/7979 18 8 - 27 mg/dL Final  90/76/7986 18 7 - 18 mg/dL Final   Potassium  Date Value Ref Range Status  07/03/2024 3.7 3.5 - 5.1 mmol/L Final  05/06/2012 4.3 3.5 - 5.1 mmol/L Final   Sodium  Date Value Ref Range Status  07/03/2024 141 135 - 145 mmol/L Final  06/05/2019 142 134 - 144 mmol/L Final  05/06/2012 144 136 - 145 mmol/L Final   B Natriuretic Peptide  Date Value Ref Range Status  02/14/2022 271.2 (H) 0.0 - 100.0 pg/mL Final    Comment:    Performed at [facility], [address] [place], [place] [address]   Magnesium  Date Value Ref Range Status   03/04/2019 2.2 1.7 - 2.4 mg/dL Final    Comment:    Performed at [facility], [address] [place], [place] [address]   Hemoglobin A1C  Date Value Ref Range Status  05/05/2012 6.8 (H) 4.2 - 6.3 % Final    Comment:    The American Diabetes Association recommends that a primary goal of therapy should be <7% and that physicians should reevaluate the treatment regimen in patients with HbA1c values consistently >8%.    Hgb A1c MFr Bld  Date Value Ref Range Status  07/01/2024 6.7 (H) 4.8 - 5.6 % Final    Comment:    (NOTE) Diagnosis of Diabetes The following HbA1c ranges recommended by the American Diabetes Association (ADA) may be used as an aid in the diagnosis of diabetes mellitus.  Hemoglobin             Suggested A1C NGSP%              Diagnosis  <5.7                   Non Diabetic  5.7-6.4                Pre-Diabetic  >6.4                   Diabetic  <7.0                   Glycemic control for  adults with diabetes.     TSH  Date Value Ref Range Status  02/14/2022 2.936 0.350 - 4.500 uIU/mL Final    Comment:    Performed by a 3rd Generation assay with a functional sensitivity of <=0.01 uIU/mL. Performed at [facility], [address] [place], [place] [address]   05/18/2021 3.380 0.450 - 4.500 uIU/mL Final    Vital Signs:  Temp:  [98 F (36.7 C)-98.4 F (36.9 C)] 98 F (36.7 C) (11/21 0433) Pulse Rate:  [60-68] 66 (11/21 0433) Cardiac Rhythm: Ventricular paced (11/21 0700) Resp:  [19-20] 20 (11/21 0008) BP: (115-143)/(54-86) 133/86 (11/21 0433) SpO2:  [97 %-100 %] 97 % (11/21 0433) Weight:  [69.2 kg (152 lb 9.6 oz)] 69.2 kg (152 lb 9.6 oz) (11/21 0500)  Intake/Output Summary (Last 24 hours) at 07/04/2024 0824 Last data filed at 07/04/2024 9562 Gross per 24 hour  Intake 603 ml  Output 1650 ml  Net -1047 ml    Current Heart Failure Medications:  Loop diuretic: furosemide  40 mg IV BID Beta-Blocker:  none ACEI/ARB/ARNI: irbesartan  300 mg daily MRA: spironolactone  25 mg daily SGLT2i: Farxiga  10 mg daily Other:  Prior to admission Heart Failure Medications:  Loop diuretic: furosemide  20 mg daily Beta-Blocker: none ACEI/ARB/ARNI: olmesartan  40 mg daily MRA: none SGLT2i: none Other: amlodipine  2.5 mg daily  Assessment: 1. Acute on chronic diastolic heart failure (LVEF 55-60%)  , due to NICM. NYHA class II-III symptoms.  -Symptoms: Patient reports feeling much improved. Still requiring O2 overnight. LEE is much improved. Appetite is great. -Volume: Nearing euvolemia. Creatinine stable. BUN slightly trending up. Urine output is modest and weight is minimally decreased. -Hemodynamics: BP is elevated, HR 60s. -SGLT2i: Continue Farxiga  10 mg daily as first line therapy for HFpEF. Copay is high due to deductible. Patient may be eligible for grant. -MRA: Continue spironolactone  25 mg daily.  -ARNI: Patient is of the cohort that may benefit from Entresto for the treatment of HFpEF. Entresto may be cost prohibitive due to deductible.  Plan: 1) Medication changes recommended at this time: -None at this time.  2) Patient assistance: -Farxiga  copay is $287.11. Jardiance  copay is $287.11 Copays are so high due to a deductible.  3) Education: - Patient has been educated on current HF medications and potential additions to HF medication regimen - Patient verbalizes understanding that over the next few months, these medication doses may change and more medications may be added to optimize HF regimen - Patient has been educated on basic disease state pathophysiology and goals of therapy  Medication Assistance / Insurance Benefits Check: Does the patient have prescription insurance?    Please do not hesitate to reach out with questions or concerns,  Jaun Bash, PharmD, CPP, BCPS, [facility] Heart Failure Pharmacist  Phone - [phone] 07/04/2024 9:35 AM

## 2024-07-04 NOTE — Progress Notes (Signed)
 Progress Note   Patient: Sarah Sweeney FMW:[id number] DOB: 04-21-1941 DOA: 07/01/2024     3 DOS: the patient was seen and examined on 07/04/2024   Brief hospital course: 83 y.o. 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   Symptoms started about 1 week ago, patient started noticed increasing bilateral ankle swelling, and she gained about 7 pounds over 7 days.  Since Saturday, she started to feel increasing exertional dyspnea, she took as needed Lasix  for weight gain and she took Lasix  last Saturday and Sunday with some improvement however last night her symptoms became worse and could not lie flat because of shortness of breath and came to ED.  She denies any cough no fever or chills.  EMS arrived at her home and found her O2 saturation 84% on room air, and patient was found to speak in broken sentences and was placed on CPAP. ED Course: Afebrile, borderline bradycardia heart rate upper 50s, O2 saturation 98% on BiPAP.  Chest x-ray showed bilateral pulmonary congestion.  CTA showed right lower lobe chronic PE but no acute PE, and bilateral ground glass like changes compatible with pulmonary edema.  Blood work showed BUN 13 creatinine 0.8 bicarb 26K 4.1.  VBG 7.4/48/29.  11/19.  Patient still short of breath this morning.  Patient does not wear oxygen at home.  Patient admitted with CHF.  Continue IV Lasix . 11/20.  Continue IV Lasix . 11/21.  Cardiology increase Lasix  to 80 mg IV twice daily.  Still dropped saturations with ambulation today to 87%.  Assessment and Plan: * Acute on chronic diastolic CHF (congestive heart failure) (HCC) Increase diuresis of Lasix  80 mg IV twice daily.  Continue Farxiga , Avapro , spironolactone .  Acute respiratory failure with hypoxia (HCC) Patient initially had  respiratory distress requiring initial BiPAP.  As per nursing staff she become hypoxic with walking on room air down to 87% today.  Continue trying to get off oxygen on a daily basis.  Paroxysmal atrial fibrillation (HCC) EKG shows paced rhythm.  On Coumadin .  Essential hypertension Holding amlodipine  with peripheral edema.  On IV Lasix .  Started on Avapro , Aldactone  and Farxiga .  History of pulmonary embolism On Coumadin   Myocardial injury Demand ischemia secondary to heart failure.  CKD stage 3a, GFR 45-59 ml/min (HCC) Monitor with diuresis.  Last creatinine 1.02 with a GFR 55.  Overweight (BMI 25.0-29.9) BMI 25.23.        Subjective: Patient still feels a little short of breath.  Still dropping saturations with ambulation.  Admitted with CHF exacerbation.  Physical Exam: Vitals:   07/04/24 1012 07/04/24 1019 07/04/24 1044 07/04/24 1202  BP:   (!) 116/51 (!) 122/59  Pulse:    63  Resp:  14  17  Temp:    97.6 F (36.4 C)  TempSrc:      SpO2: 92% 97%  97%  Weight:      Height:       Physical Exam HENT:     Head: Normocephalic.     Mouth/Throat:     Pharynx: No oropharyngeal exudate.  Eyes:     General: Lids are normal.     Conjunctiva/sclera: Conjunctivae normal.  Cardiovascular:     Rate and Rhythm: Normal rate and regular rhythm.     Heart sounds: Normal heart sounds, S1 normal and S2 normal.  Pulmonary:     Breath sounds:  Examination of the right-lower field reveals decreased breath sounds and rales. Examination of the left-lower field reveals decreased breath sounds and rales. Decreased breath sounds and rales present. No wheezing or rhonchi.  Abdominal:     Palpations: Abdomen is soft.     Tenderness: There is no abdominal tenderness.  Musculoskeletal:     Right lower leg: No swelling.     Left lower leg: No swelling.  Skin:    General: Skin is warm.     Findings: No rash.  Neurological:     Mental Status: She is alert and oriented to person, place,  and time.     Data Reviewed: Creatinine 1.02 with a GFR 55, white blood cell count 5.8, hemoglobin 10.0, platelet count 220, INR 2.4  Family Communication: Spoke with sister on the phone  Disposition: Status is: Inpatient Remains inpatient appropriate because: Increase diuresis to Lasix  80 mg IV twice daily  Planned Discharge Destination: Home    Time spent: 28 minutes  Author: Charlie Patterson, MD 07/04/2024 1:41 PM  For on call review [email or web address].

## 2024-07-05 ENCOUNTER — Inpatient Hospital Stay

## 2024-07-05 DIAGNOSIS — Z86711 Personal history of pulmonary embolism: Secondary | ICD-10-CM | POA: Diagnosis not present

## 2024-07-05 DIAGNOSIS — I5033 Acute on chronic diastolic (congestive) heart failure: Secondary | ICD-10-CM | POA: Diagnosis not present

## 2024-07-05 DIAGNOSIS — I10 Essential (primary) hypertension: Secondary | ICD-10-CM | POA: Diagnosis not present

## 2024-07-05 DIAGNOSIS — Z950 Presence of cardiac pacemaker: Secondary | ICD-10-CM

## 2024-07-05 DIAGNOSIS — R509 Fever, unspecified: Secondary | ICD-10-CM | POA: Diagnosis not present

## 2024-07-05 DIAGNOSIS — J9601 Acute respiratory failure with hypoxia: Secondary | ICD-10-CM | POA: Diagnosis not present

## 2024-07-05 DIAGNOSIS — N179 Acute kidney failure, unspecified: Secondary | ICD-10-CM

## 2024-07-05 DIAGNOSIS — N189 Chronic kidney disease, unspecified: Secondary | ICD-10-CM

## 2024-07-05 LAB — RESP PANEL BY RT-PCR (RSV, FLU A&B, COVID)  RVPGX2
Influenza A by PCR: NEGATIVE
Influenza B by PCR: NEGATIVE
Resp Syncytial Virus by PCR: NEGATIVE
SARS Coronavirus 2 by RT PCR: NEGATIVE

## 2024-07-05 LAB — GLUCOSE, CAPILLARY
Glucose-Capillary: 138 mg/dL — ABNORMAL HIGH (ref 70–99)
Glucose-Capillary: 115 mg/dL — ABNORMAL HIGH (ref 70–99)
Glucose-Capillary: 161 mg/dL — ABNORMAL HIGH (ref 70–99)

## 2024-07-05 LAB — URINALYSIS, W/ REFLEX TO CULTURE (INFECTION SUSPECTED)
Bilirubin Urine: NEGATIVE
Glucose, UA: >=500 mg/dL — AB
Ketones, ur: NEGATIVE mg/dL
Nitrite: NEGATIVE
Protein, ur: 100 mg/dL — AB
Specific Gravity, Urine: 1.009 (ref 1.005–1.030)
WBC, UA: >50 WBC/hpf (ref 0–5)
pH: 6 (ref 5.0–8.0)

## 2024-07-05 LAB — BASIC METABOLIC PANEL WITH GFR
Anion gap: 11 (ref 5–15)
BUN: 18 mg/dL (ref 8–23)
CO2: 35 mmol/L — ABNORMAL HIGH (ref 22–32)
Calcium: 8.6 mg/dL — ABNORMAL LOW (ref 8.9–10.3)
Chloride: 94 mmol/L — ABNORMAL LOW (ref 98–111)
Creatinine, Ser: 1.14 mg/dL — ABNORMAL HIGH (ref 0.44–1.00)
GFR, Estimated: 48 mL/min — ABNORMAL LOW (ref >60)
Glucose, Bld: 87 mg/dL (ref 70–99)
Potassium: 3.2 mmol/L — ABNORMAL LOW (ref 3.5–5.1)
Sodium: 140 mmol/L (ref 135–145)

## 2024-07-05 LAB — PROTIME-INR
INR: 3 — ABNORMAL HIGH (ref 0.8–1.2)
Prothrombin Time: 32.8 s — ABNORMAL HIGH (ref 11.4–15.2)

## 2024-07-05 MED ORDER — FUROSEMIDE 40 MG PO TABS
40.0000 mg | ORAL_TABLET | Freq: Every day | ORAL | Status: DC
  Administered 2024-07-06: 40 mg via ORAL
  Filled 2024-07-05: qty 1

## 2024-07-05 MED ORDER — SODIUM CHLORIDE 0.9 % IV SOLN
1.0000 g | INTRAVENOUS | Status: DC
  Administered 2024-07-05 – 2024-07-06 (×2): 1 g via INTRAVENOUS
  Filled 2024-07-05 (×2): qty 10

## 2024-07-05 MED ORDER — AZITHROMYCIN 250 MG PO TABS
250.0000 mg | ORAL_TABLET | Freq: Every day | ORAL | Status: DC
  Administered 2024-07-06: 250 mg via ORAL
  Filled 2024-07-05: qty 1

## 2024-07-05 MED ORDER — POTASSIUM CHLORIDE CRYS ER 20 MEQ PO TBCR
40.0000 meq | EXTENDED_RELEASE_TABLET | Freq: Once | ORAL | Status: AC
  Administered 2024-07-05: 40 meq via ORAL
  Filled 2024-07-05: qty 2

## 2024-07-05 MED ORDER — AZITHROMYCIN 250 MG PO TABS
500.0000 mg | ORAL_TABLET | Freq: Every day | ORAL | Status: AC
Start: 2024-07-05 — End: 2024-07-05
  Administered 2024-07-05: 500 mg via ORAL
  Filled 2024-07-05: qty 2

## 2024-07-05 NOTE — Assessment & Plan Note (Signed)
 Creatinine went up to 1.20.  Creatinine upon admission 0.88.  Changed over to oral diuretics.  Recommend checking a BMP with follow-up appointment.

## 2024-07-05 NOTE — Progress Notes (Addendum)
 Cardiology Progress Note   Patient Name: Sarah Sweeney Date of Encounter: 07/05/2024  Primary Cardiologist: Lonni Hanson, MD  Subjective   Feels like breathing is a little more labored this AM.  Has back pain which she attributes to getting up and down frequently yesterday as she had 5 normal bowel movements.  No chest pain. Objective   Inpatient Medications    Scheduled Meds:  atorvastatin   20 mg Oral Daily   [START ON 07/06/2024] azithromycin   250 mg Oral Daily   dapagliflozin  propanediol  10 mg Oral Daily   [START ON 07/06/2024] furosemide   40 mg Oral Daily   insulin  aspart  0-9 Units Subcutaneous TID WC   irbesartan   300 mg Oral Daily   oxybutynin   15 mg Oral QHS   pantoprazole   40 mg Oral Daily   sodium chloride  flush  3 mL Intravenous Q12H   spironolactone   25 mg Oral Daily   Warfarin - Pharmacist Dosing Inpatient   Does not apply q1600   Continuous Infusions:  cefTRIAXone  (ROCEPHIN )  IV     PRN Meds: acetaminophen , ondansetron  (ZOFRAN ) IV, mouth rinse, sodium chloride  flush   Vital Signs    Vitals:   07/05/24 0036 07/05/24 0407 07/05/24 0500 07/05/24 0827  BP: (!) 101/44 (!) 136/54  (!) 116/45  Pulse: (!) 59 62  63  Resp: 20 20    Temp: 98.2 F (36.8 C) 98.1 F (36.7 C)  (!) 101.1 F (38.4 C)  TempSrc:      SpO2: 97% 99%  97%  Weight:   66.5 kg   Height:        Intake/Output Summary (Last 24 hours) at 07/05/2024 1202 Last data filed at 07/05/2024 0900 Gross per 24 hour  Intake 723 ml  Output 1200 ml  Net -477 ml   Filed Weights   07/04/24 0500 07/04/24 1010 07/05/24 0500  Weight: 69.2 kg 66.7 kg 66.5 kg    Physical Exam   GEN: Well nourished, well developed, in no acute distress.  HEENT: Grossly normal.  Neck: Supple, no JVD, carotid bruits, or masses. Cardiac: RRR, no murmurs, rubs, or gallops. No clubbing, cyanosis, edema.  Radials 2+, DP/PT 2+ and equal bilaterally.  Respiratory:  Respirations regular.  Speech somewhat labored.   Diminished breath sounds throughout. GI: Soft, nontender, nondistended, BS + x 4. MS: no deformity or atrophy. Skin: warm and dry, no rash. Neuro:  Strength and sensation are intact. Psych: AAOx3.  Normal affect.  Labs    Chemistry Recent Labs  Lab 07/03/24 0347 07/04/24 0423 07/05/24 0434  NA 141 140 140  K 3.7 4.0 3.2*  CL 98 98 94*  CO2 33* 32 35*  GLUCOSE 91 87 87  BUN 17 19 18   CREATININE 1.02* 1.02* 1.14*  CALCIUM  8.6* 8.3* 8.6*  GFRNONAA 55* 55* 48*  ANIONGAP 11 10 11      Hematology Recent Labs  Lab 07/01/24 0229 07/04/24 0423  WBC 7.4 5.8  RBC 3.77* 3.51*  HGB 10.8* 10.0*  HCT 34.6* 31.4*  MCV 91.8 89.5  MCH 28.6 28.5  MCHC 31.2 31.8  RDW 14.6 14.3  PLT 227 220    Cardiac Enzymes   HsTrop T:  26  27     BNP    Component Value Date/Time   BNP 271.2 (H) 02/14/2022 1419    ProBNP    Component Value Date/Time   PROBNP 4,452.0 (H) 07/01/2024 0229   Lipids  Lab Results  Component Value Date  CHOL 141 05/24/2022   HDL 63 05/24/2022   LDLCALC 65 05/24/2022   TRIG 67 05/24/2022   CHOLHDL 2.2 05/24/2022    HbA1c  Lab Results  Component Value Date   HGBA1C 6.7 (H) 07/01/2024   Lab Results  Component Value Date   INR 3.0 (H) 07/05/2024   INR 2.4 (H) 07/04/2024   INR 2.1 (H) 07/03/2024    Radiology    DG Chest Port 1 View Result Date: 07/05/2024 EXAM: 1 VIEW(S) XRAY OF THE CHEST 07/05/2024 10:13:38 AM COMPARISON: 07/02/2024 CLINICAL HISTORY: Dyspnea FINDINGS: LUNGS AND PLEURA: Mild interstitial prominence. No pleural effusion. No pneumothorax. HEART AND MEDIASTINUM: Cardiomegaly. Left chest cardiac pacing device in place. Atherosclerotic calcifications of the aorta. BONES AND SOFT TISSUES: No acute osseous abnormality. IMPRESSION: 1. Mild interstitial prominence without frank interstitial edema or pleural effusion. . Electronically signed by: Waddell Calk MD 07/05/2024 10:29 AM EST RP Workstation: [id number]   DG Chest 1 View Result  Date: 07/02/2024 EXAM: 1 VIEW(S) XRAY OF THE CHEST 07/02/2024 07:14:16 AM COMPARISON: 02/14/2022 CLINICAL HISTORY: CHF (congestive heart failure) (HCC) FINDINGS: LINES, TUBES AND DEVICES: Stable left-sided pacemaker. LUNGS AND PLEURA: No focal pulmonary opacity. No pleural effusion. No pneumothorax. HEART AND MEDIASTINUM: Stable cardiomediastinal silhouette. Stable left-sided pacemaker. BONES AND SOFT TISSUES: No acute osseous abnormality. IMPRESSION: 1. Stable cardiomediastinal silhouette. Electronically signed by: Lynwood Seip MD 07/02/2024 07:19 AM EST RP Workstation: [id number]      Telemetry    A sensed, V paced, 61, underlying Afib - Personally Reviewed  Cardiac Studies   2D Echocardiogram 11.2025   1. Left ventricular ejection fraction, by estimation, is 55 to 60%. The  left ventricle has normal function. The left ventricle has no regional  wall motion abnormalities. Left ventricular diastolic parameters are  indeterminate.   2. Right ventricular systolic function is normal. The right ventricular  size is normal. There is mildly elevated pulmonary artery systolic  pressure. The estimated right ventricular systolic pressure is 43.4 mmHg.   3. Left atrial size was mildly dilated.   4. The mitral valve is normal in structure. Mild mitral valve  regurgitation. No evidence of mitral stenosis.   5. Tricuspid valve regurgitation is mild to moderate.   6. The aortic valve is normal in structure. Aortic valve regurgitation is  not visualized. No aortic stenosis is present.   7. The inferior vena cava is dilated in size with >50% respiratory  variability, suggesting right atrial pressure of 8 mmHg.  _____________   Patient Profile     83 y.o. 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 EF 55-60% with RVSP 43.4 mmHg by echo this admission.   Assessment & Plan    1.  Acute on chronic HFpEF/apical hypertrophic cardiomyopathy: Admitted November 18 with dyspnea, edema, and hypoxia requiring CPAP.  Echo this admission with an EF of 55 to 60% with normal RV function, RVSP 43.4, mild to moderate TR, and mild MR.  We escalated diuretic therapy yesterday and she has responded well.  -1 L yesterday and -6.1 L since admission.  Weight is down 3.4 kg since admission and is now lower than previous dry weight.  In that setting, creatinine has risen slightly to 1.14.  She received IV Lasix  this morning.  Will discontinue further doses and change her  to oral Lasix  at 40 mg daily (previous home dose 20 mg daily).  Though she is more short of breath this morning, I suspect that this is unrelated to her heart failure.  Chest x-ray this morning shows mild interstitial prominence without frank edema and ceftriaxone  and azithromycin  has been started by primary team.  2.  Acute respiratory failure: Initially managed as HFpEF, though now with diminished breath sounds, some worsening of dyspnea, and euvolemia.  Chest x-ray with interstitial prominence.  Ceftriaxone  and azithromycin  started by primary team.  3.  Demand ischemia: In the setting of above, troponin mildly elevated to 27.  She has not had any chest pain.  Echo shows normal LV function without regional motion abnormalities.  She had nonischemic Myoview  in 2020.  No plan for repeat ischemic evaluation while she is admitted, but will reconsider in the outpatient setting.  Continue statin.  No aspirin in the setting of chronic warfarin.  4.  Permanent atrial fibrillation/flutter: Also with a history bradycardia status post dual-chamber permanent pacemaker.  She is a sensed and V paced on telemetry with underlying A-fib.  Rates in the 60s.  She remains on warfarin therapy with  supratherapeutic INR of 3.0.  Will need to watch INR closely in the setting of antibiotic therapy.  5.  Primary hypertension: Stable on ARB and spironolactone .  6.  History of PE: CTA negative for PE.  She remains on warfarin.  7.  Normocytic anemia: Stable.  Signed, Lonni Meager, NP  07/05/2024, 12:02 PM    For questions or updates, please contact   Please consult [email or web address] for contact info under Cardiology/STEMI.

## 2024-07-05 NOTE — Progress Notes (Signed)
 SATURATION QUALIFICATIONS: (This note is used to comply with regulatory documentation for home oxygen)  Patient Saturations on Room Air at Rest = 92%  Patient Saturations on Room Air while Ambulating = 88%  Patient Saturations on 2 Liters of oxygen while Ambulating = 96%  Ambulated 1 lap around RN station

## 2024-07-05 NOTE — Assessment & Plan Note (Signed)
 Acute cystitis with hematuria.  Fever 101 degrees on 11/22.  Blood cultures so far no growth.   COVID test negative.  RSV negative.  Flu negative.  Patient empirically given Rocephin  and Zithromax .  Patient felt much better after antibiotic yesterday.  No further fever today.  Will discharge home after Rocephin  dose today and on Keflex  for few more days.

## 2024-07-05 NOTE — Plan of Care (Signed)

## 2024-07-05 NOTE — Consult Note (Signed)
 PHARMACY - ANTICOAGULATION CONSULT NOTE  Pharmacy Consult for Warfarin Indication: atrial fibrillation  Patient Measurements: Height: 5' 4 (162.6 cm) Weight: 66.5 kg (146 lb 9.7 oz) IBW/kg (Calculated) : 54.7 HEPARIN  DW (KG): 68.8  Labs: Recent Labs    07/03/24 0347 07/04/24 0423 07/05/24 0434  HGB  --  10.0*  --   HCT  --  31.4*  --   PLT  --  220  --   LABPROT 24.9* 26.9* 32.8*  INR 2.1* 2.4* 3.0*  CREATININE 1.02* 1.02* 1.14*    Estimated Creatinine Clearance: 35.7 mL/min (A) (by C-G formula based on SCr of 1.14 mg/dL (H)).   Medical History: Past Medical History:  Diagnosis Date   Allergy    Apical variant hypertrophic cardiomyopathy (HCC)    Atrial fibrillation and flutter (HCC)    Diabetes mellitus without complication (HCC)    Erythrocytosis 03/02/2015   GERD (gastroesophageal reflux disease)    Headache    Hypertension    Pulmonary embolism (HCC) 2013   Coumadin  Therapy   Sleep apnea    CPAP   Symptomatic bradycardia    s/p single-chamber pacemaker (08/2021 - Dr. Cindie)    Assessment: 83 year-old female with Afib/aflutter, hx of PE (2017), and HF. She presented with SOB and ankle swelling and is currently being diuresed with Lasix  40 IV BID. Hgb dropped from 10.8 on 11/18 to 10.0 today, but no signs or symptoms of bleeding noted. Platelets remain stable. Volume overload can cause INR to be elevated, and as the patient is diuresed INR may trend down. Baseline INR was subtherapeutic. No major DDI on med list. CHADSVASC score of 8 (>75, female, HTN, PE hx, DM, CHF).   Home regimen: warfarin 2 mg on Mondays and 3 mg on all other days  Goal of Therapy:  INR 2-3 Monitor platelets by anticoagulation protocol: Yes  Date INR Warfarin Dose   11/18 1.7 4 mg   11/19 2.1 2 mg  11/20 2.1 3 mg  11/21 2.4 3 mg  11/22 3.0 HOLD   Plan:  --INR is therapeutic at upper limit of therapeutic range. Increased from 2.4 to 3 in 24 hours. Hold warfarin today --Daily  INR per protocol  Sarah Sweeney 07/05/2024,7:10 AM

## 2024-07-05 NOTE — Progress Notes (Signed)
 Progress Note   Patient: Sarah Sweeney FMW:[id number] DOB: [birth year] DOA: 07/01/2024     4 DOS: the patient was seen and examined on 07/05/2024   Brief hospital course: 83 y.o. 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   Symptoms started about 1 week ago, patient started noticed increasing bilateral ankle swelling, and she gained about 7 pounds over 7 days.  Since Saturday, she started to feel increasing exertional dyspnea, she took as needed Lasix  for weight gain and she took Lasix  last Saturday and Sunday with some improvement however last night her symptoms became worse and could not lie flat because of shortness of breath and came to ED.  She denies any cough no fever or chills.  EMS arrived at her home and found her O2 saturation 84% on room air, and patient was found to speak in broken sentences and was placed on CPAP. ED Course: Afebrile, borderline bradycardia heart rate upper 50s, O2 saturation 98% on BiPAP.  Chest x-ray showed bilateral pulmonary congestion.  CTA showed right lower lobe chronic PE but no acute PE, and bilateral ground glass like changes compatible with pulmonary edema.  Blood work showed BUN 13 creatinine 0.8 bicarb 26K 4.1.  VBG 7.4/48/29.  11/19.  Patient still short of breath this morning.  Patient does not wear oxygen at home.  Patient admitted with CHF.  Continue IV Lasix . 11/20.  Continue IV Lasix . 11/21.  Cardiology increase Lasix  to 80 mg IV twice daily.  Still dropped saturations with ambulation today to 87%.  Assessment and Plan: * Fever 101 degrees.  Blood cultures x 2, urinalysis to reflex to culture if positive.  Chest x-ray did not show any pneumonia.  COVID test negative.  RSV negative.  Flu negative.  Acute on chronic diastolic CHF (congestive heart  failure) [facility]) Cardiology switching to oral Lasix  for tomorrow.  Continue Farxiga , Avapro , spironolactone .  Acute respiratory failure with hypoxia (HCC) Patient initially had respiratory distress requiring initial BiPAP.  As per nursing staff she become hypoxic with walking on room air down to 87% yesterday.  Continue trying to get off oxygen on a daily basis.  Acute kidney injury superimposed on CKD Creatinine went up to 1.14.  Creatinine upon admission 0.88.  Change diuresis over to oral Lasix  for tomorrow.  Paroxysmal atrial fibrillation (HCC) EKG shows paced rhythm.  On Coumadin .  Essential hypertension Holding amlodipine  with peripheral edema.  Now on oral Lasix , Avapro , Aldactone  and Farxiga .  History of pulmonary embolism On Coumadin   Myocardial injury Demand ischemia secondary to heart failure.  Overweight (BMI 25.0-29.9) BMI 25.16.        Subjective: Patient felt a little short of breath this morning.  Patient had a fever of 101.  Did have some burning on urination yesterday but not today.  No abdominal pain.  No diarrhea.  Admitted with CHF exacerbation.  Physical Exam: Vitals:   07/05/24 0500 07/05/24 0827 07/05/24 1232 07/05/24 1241  BP:  (!) 116/45 (!) 81/43 (!) 100/58  Pulse:  63 (!) 59 63  Resp:      Temp:  (!) 101.1 F (38.4 C) (!) 97.3 F (36.3 C)   TempSrc:      SpO2:  97% 100%   Weight: 66.5 kg     Height:       Physical Exam HENT:     Head: Normocephalic.  Mouth/Throat:     Pharynx: No oropharyngeal exudate.  Eyes:     General: Lids are normal.     Conjunctiva/sclera: Conjunctivae normal.  Cardiovascular:     Rate and Rhythm: Normal rate and regular rhythm.     Heart sounds: Normal heart sounds, S1 normal and S2 normal.  Pulmonary:     Breath sounds: Examination of the right-lower field reveals decreased breath sounds and rhonchi. Examination of the left-lower field reveals decreased breath sounds and rhonchi. Decreased breath sounds and  rhonchi present. No wheezing or rales.  Abdominal:     Palpations: Abdomen is soft.     Tenderness: There is no abdominal tenderness.  Musculoskeletal:     Right lower leg: No swelling.     Left lower leg: No swelling.  Skin:    General: Skin is warm.     Findings: No rash.  Neurological:     Mental Status: She is alert and oriented to person, place, and time.     Data Reviewed: Sodium 140, potassium 3.2, CO2 35, creatinine up to 1.14, GFR 48  Family Communication: Spoke with sister on the phone  Disposition: Status is: Inpatient Remains inpatient appropriate because: Patient had a fever of 101 today.  Blood cultures, urine analysis and reflex to culture, chest x-ray does not show pneumonia.  Empiric Rocephin  and Zithromax  started.  Planned Discharge Destination: Home with Home Health    Time spent: 28 minutes  Author: Charlie Patterson, MD 07/05/2024 3:18 PM  For on call review [email or web address].

## 2024-07-06 ENCOUNTER — Encounter: Payer: Self-pay | Admitting: Internal Medicine

## 2024-07-06 ENCOUNTER — Other Ambulatory Visit: Payer: Self-pay

## 2024-07-06 DIAGNOSIS — I5033 Acute on chronic diastolic (congestive) heart failure: Secondary | ICD-10-CM | POA: Diagnosis not present

## 2024-07-06 DIAGNOSIS — N3001 Acute cystitis with hematuria: Secondary | ICD-10-CM | POA: Insufficient documentation

## 2024-07-06 DIAGNOSIS — J9601 Acute respiratory failure with hypoxia: Secondary | ICD-10-CM | POA: Diagnosis not present

## 2024-07-06 DIAGNOSIS — R509 Fever, unspecified: Secondary | ICD-10-CM | POA: Diagnosis not present

## 2024-07-06 LAB — PROTIME-INR
INR: 3.1 — ABNORMAL HIGH (ref 0.8–1.2)
Prothrombin Time: 33.7 s — ABNORMAL HIGH (ref 11.4–15.2)

## 2024-07-06 LAB — BASIC METABOLIC PANEL WITH GFR
Anion gap: 7 (ref 5–15)
BUN: 21 mg/dL (ref 8–23)
CO2: 35 mmol/L — ABNORMAL HIGH (ref 22–32)
Calcium: 8.3 mg/dL — ABNORMAL LOW (ref 8.9–10.3)
Chloride: 95 mmol/L — ABNORMAL LOW (ref 98–111)
Creatinine, Ser: 1.2 mg/dL — ABNORMAL HIGH (ref 0.44–1.00)
GFR, Estimated: 45 mL/min — ABNORMAL LOW (ref >60)
Glucose, Bld: 100 mg/dL — ABNORMAL HIGH (ref 70–99)
Potassium: 3.9 mmol/L (ref 3.5–5.1)
Sodium: 137 mmol/L (ref 135–145)

## 2024-07-06 LAB — CBC
HCT: 31.7 % — ABNORMAL LOW (ref 36.0–46.0)
Hemoglobin: 10 g/dL — ABNORMAL LOW (ref 12.0–15.0)
MCH: 28.4 pg (ref 26.0–34.0)
MCHC: 31.5 g/dL (ref 30.0–36.0)
MCV: 90.1 fL (ref 80.0–100.0)
Platelets: 208 K/uL (ref 150–400)
RBC: 3.52 MIL/uL — ABNORMAL LOW (ref 3.87–5.11)
RDW: 14.3 % (ref 11.5–15.5)
WBC: 7 K/uL (ref 4.0–10.5)
nRBC: 0 % (ref 0.0–0.2)

## 2024-07-06 LAB — GLUCOSE, CAPILLARY: Glucose-Capillary: 97 mg/dL (ref 70–99)

## 2024-07-06 MED ORDER — FUROSEMIDE 40 MG PO TABS
40.0000 mg | ORAL_TABLET | Freq: Every day | ORAL | 0 refills | Status: DC
  Filled 2024-07-06: qty 30, 30d supply, fill #0

## 2024-07-06 MED ORDER — SPIRONOLACTONE 25 MG PO TABS
25.0000 mg | ORAL_TABLET | Freq: Every day | ORAL | 0 refills | Status: DC
  Filled 2024-07-06: qty 30, 30d supply, fill #0

## 2024-07-06 MED ORDER — EMPAGLIFLOZIN 10 MG PO TABS: 10.0000 mg | ORAL_TABLET | Freq: Every day | ORAL | 0 refills | Status: DC

## 2024-07-06 MED ORDER — CEPHALEXIN 500 MG PO CAPS
500.0000 mg | ORAL_CAPSULE | Freq: Two times a day (BID) | ORAL | 0 refills | Status: AC
  Filled 2024-07-06: qty 6, 3d supply, fill #0

## 2024-07-06 MED ORDER — DAPAGLIFLOZIN PROPANEDIOL 10 MG PO TABS
10.0000 mg | ORAL_TABLET | Freq: Every day | ORAL | 0 refills | Status: DC
  Filled 2024-07-06: qty 30, 30d supply, fill #0

## 2024-07-06 MED ORDER — WARFARIN SODIUM 2 MG PO TABS
2.0000 mg | ORAL_TABLET | Freq: Once | ORAL | Status: DC
  Filled 2024-07-06: qty 1

## 2024-07-06 NOTE — Plan of Care (Signed)

## 2024-07-06 NOTE — Consult Note (Signed)
 PHARMACY - ANTICOAGULATION CONSULT NOTE  Pharmacy Consult for Warfarin Indication: atrial fibrillation  Patient Measurements: Height: 5' 4 (162.6 cm) Weight: 64.6 kg (142 lb 8 oz) IBW/kg (Calculated) : 54.7 HEPARIN  DW (KG): 68.8  Labs: Recent Labs    07/04/24 0423 07/05/24 0434 07/06/24 0451  HGB 10.0*  --  10.0*  HCT 31.4*  --  31.7*  PLT 220  --  208  LABPROT 26.9* 32.8* 33.7*  INR 2.4* 3.0* 3.1*  CREATININE 1.02* 1.14* 1.20*    Estimated Creatinine Clearance: 31.2 mL/min (A) (by C-G formula based on SCr of 1.2 mg/dL (H)).   Medical History: Past Medical History:  Diagnosis Date   Allergy    Apical variant hypertrophic cardiomyopathy (HCC)    Atrial fibrillation and flutter (HCC)    Diabetes mellitus without complication (HCC)    Erythrocytosis 03/02/2015   GERD (gastroesophageal reflux disease)    Headache    Hypertension    Pulmonary embolism (HCC) 2013   Coumadin  Therapy   Sleep apnea    CPAP   Symptomatic bradycardia    s/p single-chamber pacemaker (08/2021 - Dr. Cindie)    Assessment: 83 year-old female with Afib/aflutter, hx of PE (2017), and HF. She presented with SOB and ankle swelling and is currently being diuresed with Lasix  40 IV BID. Hgb dropped from 10.8 on 11/18 to 10.0 today, but no signs or symptoms of bleeding noted. Platelets remain stable. Volume overload can cause INR to be elevated, and as the patient is diuresed INR may trend down. Baseline INR was subtherapeutic. No major DDI on med list. CHADSVASC score of 8 (>75, female, HTN, PE hx, DM, CHF).   Home regimen: warfarin 2 mg on Mondays and 3 mg on all other days  Goal of Therapy:  INR 2-3 Monitor platelets by anticoagulation protocol: Yes  Date INR Warfarin Dose   11/18 1.7 4 mg   11/19 2.1 2 mg  11/20 2.1 3 mg  11/21 2.4 3 mg  11/22 3.0 HOLD  11/23 3.1    Plan:  --INR is slightly supratherapeutic but stabilized with held dose --Give warfarin 2 mg tonight --Daily INR per  protocol  Sarah Sweeney 07/06/2024,7:07 AM

## 2024-07-06 NOTE — Progress Notes (Signed)
 SATURATION QUALIFICATIONS: (This note is used to comply with regulatory documentation for home oxygen)  Patient Saturations on Room Air at Rest = 95%  Patient Saturations on Room Air while Ambulating = 92%   This patient does qualify for oxygen at this time.

## 2024-07-06 NOTE — Discharge Summary (Signed)
 Physician Discharge Summary   Patient: Sarah Sweeney MRN: [id number] DOB: 15-Jun-1941  Admit date:     07/01/2024  Discharge date: 07/06/24  Discharge Physician: Charlie Patterson   PCP: Epifanio Alm SQUIBB, MD   Recommendations at discharge:   Follow-up PCP 5 days Follow-up heart failure clinic  Discharge Diagnoses: Principal Problem:   Fever Active Problems:   Acute on chronic diastolic CHF (congestive heart failure) (HCC)   Acute respiratory failure with hypoxia (HCC)   Acute kidney injury superimposed on CKD   Paroxysmal atrial fibrillation (HCC)   Essential hypertension   History of pulmonary embolism   Myocardial injury   Paced cardiac rhythm   Shortness of breath   Overweight (BMI 25.0-29.9)   Acute cystitis with hematuria    Hospital Course: 83 y.o. 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   Symptoms started about 1 week ago, patient started noticed increasing bilateral ankle swelling, and she gained about 7 pounds over 7 days.  Since Saturday, she started to feel increasing exertional dyspnea, she took as needed Lasix  for weight gain and she took Lasix  last Saturday and Sunday with some improvement however last night her symptoms became worse and could not lie flat because of shortness of breath and came to ED.  She denies any cough no fever or chills.  EMS arrived at her home and found her O2 saturation 84% on room air, and patient was found to speak in broken sentences and was placed on CPAP. ED Course: Afebrile, borderline bradycardia heart rate upper 50s, O2 saturation 98% on BiPAP.  Chest x-ray showed bilateral pulmonary congestion.  CTA showed right lower lobe chronic PE but no acute PE, and bilateral ground glass like changes compatible with pulmonary edema.  Blood  work showed BUN 13 creatinine 0.8 bicarb 26K 4.1.  VBG 7.4/48/29.  11/19.  Patient still short of breath this morning.  Patient does not wear oxygen at home.  Patient admitted with CHF.  Continue IV Lasix . 11/20.  Continue IV Lasix . 11/21.  Cardiology increase Lasix  to 80 mg IV twice daily.  Still dropped saturations with ambulation today to 87%.  Assessment and Plan: * Fever Acute cystitis with hematuria.  Fever 101 degrees on 11/22.  Blood cultures so far no growth.   COVID test negative.  RSV negative.  Flu negative.  Patient empirically given Rocephin  and Zithromax .  Patient felt much better after antibiotic yesterday.  No further fever today.  Will discharge home after Rocephin  dose today and on Keflex  for few more days.  Acute on chronic diastolic CHF (congestive heart failure) (HCC) Patient was diuresed with Lasix  40 mg IV twice daily and then 1 day of 80 mg twice daily.  Cardiology switched to oral Lasix  daily for discharge home.  Continue Olmesartan . Spironolactone  prescribed.  I had to prescribe Jardiance  into a mail away pharmacy secondary to prohibitive cost at local pharmacies.  Follow-up at CHF clinic.  Acute respiratory failure with hypoxia (HCC) Patient initially had respiratory distress requiring initial BiPAP.  As per nursing staff she become hypoxic with walking on room air down to 87% 2 days ago.  Patient able to hold her saturations off oxygen today.  Acute kidney injury superimposed on CKD Creatinine went up to 1.20.  Creatinine upon admission 0.88.  Changed over to oral diuretics.  Recommend checking a BMP with follow-up appointment.  Paroxysmal atrial fibrillation (HCC) EKG shows paced rhythm.  On Coumadin .  Essential hypertension Holding amlodipine  with peripheral edema.  Now on oral Lasix , Benicar , Aldactone  and will be on Jardiance  as outpatient.  History of pulmonary embolism On Coumadin   Myocardial injury Demand ischemia secondary to heart  failure.  Overweight (BMI 25.0-29.9) On presentation.  BMI now down into the normal range with a BMI of 24.46.         Consultants: Cardiology Procedures performed: None Disposition: Home Diet recommendation:  Cardiac diet DISCHARGE MEDICATION: Allergies as of 07/06/2024   No Known Allergies      Medication List     STOP taking these medications    amLODipine  2.5 MG tablet Commonly known as: NORVASC        TAKE these medications    acetaminophen  325 MG tablet Commonly known as: TYLENOL  Take 1-2 tablets (325-650 mg total) by mouth every 4 (four) hours as needed for mild pain.   alendronate 70 MG tablet Commonly known as: FOSAMAX Take 70 mg by mouth once a week.   atorvastatin  20 MG tablet Commonly known as: LIPITOR TAKE 1 TABLET BY MOUTH DAILY   B-12 PO Take by mouth daily.   cephALEXin  500 MG capsule Commonly known as: KEFLEX  Take 1 capsule (500 mg total) by mouth 2 (two) times daily for 3 days. Start taking on: July 07, 2024   Denta 5000 Plus 1.1 % Crea dental cream Generic drug: sodium fluoride Take 1 Application by mouth daily.   diclofenac  Sodium 1 % Gel Commonly known as: VOLTAREN  Apply 1 application topically 2 (two) times daily as needed (pain).   empagliflozin  10 MG Tabs tablet Commonly known as: Jardiance  Take 1 tablet (10 mg total) by mouth daily before breakfast.   furosemide  40 MG tablet Commonly known as: LASIX  Take 1 tablet (40 mg total) by mouth daily. Start taking on: July 07, 2024 What changed:  medication strength how much to take   latanoprost 0.005 % ophthalmic solution Commonly known as: XALATAN Place 1 drop into both eyes at bedtime.   metFORMIN 500 MG 24 hr tablet Commonly known as: GLUCOPHAGE-XR Take 500 mg by mouth daily with breakfast.   olmesartan  40 MG tablet Commonly known as: BENICAR  TAKE ONE TABLET (40 MG) BY MOUTH ONCE DAILY   omeprazole 40 MG capsule Commonly known as: PRILOSEC Take 40 mg  by mouth daily.   oxybutynin  15 MG 24 hr tablet Commonly known as: DITROPAN  XL Take 15 mg by mouth at bedtime.   spironolactone  25 MG tablet Commonly known as: ALDACTONE  Take 1 tablet (25 mg total) by mouth daily. Start taking on: July 07, 2024   Vitamin D (Ergocalciferol) 1.25 MG (50000 UNIT) Caps capsule Commonly known as: DRISDOL Take 50,000 Units by mouth once a week.   warfarin 3 MG tablet Commonly known as: COUMADIN  Take 3 mg by mouth daily. Taking 2 mg Mon, Wed, Fri and 3 mg on Sun, Tu, Th, Sat        Follow-up Information     [facility]. Go on 07/14/2024.   Specialty: Cardiology Why: Hospital Follow-Up 07/14/2024 @ 10:30 AM Bring all medications to follow-up appointment with you. [facility], [address], Second Floor Free Valet Parking at the [employer] information: [address] [place]  [address] [phone]               Discharge Exam: Fredricka Weights   07/04/24 1010 07/05/24 0500 07/06/24  0526  Weight: 66.7 kg 66.5 kg 64.6 kg   Physical Exam HENT:     Head: Normocephalic.     Mouth/Throat:     Pharynx: No oropharyngeal exudate.  Eyes:     General: Lids are normal.     Conjunctiva/sclera: Conjunctivae normal.  Cardiovascular:     Rate and Rhythm: Normal rate and regular rhythm.     Heart sounds: Normal heart sounds, S1 normal and S2 normal.  Pulmonary:     Breath sounds: Examination of the right-lower field reveals decreased breath sounds. Examination of the left-lower field reveals decreased breath sounds. Decreased breath sounds present. No wheezing, rhonchi or rales.  Abdominal:     Palpations: Abdomen is soft.     Tenderness: There is no abdominal tenderness.  Musculoskeletal:     Right lower leg: No swelling.     Left lower leg: No swelling.  Skin:    General: Skin is warm.     Findings: No rash.  Neurological:     Mental Status: She is alert  and oriented to person, place, and time.      Condition at discharge: stable  The results of significant diagnostics from this hospitalization (including imaging, microbiology, ancillary and laboratory) are listed below for reference.   Imaging Studies: DG Chest Port 1 View Result Date: 07/05/2024 EXAM: 1 VIEW(S) XRAY OF THE CHEST 07/05/2024 10:13:38 AM COMPARISON: 07/02/2024 CLINICAL HISTORY: Dyspnea FINDINGS: LUNGS AND PLEURA: Mild interstitial prominence. No pleural effusion. No pneumothorax. HEART AND MEDIASTINUM: Cardiomegaly. Left chest cardiac pacing device in place. Atherosclerotic calcifications of the aorta. BONES AND SOFT TISSUES: No acute osseous abnormality. IMPRESSION: 1. Mild interstitial prominence without frank interstitial edema or pleural effusion. . Electronically signed by: Waddell Calk MD 07/05/2024 10:29 AM EST RP Workstation: [id number]   DG Chest 1 View Result Date: 07/02/2024 EXAM: 1 VIEW(S) XRAY OF THE CHEST 07/02/2024 07:14:16 AM COMPARISON: 02/14/2022 CLINICAL HISTORY: CHF (congestive heart failure) (HCC) FINDINGS: LINES, TUBES AND DEVICES: Stable left-sided pacemaker. LUNGS AND PLEURA: No focal pulmonary opacity. No pleural effusion. No pneumothorax. HEART AND MEDIASTINUM: Stable cardiomediastinal silhouette. Stable left-sided pacemaker. BONES AND SOFT TISSUES: No acute osseous abnormality. IMPRESSION: 1. Stable cardiomediastinal silhouette. Electronically signed by: Lynwood Seip MD 07/02/2024 07:19 AM EST RP Workstation: [id number]   ECHOCARDIOGRAM COMPLETE Result Date: 07/02/2024    ECHOCARDIOGRAM REPORT   Patient Name:   PATRENA SANTALUCIA Date of Exam: 07/01/2024 Medical Rec #:  [id number]       Height:       64.0 in Accession #:    [id number]      Weight:       154.1 lb Date of Birth:  February 27, 1941       BSA:          1.751 m Patient Age:    83 years        BP:           157/61 mmHg Patient Gender: F               HR:           61 bpm. Exam Location:  [facility] Procedure:  2D Echo, Cardiac Doppler and Color Doppler (Both Spectral and Color            Flow Doppler were utilized during procedure). Indications:     I50.31 Acute Diastolic CHF  History:         Patient has prior history of Echocardiogram examinations, most  recent 07/21/2021. Arrythmias:Atrial Fibrillation and Atrial                  Flutter; Risk Factors:Diabetes, Hypertension and Sleep Apnea.                  Pulmonary Embolism.  Sonographer:     Carl Coma RDCS Referring Phys:  [id number] CORT ONEIDA MANA Diagnosing Phys: Evalene Lunger MD IMPRESSIONS  1. Left ventricular ejection fraction, by estimation, is 55 to 60%. The left ventricle has normal function. The left ventricle has no regional wall motion abnormalities. Left ventricular diastolic parameters are indeterminate.  2. Right ventricular systolic function is normal. The right ventricular size is normal. There is mildly elevated pulmonary artery systolic pressure. The estimated right ventricular systolic pressure is 43.4 mmHg.  3. Left atrial size was mildly dilated.  4. The mitral valve is normal in structure. Mild mitral valve regurgitation. No evidence of mitral stenosis.  5. Tricuspid valve regurgitation is mild to moderate.  6. The aortic valve is normal in structure. Aortic valve regurgitation is not visualized. No aortic stenosis is present.  7. The inferior vena cava is dilated in size with >50% respiratory variability, suggesting right atrial pressure of 8 mmHg. FINDINGS  Left Ventricle: Left ventricular ejection fraction, by estimation, is 55 to 60%. The left ventricle has normal function. The left ventricle has no regional wall motion abnormalities. Strain was performed and the global longitudinal strain is indeterminate. The left ventricular internal cavity size was normal in size. There is no left ventricular hypertrophy. Left ventricular diastolic parameters are indeterminate. Right Ventricle: The right ventricular size is  normal. No increase in right ventricular wall thickness. Right ventricular systolic function is normal. There is mildly elevated pulmonary artery systolic pressure. The tricuspid regurgitant velocity is 2.89  m/s, and with an assumed right atrial pressure of 10 mmHg, the estimated right ventricular systolic pressure is 43.4 mmHg. Left Atrium: Left atrial size was mildly dilated. Right Atrium: Right atrial size was normal in size. Pericardium: There is no evidence of pericardial effusion. Mitral Valve: The mitral valve is normal in structure. Mild mitral valve regurgitation. No evidence of mitral valve stenosis. Tricuspid Valve: The tricuspid valve is normal in structure. Tricuspid valve regurgitation is mild to moderate. No evidence of tricuspid stenosis. Aortic Valve: The aortic valve is normal in structure. Aortic valve regurgitation is not visualized. No aortic stenosis is present. Pulmonic Valve: The pulmonic valve was normal in structure. Pulmonic valve regurgitation is not visualized. No evidence of pulmonic stenosis. Aorta: The aortic root is normal in size and structure. Venous: The inferior vena cava is dilated in size with greater than 50% respiratory variability, suggesting right atrial pressure of 8 mmHg. IAS/Shunts: No atrial level shunt detected by color flow Doppler. Additional Comments: 3D was performed not requiring image post processing on an independent workstation and was indeterminate.  LEFT VENTRICLE PLAX 2D LVIDd:         5.10 cm   Diastology LVIDs:         3.60 cm   LV e' medial:    5.76 cm/s LV PW:         0.90 cm   LV E/e' medial:  15.0 LV IVS:        1.10 cm   LV e' lateral:   8.70 cm/s LVOT diam:     1.90 cm   LV E/e' lateral: 9.9 LV SV:         59 LV SV Index:  34 LVOT Area:     2.84 cm  RIGHT VENTRICLE             IVC RV Basal diam:  4.20 cm     IVC diam: 2.20 cm RV S prime:     11.00 cm/s TAPSE (M-mode): 2.0 cm LEFT ATRIUM             Index        RIGHT ATRIUM           Index LA  diam:        4.70 cm 2.68 cm/m   RA Area:     16.10 cm LA Vol (A2C):   55.3 ml 31.58 ml/m  RA Volume:   47.30 ml  27.01 ml/m LA Vol (A4C):   76.4 ml 43.63 ml/m LA Biplane Vol: 66.7 ml 38.09 ml/m  AORTIC VALVE LVOT Vmax:   102.10 cm/s LVOT Vmean:  62.850 cm/s LVOT VTI:    0.209 m  AORTA Ao Root diam: 3.10 cm Ao Asc diam:  3.50 cm MITRAL VALVE               TRICUSPID VALVE MV Area (PHT): 5.20 cm    TR Peak grad:   33.4 mmHg MV Decel Time: 146 msec    TR Vmax:        289.00 cm/s MV E velocity: 86.20 cm/s MV A velocity: 43.00 cm/s  SHUNTS MV E/A ratio:  2.00        Systemic VTI:  0.21 m                            Systemic Diam: 1.90 cm Evalene Lunger MD Electronically signed by Evalene Lunger MD Signature Date/Time: 07/02/2024/6:29:04 AM    Final    CT Angio Chest PE W and/or Wo Contrast Result Date: 07/01/2024 EXAM: CTA CHEST 07/01/2024 03:02:56 AM TECHNIQUE: CTA of the chest was performed without and with the administration of 75 mL of iohexol  (OMNIPAQUE ) 350 MG/ML injection. Multiplanar reformatted images are provided for review. MIP images are provided for review. Automated exposure control, iterative reconstruction, and/or weight based adjustment of the mA/kV was utilized to reduce the radiation dose to as low as reasonably achievable. COMPARISON: None available. CLINICAL HISTORY: History of PE, high risk, here with shortness of breath and sats of 84% on room air. FINDINGS: PULMONARY ARTERIES: Pulmonary arteries are adequately opacified for evaluation. Small web noted within the right lower lobar pulmonary artery keeping with remote pulmonary embolism. No acute pulmonary embolus identified. Main pulmonary artery is normal in caliber. MEDIASTINUM: Equivocal cardiac size within normal limits. Left subclavian single lead pacemaker in place with its lead within the intraventricular septum. No pericardial effusion. Mild multivessel coronary artery calcification. Mild atherosclerotic calcification within the  thoracic aorta. No aortic aneurysm. The thyroid  is unremarkable. LYMPH NODES: Shotty mediastinal adenopathy nonspecific which limits measuring up to 12 mm in short axis diameter. No hilar or axillary lymphadenopathy. LUNGS AND PLEURA: There is extensive patchy bilateral ground-glass pulmonary infiltrate with associated smooth interlobular septal thickening in keeping with moderate pulmonary edema. Small bilateral pleural effusions are present. The findings, together, may reflect changes of moderate pulmonary edema. 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 Similar AP narrowing of the bronchus intermedius suggests some bronchial in this location. No pneumothorax. UPPER ABDOMEN: Limited images of the upper abdomen are  unremarkable. SOFT TISSUES AND BONES: Osseous structures are age appropriate. No acute soft tissue abnormality. IMPRESSION: 1. No acute pulmonary embolism. Small web in the right lower lobar pulmonary artery consistent with remote pulmonary embolism 2. Extensive patchy bilateral ground-glass opacities with smooth interlobular septal thickening, consistent with moderate pulmonary edema, with small bilateral pleural effusions. Altogether, the findings are in keeping with changes of moderate cardiogenic failure. 3. 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 EXAM: CT ABDOMEN AND PELVIS WITH CONTRAST TECHNIQUE: Multidetector CT imaging of the abdomen and pelvis was performed using the standard protocol following bolus administration of intravenous contrast. RADIATION DOSE REDUCTION: This exam was performed according to the  departmental dose-optimization program which includes automated exposure control, adjustment of the mA and/or kV according to patient size and/or use of iterative reconstruction technique. CONTRAST:  OMNIPAQUE  IOHEXOL  300 MG/ML  SOLN COMPARISON:  CT scan abdomen and pelvis from 03/06/2014. FINDINGS: Lower chest: There is a stable sub 4 mm noncalcified nodule in the left lung lower lobe (series 4, image 6), favoring benign etiology. The lung bases are otherwise clear. No pleural effusion. The heart is normal in size. No pericardial effusion. Hepatobiliary: The liver is normal in size. Non-cirrhotic configuration. There are at least 4 (marked with electronic arrow sign on series 2), ill-defined hypoattenuating foci in the liver with largest measuring up to 9 x 10 mm. These can not be characterized as simple cysts on the basis of this exam. The lesion in the right hepatic lobe, segment 5 (series 2, image 34) is present since the prior study from 2015. However, other lesions are new. Further evaluation with nonemergent MRI abdomen as per liver mass protocol is recommended. No intrahepatic or extrahepatic bile duct dilation. The gallbladder is contracted around multiple peripherally rim calcified gallstones. There is trace amount of fluid between the gallbladder wall and liver surface. No pericholecystic fat stranding. Acute cholecystitis is considered less likely however, correlate clinically determine the need for additional imaging with ultrasound or nuclear medicine HIDA scan. Pancreas: Mild-to-moderate atrophy of pancreas which is otherwise unremarkable. Spleen: Within normal limits. No focal lesion. Adrenals/Urinary Tract: Adrenal glands are unremarkable. No suspicious renal mass. There are several sinus cysts in the left kidney. No hydroureteronephrosis or nephroureterolithiasis on either side. Urinary bladder is under distended, precluding optimal assessment. However, no large mass or stones identified. No  perivesical fat stranding. Stomach/Bowel: There is short segment of proximal sigmoid colon (approximately 5 cm long), exhibiting mild irregular wall thickening and pericolonic fat stranding on the background of several diverticula, compatible with acute uncomplicated diverticulitis. No walled off abscess or loculated collection. No pneumoperitoneum. No disproportionate dilation of small or large bowel loops to suggest bowel obstruction. The appendix is not distinctly visualized. There are at least 2 adjacent diverticula arising from the second part of duodenum. Vascular/Lymphatic: No ascites or pneumoperitoneum. No abdominal or pelvic lymphadenopathy, by size criteria. No aneurysmal dilation of the major abdominal arteries. There are moderate peripheral atherosclerotic vascular calcifications of the aorta and its major branches. Reproductive: Small/atrophic uterus versus supracervical hysterectomy. Correlate with surgical history. No large adnexal mass. Other: The visualized soft tissues and abdominal wall are unremarkable. Musculoskeletal: No suspicious osseous lesions. There are mild multilevel degenerative changes in the visualized spine. IMPRESSION: 1. Findings compatible  with acute uncomplicated proximal sigmoid colon diverticulitis. 2. There are at least 4 ill-defined hypoattenuating foci in the liver, which can not be characterized as simple cysts on the basis of this exam. Further evaluation with nonemergent MRI abdomen as per liver mass protocol is recommended. 3. Multiple other nonacute observations, as described above. Aortic Atherosclerosis (ICD10-I70.0). Electronically Signed   By: Ree Molt M.D.   On: 06/13/2024 15:46    Microbiology: Results for orders placed or performed during the hospital encounter of 07/01/24  Resp panel by RT-PCR (RSV, Flu A&B, Covid) Anterior Nasal Swab     Status: None   Collection Time: 07/01/24  2:29 AM   Specimen: Anterior Nasal Swab  Result Value Ref Range  Status   SARS Coronavirus 2 by RT PCR NEGATIVE NEGATIVE Final    Comment: (NOTE) SARS-CoV-2 target nucleic acids are NOT DETECTED.  The SARS-CoV-2 RNA is generally detectable in upper respiratory specimens during the acute phase of infection. The lowest concentration of SARS-CoV-2 viral copies this assay can detect is 138 copies/mL. A negative result does not preclude SARS-Cov-2 infection and should not be used as the sole basis for treatment or other patient management decisions. A negative result may occur with  improper specimen collection/handling, submission of specimen other than nasopharyngeal swab, presence of viral mutation(s) within the areas targeted by this assay, and inadequate number of viral copies(<138 copies/mL). A negative result must be combined with clinical observations, patient history, and epidemiological information. The expected result is Negative.  Fact Sheet for Patients:  bloggercourse.com  Fact Sheet for Healthcare Providers:  seriousbroker.it  This test is no t yet approved or cleared by the [place]  FDA and  has been authorized for detection and/or diagnosis of SARS-CoV-2 by FDA under an Emergency Use Authorization (EUA). This EUA will remain  in effect (meaning this test can be used) for the duration of the COVID-19 declaration under Section 564(b)(1) of the Act, 21 U.S.C.section 360bbb-3(b)(1), unless the authorization is terminated  or revoked sooner.       Influenza A by PCR NEGATIVE NEGATIVE Final   Influenza B by PCR NEGATIVE NEGATIVE Final    Comment: (NOTE) The Xpert Xpress SARS-CoV-2/FLU/RSV plus assay is intended as an aid in the diagnosis of influenza from Nasopharyngeal swab specimens and should not be used as a sole basis for treatment. Nasal washings and aspirates are unacceptable for Xpert Xpress SARS-CoV-2/FLU/RSV testing.  Fact Sheet for  Patients: bloggercourse.com  Fact Sheet for Healthcare Providers: seriousbroker.it  This test is not yet approved or cleared by the [place]  FDA and has been authorized for detection and/or diagnosis of SARS-CoV-2 by FDA under an Emergency Use Authorization (EUA). This EUA will remain in effect (meaning this test can be used) for the duration of the COVID-19 declaration under Section 564(b)(1) of the Act, 21 U.S.C. section 360bbb-3(b)(1), unless the authorization is terminated or revoked.     Resp Syncytial Virus by PCR NEGATIVE NEGATIVE Final    Comment: (NOTE) Fact Sheet for Patients: bloggercourse.com  Fact Sheet for Healthcare Providers: seriousbroker.it  This test is not yet approved or cleared by the [place]  FDA and has been authorized for detection and/or diagnosis of SARS-CoV-2 by FDA under an Emergency Use Authorization (EUA). This EUA will remain in effect (meaning this test can be used) for the duration of the COVID-19 declaration under Section 564(b)(1) of the Act, 21 U.S.C. section 360bbb-3(b)(1), unless the authorization is terminated or revoked.  Performed at [facility], [phone]  [address] [place], [place] [address]   Resp panel by RT-PCR (RSV, Flu A&B, Covid) Anterior Nasal Swab     Status: None   Collection Time: 07/05/24 11:22 AM   Specimen: Anterior Nasal Swab  Result Value Ref Range Status   SARS Coronavirus 2 by RT PCR NEGATIVE NEGATIVE Final    Comment: (NOTE) SARS-CoV-2 target nucleic acids are NOT DETECTED.  The SARS-CoV-2 RNA is generally detectable in upper respiratory specimens during the acute phase of infection. The lowest concentration of SARS-CoV-2 viral copies this assay can detect is 138 copies/mL. A negative result does not preclude SARS-Cov-2 infection and should not be used as the sole basis for treatment or other  patient management decisions. A negative result may occur with  improper specimen collection/handling, submission of specimen other than nasopharyngeal swab, presence of viral mutation(s) within the areas targeted by this assay, and inadequate number of viral copies(<138 copies/mL). A negative result must be combined with clinical observations, patient history, and epidemiological information. The expected result is Negative.  Fact Sheet for Patients:  bloggercourse.com  Fact Sheet for Healthcare Providers:  seriousbroker.it  This test is no t yet approved or cleared by the [place]  FDA and  has been authorized for detection and/or diagnosis of SARS-CoV-2 by FDA under an Emergency Use Authorization (EUA). This EUA will remain  in effect (meaning this test can be used) for the duration of the COVID-19 declaration under Section 564(b)(1) of the Act, 21 U.S.C.section 360bbb-3(b)(1), unless the authorization is terminated  or revoked sooner.       Influenza A by PCR NEGATIVE NEGATIVE Final   Influenza B by PCR NEGATIVE NEGATIVE Final    Comment: (NOTE) The Xpert Xpress SARS-CoV-2/FLU/RSV plus assay is intended as an aid in the diagnosis of influenza from Nasopharyngeal swab specimens and should not be used as a sole basis for treatment. Nasal washings and aspirates are unacceptable for Xpert Xpress SARS-CoV-2/FLU/RSV testing.  Fact Sheet for Patients: bloggercourse.com  Fact Sheet for Healthcare Providers: seriousbroker.it  This test is not yet approved or cleared by the [place]  FDA and has been authorized for detection and/or diagnosis of SARS-CoV-2 by FDA under an Emergency Use Authorization (EUA). This EUA will remain in effect (meaning this test can be used) for the duration of the COVID-19 declaration under Section 564(b)(1) of the Act, 21 U.S.C. section  360bbb-3(b)(1), unless the authorization is terminated or revoked.     Resp Syncytial Virus by PCR NEGATIVE NEGATIVE Final    Comment: (NOTE) Fact Sheet for Patients: bloggercourse.com  Fact Sheet for Healthcare Providers: seriousbroker.it  This test is not yet approved or cleared by the [place]  FDA and has been authorized for detection and/or diagnosis of SARS-CoV-2 by FDA under an Emergency Use Authorization (EUA). This EUA will remain in effect (meaning this test can be used) for the duration of the COVID-19 declaration under Section 564(b)(1) of the Act, 21 U.S.C. section 360bbb-3(b)(1), unless the authorization is terminated or revoked.  Performed at [facility], [address] [place], [place] [address]   Culture, blood (Routine X 2) w Reflex to ID Panel     Status: None (Preliminary result)   Collection Time: 07/05/24 12:28 PM   Specimen: BLOOD  Result Value Ref Range Status   Specimen Description BLOOD BLOOD LEFT ARM  Final   Special Requests   Final    BLC Blood Culture results may not be optimal due to an excessive volume of blood received in culture  bottles   Culture   Final    NO GROWTH < 24 HOURS Performed at [facility], [address] [place], [place] [address]    Report Status PENDING  Incomplete  Culture, blood (Routine X 2) w Reflex to ID Panel     Status: None (Preliminary result)   Collection Time: 07/05/24 12:28 PM   Specimen: BLOOD  Result Value Ref Range Status   Specimen Description BLOOD RIGHT ANTECUBITAL  Final   Special Requests   Final    BLC Blood Culture results may not be optimal due to an excessive volume of blood received in culture bottles   Culture   Final    NO GROWTH < 24 HOURS Performed at [facility], [address] [place], [place] [address]    Report Status PENDING  Incomplete    Labs: CBC: Recent Labs  Lab 07/01/24 0229  07/04/24 0423 07/06/24 0451  WBC 7.4 5.8 7.0  NEUTROABS 5.5  --   --   HGB 10.8* 10.0* 10.0*  HCT 34.6* 31.4* 31.7*  MCV 91.8 89.5 90.1  PLT 227 220 208   Basic Metabolic Panel: Recent Labs  Lab 07/02/24 0432 07/03/24 0347 07/04/24 0423 07/05/24 0434 07/06/24 0451  NA 142 141 140 140 137  K 3.8 3.7 4.0 3.2* 3.9  CL 102 98 98 94* 95*  CO2 32 33* 32 35* 35*  GLUCOSE 93 91 87 87 100*  BUN 13 17 19 18 21   CREATININE 1.02* 1.02* 1.02* 1.14* 1.20*  CALCIUM  8.6* 8.6* 8.3* 8.6* 8.3*   Liver Function Tests: No results for input(s): AST, ALT, ALKPHOS, BILITOT, PROT, ALBUMIN in the last 168 hours. CBG: Recent Labs  Lab 07/04/24 2134 07/05/24 0824 07/05/24 1232 07/05/24 2041 07/06/24 1239  GLUCAP 192* 138* 115* 161* 97    Discharge time spent: greater than 30 minutes.  Signed: Charlie Patterson, MD [facility] 07/06/2024

## 2024-07-06 NOTE — Discharge Instructions (Signed)
 Had to send Jardiance  into a mail order pharmacy in order to get a cheaper price

## 2024-07-06 NOTE — Plan of Care (Signed)

## 2024-07-07 LAB — URINE CULTURE: Culture: <10000 — AB

## 2024-07-07 LAB — GLUCOSE, CAPILLARY: Glucose-Capillary: 93 mg/dL (ref 70–99)

## 2024-07-07 NOTE — Progress Notes (Unsigned)
 Electrophysiology Clinic Note    Date:  07/08/2024  Patient ID:  Sarah Sweeney, Sarah Sweeney Apr 04, 1941, MRN [id number] PCP:  Epifanio Alm SQUIBB, MD  Cardiologist:  Lonni Hanson, MD  Electrophysiologist:  OLE ONEIDA HOLTS, MD  Electrophysiology APP:  Etienne Mowers, NP     Discussed the use of AI scribe software for clinical note transcription with the patient, who gave verbal consent to proceed.   Patient Profile    Chief Complaint: PPM follow-up  History of Present Illness: Sarah Sweeney is a 83 y.o. female with PMH notable for permanent Afib, apical variant HCM, symptomatic bradycardia s/p PPM, OSA on CPAP, HTN, HLD; seen today for OLE ONEIDA HOLTS, MD for routine electrophysiology followup.   I last saw her 11/2022 where she had increased lower extremity edema. She was hospitalized 11/18-23 for CHF exacerbation, PNA. Amlodipine  held at discharge.   On follow-up today, she is doing very well since hospital discharge 2 days ago.  She believes that her weight is staying stable, she has not had increased lower extremity edema.  Her breathing has also improved. She denies chest pain, chest pressure, palpitations.  She continues to take Lasix  daily     Arrhythmia/Device History MDT single chamber PPM, imp 09/07/2021; dx perm Afib, bradycardia     ROS:  Please see the history of present illness. All other systems are reviewed and otherwise negative.    Physical Exam    VS:  BP 120/64 (BP Location: Left Arm, Patient Position: Sitting, Cuff Size: Normal)   Pulse 94   Ht 5' 4 (1.626 m)   Wt 143 lb 12.8 oz (65.2 kg)   SpO2 97%   BMI 24.68 kg/m  BMI: Body mass index is 24.68 kg/m.           Wt Readings from Last 3 Encounters:  07/08/24 143 lb 12.8 oz (65.2 kg)  07/06/24 142 lb 8 oz (64.6 kg)  04/25/24 151 lb 4 oz (68.6 kg)     GEN- The patient is well appearing, alert and oriented x 3 today.   Lungs- Clear to ausculation bilaterally, normal work of breathing.   Heart- Regular rate and rhythm, no murmurs, rubs or gallops Extremities- No peripheral edema, warm, dry Skin-  device pocket well-healed, no tethering   Device interrogation done today and reviewed by myself:  Battery 10 years Lead thresholds, impedence, sensing stable   Very brief NSVT episodes No changes made today   Studies Reviewed   Previous EP, cardiology notes.    EKG is not ordered. Personal review of EKG from 07/01/2024 shows:  Afib w intermittent VP        TTE, 07/01/2024  1. Left ventricular ejection fraction, by estimation, is 55 to 60%. The left ventricle has normal function. The left ventricle has no regional wall motion abnormalities. Left ventricular diastolic parameters are indeterminate.   2. Right ventricular systolic function is normal. The right ventricular size is normal. There is mildly elevated pulmonary artery systolic pressure. The estimated right ventricular systolic pressure is 43.4 mmHg.   3. Left atrial size was mildly dilated.   4. The mitral valve is normal in structure. Mild mitral valve regurgitation. No evidence of mitral stenosis.   5. Tricuspid valve regurgitation is mild to moderate.   6. The aortic valve is normal in structure. Aortic valve regurgitation is not visualized. No aortic stenosis is present.   7. The inferior vena cava is dilated in size with >50% respiratory variability, suggesting  right atrial pressure of 8 mmHg.   TTE, 02/15/2022  1. Left ventricular ejection fraction, by estimation, is 55 to 60%. The left ventricle has normal function. The left ventricle has no regional wall motion abnormalities. Left ventricular diastolic parameters were normal.   2. Right ventricular systolic function is normal. The right ventricular size is normal.   3. The mitral valve is grossly normal. No evidence of mitral valve regurgitation.   4. The aortic valve is grossly normal. Aortic valve regurgitation is not visualized.   5. The inferior vena  cava is normal in size with greater than 50% respiratory variability, suggesting right atrial pressure of 3 mmHg.    TTE, 07/21/2021  1. Left ventricular ejection fraction, by estimation, is 60 to 65%. The left ventricle has normal function. The left ventricle has no regional wall motion abnormalities. The left ventricular internal cavity size was mildly dilated. There is mild to  moderate concentric left ventricular hypertrophy with severe apical hypertrophy. Left ventricular diastolic parameters are indeterminate.   2. Right ventricular systolic function is normal. The right ventricular size is normal. There is moderately elevated pulmonary artery systolic pressure. The estimated right ventricular systolic pressure is 46.2 mmHg.   3. Left atrial size was mild to moderately dilated.   4. The mitral valve is normal in structure. Moderate mitral valve regurgitation. No evidence of mitral stenosis.   5. The aortic valve is normal in structure. Aortic valve regurgitation is not visualized. No aortic stenosis is present.   6. The inferior vena cava is normal in size with greater than 50% respiratory variability, suggesting right atrial pressure of 3 mmHg.   7. Rhythm is atrial fib/flutter   Comparison(s): LVEF 50-55%, apical hypertrophic cardiomyopathy.     Assessment and Plan     #) bradycardia s/p PPM Pacemaker functioning well, see Paceart for details High V pacing with LB area lead   #) Perm afib No cardiac awareness with ventricular rates well-controlled  #) Hypercoag d/t perm afib CHA2DS2-VASc Score = at least 7 [CHF History: 1, HTN History: 1, Diabetes History: 1, Stroke History: 0, Vascular Disease History: 1, Age Score: 2, Gender Score: 1].  Therefore, the patient's annual risk of stroke is 11.2 %.    Stroke ppx - warfarin, managed at PCP office No bleeding concerns    #) HFpEF Appears euvolemic on exam after recent hospitalization No lower extremity edema, patient states her  breathing has improved since hospitalization Continue 40 mg Lasix  daily, 40 mg olmesartan  daily, 25 mg Spiro She is awaiting refill of her Jardiance  and will restart it when she receives it Has follow-up next week with heart failure team       Current medicines are reviewed at length with the patient today.   The patient does not have concerns regarding her medicines.  The following changes were made today:  none  Labs/ tests ordered today include:  No orders of the defined types were placed in this encounter.    Disposition: Follow up with Dr. Kennyth or EP APP in 2 years , continue remote monitoring   Signed, Doren Kaspar, NP  07/08/24  12:19 PM  Electrophysiology [facility]

## 2024-07-08 ENCOUNTER — Encounter: Payer: Self-pay | Admitting: Cardiology

## 2024-07-08 ENCOUNTER — Ambulatory Visit: Attending: Cardiology | Admitting: Cardiology

## 2024-07-08 VITALS — BP 120/64 | HR 94 | Ht 64.0 in | Wt 143.8 lb

## 2024-07-08 DIAGNOSIS — R001 Bradycardia, unspecified: Secondary | ICD-10-CM | POA: Diagnosis not present

## 2024-07-08 DIAGNOSIS — I5030 Unspecified diastolic (congestive) heart failure: Secondary | ICD-10-CM

## 2024-07-08 DIAGNOSIS — I4821 Permanent atrial fibrillation: Secondary | ICD-10-CM

## 2024-07-08 DIAGNOSIS — Z950 Presence of cardiac pacemaker: Secondary | ICD-10-CM | POA: Diagnosis not present

## 2024-07-08 LAB — CUP PACEART INCLINIC DEVICE CHECK
Date Time Interrogation Session: 20251125123648
Implantable Lead Connection Status: 753985
Implantable Lead Implant Date: 20230125
Implantable Lead Location: 753860
Implantable Lead Model: 3830
Implantable Pulse Generator Implant Date: 20230125

## 2024-07-08 NOTE — Patient Instructions (Signed)
 Medication Instructions:  Your physician recommends that you continue on your current medications as directed. Please refer to the Current Medication list given to you today.  *If you need a refill on your cardiac medications before your next appointment, please call your pharmacy*  Lab Work: No labs ordered today  If you have labs (blood work) drawn today and your tests are completely normal, you will receive your results only by: MyChart Message (if you have MyChart) OR A paper copy in the mail If you have any lab test that is abnormal or we need to change your treatment, we will call you to review the results.  Testing/Procedures: No test ordered today   Follow-Up: At [facility], you and your health needs are our priority.  As part of our continuing mission to provide you with exceptional heart care, our providers are all part of one team.  This team includes your primary Cardiologist (physician) and Advanced Practice Providers or APPs (Physician Assistants and Nurse Practitioners) who all work together to provide you with the care you need, when you need it.  Your next appointment:   2 year(s)  Provider:   Suzann Riddle, NP    We recommend signing up for the patient portal called MyChart.  Sign up information is provided on this After Visit Summary.  MyChart is used to connect with patients for Virtual Visits (Telemedicine).  Patients are able to view lab/test results, encounter notes, upcoming appointments, etc.  Non-urgent messages can be sent to your provider as well.   To learn more about what you can do with MyChart, go to [email or web address].

## 2024-07-09 ENCOUNTER — Telehealth: Payer: Self-pay | Admitting: Family

## 2024-07-09 NOTE — Telephone Encounter (Signed)
 Called to confirm/remind patient of their appointment at the [facility] on 07/14/24.   Appointment:   [x] Confirmed  [] Left mess   [] No answer/No voice mail  [] VM Full/unable to leave message  [] Phone not in service  Patient reminded to bring all medications and/or complete list.  Confirmed patient has transportation. Gave directions, instructed to utilize valet parking.

## 2024-07-10 LAB — CULTURE, BLOOD (ROUTINE X 2)
Culture: NO GROWTH
Culture: NO GROWTH

## 2024-07-11 ENCOUNTER — Ambulatory Visit: Payer: Self-pay | Admitting: Cardiology

## 2024-07-13 NOTE — Progress Notes (Unsigned)
 Advanced [facility] Failure Clinic Note   Referring Physician: 11/25 admission PCP: Epifanio Alm SQUIBB, MD Cardiologist: Lonni Hanson, MD   Chief Complaint: fatigue   HPI:  Sarah Sweeney is a 83 y/o female with a history of HFpEF, permanent Afib, DCCV 07/17/19, apical variant HCM, symptomatic bradycardia s/p PPM 09/08/21, OSA on CPAP, HTN, HLD, T2DM, PE 2013.   Admitted 07/01/24 with progressive shortness of breath, orthopnea, and ankle swelling. On admission, proBNP was 4452, HS-troponin was 29, and A1c 6.7. Chest x-ray noted to be stable. CTA noted no acute PE, moderate pulmonary edema, bilateral pleural effusions. Echo 07/01/24: EF 55-60%, normal RV, mildly elevated PA pressure, mild LAE, mild MR, mild/ moderate TR. Cardiology consulted. IV diuresed. Developed fever and found to have acute cystitis. Symptoms improved with antibiotics. Initially needed BIPAP but able to be weaned off to room air.   She presents today for her initial TOC HF visit with a chief complaint of minimal fatigue. Has occasional dizziness when bending over at the waist. Overall feels much better than when she went into the hospital. Has upcoming bus trip to  [place] [place] [facility]   Weight at home ranges 139-141 pounds. Watching sodium/ fluid restriction. Denies tobacco, alcohol, drug use.   Pertinent cardiac history: Diagnosed with AF 02/2019. TTE 03/2019 noted LVEF 50-55%, severe asymmetric LVH of the apical wall consistent with apical hypertrophic CM, low-normal RV function, and mild to moderate MR. CMRI 04/2019 noted apical HCM, LGE of inferior RV insertion site. Underwent DCCV 07/2019. Presented in 05/2021 in [place] with slow ventricular response and eventually underwent PPM implant in 08/2021. TTE 02/2022 with LVEF of 55-60%.  Review of Systems: [y] = yes, [ ]  = no   General: Weight gain [ ] ; Weight loss [ ] ; Anorexia [ ] ; Fatigue Sarah Sweeney ]; Fever [ ] ; Chills [ ] ; Weakness [ ]   Cardiac: Chest pain/pressure [ ] ; Resting SOB [  ]; Exertional SOB [ ] ; Orthopnea [ ] ; Pedal Edema [ ] ; Palpitations [ ] ; Syncope [ ] ; Presyncope [ ] ; Paroxysmal nocturnal dyspnea[ ]   Pulmonary: Cough [ ] ; Wheezing[ ] ; Hemoptysis[ ] ; Sputum [ ] ; Snoring [ ]   GI: Vomiting[ ] ; Dysphagia[ ] ; Melena[ ] ; Hematochezia [ ] ; Heartburn[ ] ; Abdominal pain [ ] ; Constipation [ ] ; Diarrhea [ ] ; BRBPR [ ]   GU: Hematuria[ ] ; Dysuria [ ] ; Nocturia[ ]   Vascular: Pain in legs with walking [ ] ; Pain in feet with lying flat [ ] ; Non-healing sores [ ] ; Stroke [ ] ; TIA [ ] ; Slurred speech [ ] ;  Neuro: Dizziness[y ]; Vertigo[ ] ; Seizures[ ] ; Paresthesias[ ] ;Blurred vision [ ] ; Diplopia [ ] ; Vision changes [ ]   Ortho/Skin: Arthritis [ ] ; Joint pain [ ] ; Muscle pain [ ] ; Joint swelling [ ] ; Back Pain [ ] ; Rash [ ]   Psych: Depression[ ] ; Anxiety[ ]   Heme: Bleeding problems [ ] ; Clotting disorders [ ] ; Anemia [ ]   Endocrine: Diabetes [ y]; Thyroid  dysfunction[ ]    Past Medical History:  Diagnosis Date   Allergy    Apical variant hypertrophic cardiomyopathy (HCC)    Atrial fibrillation and flutter (HCC)    Diabetes mellitus without complication (HCC)    Erythrocytosis 03/02/2015   GERD (gastroesophageal reflux disease)    Headache    Hypertension    Pulmonary embolism (HCC) 2013   Coumadin  Therapy   Sleep apnea    CPAP   Symptomatic bradycardia    s/p single-chamber pacemaker (08/2021 - Dr. Cindie)    Current Outpatient Medications  Medication Sig Dispense  Refill   acetaminophen  (TYLENOL ) 325 MG tablet Take 1-2 tablets (325-650 mg total) by mouth every 4 (four) hours as needed for mild pain.     alendronate (FOSAMAX) 70 MG tablet Take 70 mg by mouth once a week.     atorvastatin  (LIPITOR) 20 MG tablet TAKE 1 TABLET BY MOUTH DAILY 90 tablet 3   Cyanocobalamin (B-12 PO) Take by mouth daily.     DENTA 5000 PLUS 1.1 % CREA dental cream Take 1 Application by mouth daily.     diclofenac  Sodium (VOLTAREN ) 1 % GEL Apply 1 application topically 2 (two) times daily  as needed (pain).     empagliflozin  (JARDIANCE ) 10 MG TABS tablet Take 1 tablet (10 mg total) by mouth daily before breakfast. 100 tablet 0   furosemide  (LASIX ) 40 MG tablet Take 1 tablet (40 mg total) by mouth daily. 30 tablet 0   latanoprost (XALATAN) 0.005 % ophthalmic solution Place 1 drop into both eyes at bedtime.     metFORMIN (GLUCOPHAGE-XR) 500 MG 24 hr tablet Take 500 mg by mouth daily with breakfast.     olmesartan  (BENICAR ) 40 MG tablet TAKE ONE TABLET (40 MG) BY MOUTH ONCE DAILY 30 tablet 3   omeprazole (PRILOSEC) 40 MG capsule Take 40 mg by mouth daily.      oxybutynin  (DITROPAN  XL) 15 MG 24 hr tablet Take 15 mg by mouth at bedtime.      spironolactone  (ALDACTONE ) 25 MG tablet Take 1 tablet (25 mg total) by mouth daily. 30 tablet 0   Vitamin D, Ergocalciferol, (DRISDOL) 1.25 MG (50000 UNIT) CAPS capsule Take 50,000 Units by mouth once a week.     warfarin (COUMADIN ) 3 MG tablet Take 3 mg by mouth daily. Taking 2 mg Mon, Wed, Fri and 3 mg on Sun, Tu, Th, Sat     No current facility-administered medications for this visit.    No Known Allergies    Social History   Socioeconomic History   Marital status: Single    Spouse name: Not on file   Number of children: Not on file   Years of education: Not on file   Highest education level: Not on file  Occupational History   Not on file  Tobacco Use   Smoking status: Never   Smokeless tobacco: Never  Vaping Use   Vaping status: Never Used  Substance and Sexual Activity   Alcohol use: No   Drug use: No   Sexual activity: Not Currently  Other Topics Concern   Not on file  Social History Narrative   Not on file   Social Drivers of Health   Financial Resource Strain: Low Risk  (05/19/2024)   Received from [facility]   Overall Financial Resource Strain (CARDIA)    Difficulty of Paying Living Expenses: Not hard at all  Food Insecurity: No Food Insecurity (07/01/2024)   Hunger Vital Sign    Worried  About Running Out of Food in the Last Year: Never true    Ran Out of Food in the Last Year: Never true  Transportation Needs: No Transportation Needs (07/01/2024)   PRAPARE - Administrator, Civil Service (Medical): No    Lack of Transportation (Non-Medical): No  Physical Activity: Not on file  Stress: Not on file  Social Connections: Unknown (07/01/2024)   Social Connection and Isolation Panel    Frequency of Communication with Friends and Family: More than three times a week    Frequency of Social  Gatherings with Friends and Family: More than three times a week    Attends Religious Services: More than 4 times per year    Active Member of Clubs or Organizations: No    Attends Banker Meetings: Never    Marital Status: Patient declined  Intimate Partner Violence: Not At Risk (07/01/2024)   Humiliation, Afraid, Rape, and Kick questionnaire    Fear of Current or Ex-Partner: No    Emotionally Abused: No    Physically Abused: No    Sexually Abused: No      Family History  Problem Relation Age of Onset   Hypertension Mother    Breast cancer Maternal Aunt        60's   Cancer Maternal Uncle    Stroke Maternal Grandfather     Vitals:   07/14/24 1040  BP: (!) 105/52  Pulse: 64  SpO2: 97%  Weight: 142 lb 12.8 oz (64.8 kg)   Wt Readings from Last 3 Encounters:  07/14/24 142 lb 12.8 oz (64.8 kg)  07/08/24 143 lb 12.8 oz (65.2 kg)  07/06/24 142 lb 8 oz (64.6 kg)   Lab Results  Component Value Date   CREATININE 1.20 (H) 07/06/2024   CREATININE 1.14 (H) 07/05/2024   CREATININE 1.02 (H) 07/04/2024     PHYSICAL EXAM:  General: Well appearing.  Cor: No JVD. Regular rhythm, regular rate.  Lungs: clear Abdomen: soft, nontender, nondistended. Extremities: no edema Neuro:. Affect pleasant   ECG: not done  [employer] quick look: VP 100%, 0 VT   ASSESSMENT & PLAN:  1: HFpEF- - suspect due to apical HCM - NYHA class II - euvolemic - weighing  daily. Understands parameters to call about. Weight at home has been within 2 pounds  - Echo 07/01/24: EF 55-60%, normal RV, mildly elevated PA pressure, mild LAE, mild MR, mild/ moderate TR.  - continue jardiance  10mg  daily. Have reached out to HF pharm Sarah Sweeney) regarding grant eligibility   - continue furosemide  40mg  daily - continue olmesartan  40mg  daily - continue spironolactone  25mg  daily - proBNP 07/01/24 was 4452.0 - BP 105/52  2: Apical HCM- - CMR 04/2019 consistent with apical HCM with patchy LGE, normal biventricular function   3: HTN- - BP 105/52 - saw PCP Sarah Sweeney) 11/25 - BMET 07/09/24 reviewed: sodium 140, potassium 4.2, creatinine 1.1, GFR 50  4: Permanent AF- - DCCV 07/17/19 - s/p MDT single chamber PPM 09/07/21, - saw EP (Sarah Sweeney) 11/25  5: T2DM- - A1c 07/01/24 was 6.7% - managed by PCP  6: PE- - 2013, continues warfarin - INR 07/09/24 was 1.8  7: HLD- - LDL 05/24/22 was 65 - saw cardiology (Sarah Sweeney) 09/25 - continue atorvastatin  20mg  daily  8: OSA- - wearing CPAP nightly   Return in 1 month, sooner if needed.   I spent 52 minutes reviewing records, interviewing/ examing patient and managing plan/ orders.   Sarah Sweeney DELENA Class, FNP 07/13/24

## 2024-07-14 ENCOUNTER — Ambulatory Visit: Attending: Family | Admitting: Family

## 2024-07-14 ENCOUNTER — Other Ambulatory Visit (HOSPITAL_COMMUNITY): Payer: Self-pay

## 2024-07-14 ENCOUNTER — Encounter: Payer: Self-pay | Admitting: Family

## 2024-07-14 VITALS — BP 105/52 | HR 64 | Wt 142.8 lb

## 2024-07-14 DIAGNOSIS — I110 Hypertensive heart disease with heart failure: Secondary | ICD-10-CM | POA: Diagnosis present

## 2024-07-14 DIAGNOSIS — I4821 Permanent atrial fibrillation: Secondary | ICD-10-CM | POA: Insufficient documentation

## 2024-07-14 DIAGNOSIS — Z8249 Family history of ischemic heart disease and other diseases of the circulatory system: Secondary | ICD-10-CM | POA: Diagnosis not present

## 2024-07-14 DIAGNOSIS — E1122 Type 2 diabetes mellitus with diabetic chronic kidney disease: Secondary | ICD-10-CM

## 2024-07-14 DIAGNOSIS — Z7984 Long term (current) use of oral hypoglycemic drugs: Secondary | ICD-10-CM | POA: Insufficient documentation

## 2024-07-14 DIAGNOSIS — I5030 Unspecified diastolic (congestive) heart failure: Secondary | ICD-10-CM

## 2024-07-14 DIAGNOSIS — E119 Type 2 diabetes mellitus without complications: Secondary | ICD-10-CM | POA: Insufficient documentation

## 2024-07-14 DIAGNOSIS — Z86711 Personal history of pulmonary embolism: Secondary | ICD-10-CM | POA: Insufficient documentation

## 2024-07-14 DIAGNOSIS — Z7901 Long term (current) use of anticoagulants: Secondary | ICD-10-CM | POA: Insufficient documentation

## 2024-07-14 DIAGNOSIS — Z950 Presence of cardiac pacemaker: Secondary | ICD-10-CM | POA: Diagnosis not present

## 2024-07-14 DIAGNOSIS — E785 Hyperlipidemia, unspecified: Secondary | ICD-10-CM | POA: Insufficient documentation

## 2024-07-14 DIAGNOSIS — I10 Essential (primary) hypertension: Secondary | ICD-10-CM

## 2024-07-14 DIAGNOSIS — G4733 Obstructive sleep apnea (adult) (pediatric): Secondary | ICD-10-CM | POA: Insufficient documentation

## 2024-07-14 DIAGNOSIS — N1831 Chronic kidney disease, stage 3a: Secondary | ICD-10-CM

## 2024-07-14 DIAGNOSIS — I5032 Chronic diastolic (congestive) heart failure: Secondary | ICD-10-CM | POA: Diagnosis not present

## 2024-07-14 DIAGNOSIS — E1169 Type 2 diabetes mellitus with other specified complication: Secondary | ICD-10-CM

## 2024-07-14 DIAGNOSIS — Z79899 Other long term (current) drug therapy: Secondary | ICD-10-CM | POA: Diagnosis not present

## 2024-07-14 DIAGNOSIS — I2699 Other pulmonary embolism without acute cor pulmonale: Secondary | ICD-10-CM

## 2024-07-14 DIAGNOSIS — I422 Other hypertrophic cardiomyopathy: Secondary | ICD-10-CM

## 2024-07-14 MED ORDER — SPIRONOLACTONE 25 MG PO TABS: 25.0000 mg | ORAL_TABLET | Freq: Every day | ORAL | 1 refills | Status: AC

## 2024-07-14 MED ORDER — FUROSEMIDE 40 MG PO TABS: 40.0000 mg | ORAL_TABLET | Freq: Every day | ORAL | 1 refills | Status: DC

## 2024-07-14 MED ORDER — EMPAGLIFLOZIN 10 MG PO TABS: 10.0000 mg | ORAL_TABLET | Freq: Every day | ORAL | Status: AC

## 2024-07-14 NOTE — Patient Instructions (Addendum)
 It was a pleasure meeting you today!  Medication Changes:  No medication changes today!   Follow-Up in: Please follow up with the [facility] in 1 month with Ellouise Class, FNP.   Thank you for choosing [facility].    At the Advanced [facility] Failure Clinic, you and your health needs are our priority. We have a designated team specialized in the treatment of Heart Failure. This Care Team includes your primary Heart Failure Specialized Cardiologist (physician), Advanced Practice Providers (APPs- Physician Assistants and Nurse Practitioners), and Pharmacist who all work together to provide you with the care you need, when you need it.   You may see any of the following providers on your designated Care Team at your next follow up:  Dr. Toribio Fuel Dr. Ezra Shuck Dr. Ria Commander Dr. Morene Brownie Ellouise Class, FNP Jaun Bash, RPH-CPP  Please be sure to bring in all your medications bottles to every appointment.   Need to Contact Us :  If you have any questions or concerns before your next appointment please send us  a message through [place] or call our office at [phone].    TO LEAVE A MESSAGE FOR THE NURSE SELECT OPTION 2, PLEASE LEAVE A MESSAGE INCLUDING: YOUR NAME DATE OF BIRTH CALL BACK NUMBER REASON FOR CALL**this is important as we prioritize the call backs  YOU WILL RECEIVE A CALL BACK THE SAME DAY AS LONG AS YOU CALL BEFORE 4:00 PM

## 2024-07-15 ENCOUNTER — Telehealth: Payer: Self-pay

## 2024-07-15 ENCOUNTER — Other Ambulatory Visit (HOSPITAL_COMMUNITY): Payer: Self-pay

## 2024-07-15 ENCOUNTER — Encounter: Payer: Self-pay | Admitting: Internal Medicine

## 2024-07-15 NOTE — Telephone Encounter (Signed)
 Advanced Heart Failure Patient Advocate Encounter  The patient was approved for a [employer] grant that will help cover the cost of Jardiance , Spironolactone .  Total amount awarded, $7,500.  Effective: 06/15/2024 - 06/14/2025.  BIN [id number] PCN [id number] [id number] ID [id number]  Diagnosis verification required before grant becomes active. Document uploaded on 07/15/2024.  Rachel DEL, CPhT Rx Patient Advocate Phone: [phone]

## 2024-07-21 ENCOUNTER — Encounter: Payer: Self-pay | Admitting: Internal Medicine

## 2024-07-21 ENCOUNTER — Other Ambulatory Visit (HOSPITAL_COMMUNITY): Payer: Self-pay

## 2024-07-21 NOTE — Telephone Encounter (Addendum)
 Diagnosis verification has been reviewed, grant is active. Informed patient by phone

## 2024-08-04 ENCOUNTER — Other Ambulatory Visit: Payer: Self-pay | Admitting: Internal Medicine

## 2024-08-15 ENCOUNTER — Ambulatory Visit: Admitting: Family

## 2024-08-18 ENCOUNTER — Telehealth: Payer: Self-pay | Admitting: Family

## 2024-08-18 NOTE — Telephone Encounter (Signed)
 Called to confirm/remind patient of their appointment at the [facility] Clinic on 08/19/24.   Appointment:   [x] Confirmed  [] Left mess   [] No answer/No voice mail  [] VM Full/unable to leave message  [] Phone not in service  Patient reminded to bring all medications and/or complete list.  Confirmed patient has transportation. Gave directions, instructed to utilize valet parking.

## 2024-08-18 NOTE — Progress Notes (Signed)
 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   Admitted 07/01/24 with progressive shortness of breath, orthopnea, and ankle swelling. On admission, proBNP was 4452, HS-troponin was 29, and A1c 6.7. Chest x-ray noted to be stable. CTA noted no acute PE, moderate pulmonary edema, bilateral pleural effusions. Echo 07/01/24: EF 55-60%, normal RV, mildly elevated PA pressure, mild LAE, mild MR, mild/ moderate TR. Cardiology consulted. IV diuresed. Developed fever and found to have acute cystitis. Symptoms improved with antibiotics. Initially needed BIPAP but able to be weaned off to room air.   She presents today for a TOC f/u visit with a chief complaint of moderate fatigue. Has associated frequent dizziness with position changes, shortness of breath. Sleeping ok most nights. Denies chest pain, palpitations, edema or weight gain. Biggest complaint is of her fatigue as well as frequent dizziness. She says that, at times, she will feel so dizzy that she sees spots. Denies any falls. Says that that when she experiences dizziness, it tends to resolve pretty quickly. Does have a BP cuff at home and sporadically checks it.   Pertinent cardiac history: Diagnosed with AF 02/2019. TTE 03/2019 noted LVEF 50-55%, severe asymmetric LVH of the apical wall consistent with apical hypertrophic CM, low-normal RV function, and mild to moderate MR. CMRI 04/2019 noted apical HCM, LGE of inferior RV insertion site. Underwent DCCV 07/2019. Presented in 05/2021 in [place] with slow ventricular response and eventually underwent PPM implant in 08/2021. TTE 02/2022 with LVEF of 55-60%.  ROS: All systems negative  except what is listed in HPI, PMH and Problem List  Past Medical History:  Diagnosis Date   Allergy    Apical variant hypertrophic cardiomyopathy (HCC)    Atrial fibrillation and flutter (HCC)    Diabetes mellitus without complication (HCC)    Erythrocytosis 03/02/2015   GERD (gastroesophageal reflux disease)    Headache    Hypertension    Pulmonary embolism (HCC) 2013   Coumadin  Therapy   Sleep apnea    CPAP   Symptomatic bradycardia    s/p single-chamber pacemaker (08/2021 - Dr. Cindie)    Current Outpatient Medications  Medication Sig Dispense Refill   acetaminophen  (TYLENOL ) 325 MG tablet Take 1-2 tablets (325-650 mg total) by mouth every 4 (four) hours as needed for mild pain.     alendronate (FOSAMAX) 70 MG tablet Take 70 mg by mouth once a week.     atorvastatin  (LIPITOR) 20 MG tablet TAKE 1 TABLET BY MOUTH DAILY 90 tablet 2   Cyanocobalamin (B-12 PO) Take by mouth daily.     DENTA 5000 PLUS 1.1 % CREA dental cream Take 1 Application by mouth daily.     diclofenac  Sodium (VOLTAREN ) 1 % GEL Apply 1 application topically 2 (two) times daily as needed (pain).     empagliflozin  (JARDIANCE ) 10 MG TABS tablet Take 1 tablet (10 mg total) by mouth daily before breakfast.     furosemide  (LASIX ) 40 MG tablet Take 1 tablet (40 mg total) by mouth daily. 90 tablet 1   latanoprost (XALATAN) 0.005 % ophthalmic solution Place 1 drop into both eyes at bedtime.     metFORMIN (GLUCOPHAGE-XR) 500 MG 24 hr tablet Take  500 mg by mouth daily with breakfast.     olmesartan  (BENICAR ) 40 MG tablet TAKE ONE TABLET (40 MG) BY MOUTH ONCE DAILY 30 tablet 3   omeprazole (PRILOSEC) 40 MG capsule Take 40 mg by mouth daily.      oxybutynin  (DITROPAN  XL) 15 MG 24 hr tablet Take 15 mg by mouth at bedtime.      spironolactone  (ALDACTONE ) 25 MG tablet Take 1 tablet (25 mg total) by mouth daily. 90 tablet 1   Vitamin D, Ergocalciferol, (DRISDOL) 1.25 MG (50000 UNIT) CAPS capsule Take 50,000 Units by mouth once a  week.     warfarin (COUMADIN ) 3 MG tablet Take 3 mg by mouth daily. Taking 2 mg Mon, Wed, Fri and 3 mg on Sun, Tu, Th, Sat     No current facility-administered medications for this visit.    No Known Allergies    Social History   Socioeconomic History   Marital status: Single    Spouse name: Not on file   Number of children: Not on file   Years of education: Not on file   Highest education level: Not on file  Occupational History   Not on file  Tobacco Use   Smoking status: Never   Smokeless tobacco: Never  Vaping Use   Vaping status: Never Used  Substance and Sexual Activity   Alcohol use: No   Drug use: No   Sexual activity: Not Currently  Other Topics Concern   Not on file  Social History Narrative   Not on file   Social Drivers of Health   Tobacco Use: Low Risk (07/14/2024)   Patient History    Smoking Tobacco Use: Never    Smokeless Tobacco Use: Never    Passive Exposure: Not on file  Financial Resource Strain: Low Risk  (05/19/2024)   Received from [facility]   Overall Financial Resource Strain (CARDIA)    Difficulty of Paying Living Expenses: Not hard at all  Food Insecurity: No Food Insecurity (07/01/2024)   Epic    Worried About Radiation Protection Practitioner of Food in the Last Year: Never true    Ran Out of Food in the Last Year: Never true  Transportation Needs: No Transportation Needs (07/01/2024)   Epic    Lack of Transportation (Medical): No    Lack of Transportation (Non-Medical): No  Physical Activity: Not on file  Stress: Not on file  Social Connections: Unknown (07/01/2024)   Social Connection and Isolation Panel    Frequency of Communication with Friends and Family: More than three times a week    Frequency of Social Gatherings with Friends and Family: More than three times a week    Attends Religious Services: More than 4 times per year    Active Member of [employer] or Organizations: No    Attends Banker Meetings: Never     Marital Status: Patient declined  Catering Manager Violence: Not At Risk (07/01/2024)   Epic    Fear of Current or Ex-Partner: No    Emotionally Abused: No    Physically Abused: No    Sexually Abused: No  Depression (PHQ2-9): Not on file  Alcohol Screen: Not on file  Housing: Low Risk (07/01/2024)   Epic    Unable to Pay for Housing in the Last Year: No    Number of Times Moved in the Last Year: 0    Homeless in the Last Year: No  Utilities: Not At Risk (07/01/2024)   Epic  Threatened with loss of utilities: No  Health Literacy: Not on file      Family History  Problem Relation Age of Onset   Hypertension Mother    Breast cancer Maternal Aunt        63's   Cancer Maternal Uncle    Stroke Maternal Grandfather    Vitals:   08/19/24 1312  BP: (!) 94/48  Pulse: 74  Weight: 142 lb (64.4 kg)   Wt Readings from Last 3 Encounters:  08/19/24 142 lb (64.4 kg)  07/14/24 142 lb 12.8 oz (64.8 kg)  07/08/24 143 lb 12.8 oz (65.2 kg)   Lab Results  Component Value Date   CREATININE 1.20 (H) 07/06/2024   CREATININE 1.14 (H) 07/05/2024   CREATININE 1.02 (H) 07/04/2024   PHYSICAL EXAM:  General: Well appearing.  Cor: No JVD. Regular rhythm, rate.  Lungs: clear Abdomen: soft, nontender, nondistended. Extremities: no edema Neuro:. Affect pleasant  ECG: not done   Medtronic quick look today: VP 99.9%, 0 VT   ASSESSMENT & PLAN:  1: HFpEF- - suspect due to apical HCM - NYHA class II - euvolemic - weight stable from last visit here 1 month ago - Echo 07/01/24: EF 55-60%, normal RV, mildly elevated PA pressure, mild LAE, mild MR, mild/ moderate TR.  - continue jardiance  10mg  daily. Was approved for grant.   - continue furosemide  40mg  daily - stop olmesartan  due to low BP with symptomatic dizziness - continue spironolactone  25mg  daily - proBNP 07/01/24 was 4452.0  2: Apical HCM- - CMR 04/2019 consistent with apical HCM with patchy LGE, normal biventricular function    3: HTN- - BP 94/48. Stop Olmesartan .   - BP log provided to patient and instructed her to check it daily alternating times of day it's checked. If BP doesn't improve with stoppage of olmesartan , she is to call us  before her next appointment.  - saw PCP Dois) 11/25 - BMET 07/09/24 reviewed: sodium 140, potassium 4.2, creatinine 1.1, GFR 50  4: Permanent AF- - DCCV 07/17/19 - s/p MDT single chamber PPM 09/07/21, - saw EP (Riddle) 11/25  5: T2DM- - A1c 07/01/24 was 6.7% - managed by PCP  6: PE- - 2013, continues warfarin - INR 08/15/24 was 2.7  7: HLD- - LDL 05/12/24 was 43 - saw cardiology (End) 09/25 - continue atorvastatin  20mg  daily  8: OSA- - wearing CPAP nightly   Return in 3 weeks, sooner if needed.   I spent 35 minutes reviewing records, interviewing/ examing patient and managing plan/ orders.   Ellouise DELENA Class, FNP 08/18/2024  "

## 2024-08-19 ENCOUNTER — Ambulatory Visit: Attending: Family | Admitting: Family

## 2024-08-19 ENCOUNTER — Encounter: Payer: Self-pay | Admitting: Family

## 2024-08-19 VITALS — BP 94/48 | HR 74 | Wt 142.0 lb

## 2024-08-19 DIAGNOSIS — Z79899 Other long term (current) drug therapy: Secondary | ICD-10-CM | POA: Insufficient documentation

## 2024-08-19 DIAGNOSIS — Z950 Presence of cardiac pacemaker: Secondary | ICD-10-CM | POA: Diagnosis not present

## 2024-08-19 DIAGNOSIS — E119 Type 2 diabetes mellitus without complications: Secondary | ICD-10-CM | POA: Insufficient documentation

## 2024-08-19 DIAGNOSIS — E1122 Type 2 diabetes mellitus with diabetic chronic kidney disease: Secondary | ICD-10-CM

## 2024-08-19 DIAGNOSIS — I4821 Permanent atrial fibrillation: Secondary | ICD-10-CM | POA: Diagnosis not present

## 2024-08-19 DIAGNOSIS — Z7984 Long term (current) use of oral hypoglycemic drugs: Secondary | ICD-10-CM | POA: Insufficient documentation

## 2024-08-19 DIAGNOSIS — I2699 Other pulmonary embolism without acute cor pulmonale: Secondary | ICD-10-CM

## 2024-08-19 DIAGNOSIS — E785 Hyperlipidemia, unspecified: Secondary | ICD-10-CM | POA: Insufficient documentation

## 2024-08-19 DIAGNOSIS — I10 Essential (primary) hypertension: Secondary | ICD-10-CM

## 2024-08-19 DIAGNOSIS — I110 Hypertensive heart disease with heart failure: Secondary | ICD-10-CM | POA: Insufficient documentation

## 2024-08-19 DIAGNOSIS — Z86711 Personal history of pulmonary embolism: Secondary | ICD-10-CM | POA: Insufficient documentation

## 2024-08-19 DIAGNOSIS — I5032 Chronic diastolic (congestive) heart failure: Secondary | ICD-10-CM | POA: Diagnosis not present

## 2024-08-19 DIAGNOSIS — E1169 Type 2 diabetes mellitus with other specified complication: Secondary | ICD-10-CM

## 2024-08-19 DIAGNOSIS — I422 Other hypertrophic cardiomyopathy: Secondary | ICD-10-CM | POA: Diagnosis not present

## 2024-08-19 DIAGNOSIS — N1831 Chronic kidney disease, stage 3a: Secondary | ICD-10-CM

## 2024-08-19 DIAGNOSIS — G4733 Obstructive sleep apnea (adult) (pediatric): Secondary | ICD-10-CM | POA: Insufficient documentation

## 2024-08-19 NOTE — Patient Instructions (Signed)
 Medication Changes:  STOP Olmesartan    Follow-Up in: Please follow up with the Advanced Heart Failure Clinic in 3 weeks with Ellouise Class, FNP.   Thank you for choosing [facility].    At the Advanced Heart Failure Clinic, you and your health needs are our priority. We have a designated team specialized in the treatment of Heart Failure. This Care Team includes your primary Heart Failure Specialized Cardiologist (physician), Advanced Practice Providers (APPs- Physician Assistants and Nurse Practitioners), and Pharmacist who all work together to provide you with the care you need, when you need it.   You may see any of the following providers on your designated Care Team at your next follow up:  Dr. Toribio Fuel Dr. Ezra Shuck Dr. Ria Commander Dr. Morene Brownie Ellouise Class, FNP Jaun Bash, RPH-CPP  Please be sure to bring in all your medications bottles to every appointment.   Need to Contact Us :  If you have any questions or concerns before your next appointment please send us  a message through [place] or call our office at [phone].    TO LEAVE A MESSAGE FOR THE NURSE SELECT OPTION 2, PLEASE LEAVE A MESSAGE INCLUDING: YOUR NAME DATE OF BIRTH CALL BACK NUMBER REASON FOR CALL**this is important as we prioritize the call backs  YOU WILL RECEIVE A CALL BACK THE SAME DAY AS LONG AS YOU CALL BEFORE 4:00 PM

## 2024-09-05 ENCOUNTER — Telehealth: Payer: Self-pay | Admitting: Internal Medicine

## 2024-09-05 ENCOUNTER — Ambulatory Visit

## 2024-09-05 DIAGNOSIS — I5032 Chronic diastolic (congestive) heart failure: Secondary | ICD-10-CM

## 2024-09-05 LAB — CUP PACEART REMOTE DEVICE CHECK
Battery Remaining Longevity: 121 mo
Battery Voltage: 3 V
Brady Statistic RV Percent Paced: 99.89 %
Date Time Interrogation Session: 20260123084051
Implantable Lead Connection Status: 753985
Implantable Lead Implant Date: 20230125
Implantable Lead Location: 753860
Implantable Lead Model: 3830
Implantable Pulse Generator Implant Date: 20230125
Lead Channel Impedance Value: 418 Ohm
Lead Channel Impedance Value: 570 Ohm
Lead Channel Pacing Threshold Amplitude: 0.5 V
Lead Channel Pacing Threshold Pulse Width: 0.4 ms
Lead Channel Sensing Intrinsic Amplitude: 23.375 mV
Lead Channel Sensing Intrinsic Amplitude: 23.375 mV
Lead Channel Setting Pacing Amplitude: 2 V
Lead Channel Setting Pacing Pulse Width: 0.4 ms
Lead Channel Setting Sensing Sensitivity: 1.2 mV
Zone Setting Status: 755011

## 2024-09-05 NOTE — Telephone Encounter (Signed)
 Called patient back   Patient stated they received an email about their scheduled remote check and was unsure what to do  This RN stated that the email was to remind them that the device clinic had scheduled their check for today and that they could call the clinic if they wanted to check and see that we received it   This RN confirmed with patient that we did indeed receive her scheduled remote transmission  Patient was appreciative of call back, confirmation of remote receipt, and all other information provided by this RN

## 2024-09-05 NOTE — Telephone Encounter (Signed)
 Patient is calling stating she had a Remote Pacer check today 09/05/24 at 7:00 am and received a message for patient to call. Reached out to the device clinic and could not get a answer.  Patient is requesting a call back.

## 2024-09-08 ENCOUNTER — Ambulatory Visit: Payer: Self-pay | Admitting: Cardiology

## 2024-09-08 NOTE — Progress Notes (Unsigned)
 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   Admitted 07/01/24 with progressive shortness of breath, orthopnea, and ankle swelling. On admission, proBNP was 4452, HS-troponin was 29, and A1c 6.7. Chest x-ray noted to be stable. CTA noted no acute PE, moderate pulmonary edema, bilateral pleural effusions. Echo 07/01/24: EF 55-60%, normal RV, mildly elevated PA pressure, mild LAE, mild MR, mild/ moderate TR. Cardiology consulted. IV diuresed. Developed fever and found to have acute cystitis. Symptoms improved with antibiotics. Initially needed BIPAP but able to be weaned off to room air.   She presents today for a TOC f/u visit with a chief complaint of moderate fatigue. Has associated frequent dizziness with position changes, shortness of breath. Sleeping ok most nights. Denies chest pain, palpitations, edema or weight gain. Biggest complaint is of her fatigue as well as frequent dizziness. She says that, at times, she will feel so dizzy that she sees spots. Denies any falls. Says that that when she experiences dizziness, it tends to resolve pretty quickly. Does have a BP cuff at home and sporadically checks it.   Pertinent cardiac history: Diagnosed with AF 02/2019. TTE 03/2019 noted LVEF 50-55%, severe asymmetric LVH of the apical wall consistent with apical hypertrophic CM, low-normal RV function, and mild to moderate MR. CMRI 04/2019 noted apical HCM, LGE of inferior RV insertion site. Underwent DCCV 07/2019. Presented in 05/2021 in [place] with slow ventricular response and eventually underwent PPM implant in 08/2021. TTE 02/2022 with LVEF of 55-60%.  ROS: All systems negative  except what is listed in HPI, PMH and Problem List  Past Medical History:  Diagnosis Date   Allergy    Apical variant hypertrophic cardiomyopathy (HCC)    Atrial fibrillation and flutter (HCC)    Diabetes mellitus without complication (HCC)    Erythrocytosis 03/02/2015   GERD (gastroesophageal reflux disease)    Headache    Hypertension    Pulmonary embolism (HCC) 2013   Coumadin  Therapy   Sleep apnea    CPAP   Symptomatic bradycardia    s/p single-chamber pacemaker (08/2021 - Dr. Cindie)    Current Outpatient Medications  Medication Sig Dispense Refill   acetaminophen  (TYLENOL ) 325 MG tablet Take 1-2 tablets (325-650 mg total) by mouth every 4 (four) hours as needed for mild pain.     alendronate (FOSAMAX) 70 MG tablet Take 70 mg by mouth once a week.     atorvastatin  (LIPITOR) 20 MG tablet TAKE 1 TABLET BY MOUTH DAILY 90 tablet 2   Cyanocobalamin (B-12 PO) Take by mouth daily.     DENTA 5000 PLUS 1.1 % CREA dental cream Take 1 Application by mouth daily.     diclofenac  Sodium (VOLTAREN ) 1 % GEL Apply 1 application topically 2 (two) times daily as needed (pain).     empagliflozin  (JARDIANCE ) 10 MG TABS tablet Take 1 tablet (10 mg total) by mouth daily before breakfast.     furosemide  (LASIX ) 40 MG tablet Take 1 tablet (40 mg total) by mouth daily. 90 tablet 1   latanoprost (XALATAN) 0.005 % ophthalmic solution Place 1 drop into both eyes at bedtime.     metFORMIN (GLUCOPHAGE-XR) 500 MG 24 hr tablet Take  500 mg by mouth daily with breakfast.     omeprazole (PRILOSEC) 40 MG capsule Take 40 mg by mouth daily.      oxybutynin  (DITROPAN  XL) 15 MG 24 hr tablet Take 15 mg by mouth at bedtime.      spironolactone  (ALDACTONE ) 25 MG tablet Take 1 tablet (25 mg total) by mouth daily. 90 tablet 1   Vitamin D, Ergocalciferol, (DRISDOL) 1.25 MG (50000 UNIT) CAPS capsule Take 50,000 Units by mouth once a week.     warfarin (COUMADIN ) 3 MG tablet Take 3 mg by mouth daily. Taking 2 mg Mon, Wed,  Fri and 3 mg on Sun, Tu, Th, Sat     No current facility-administered medications for this visit.    No Known Allergies    Social History   Socioeconomic History   Marital status: Single    Spouse name: Not on file   Number of children: Not on file   Years of education: Not on file   Highest education level: Not on file  Occupational History   Not on file  Tobacco Use   Smoking status: Never   Smokeless tobacco: Never  Vaping Use   Vaping status: Never Used  Substance and Sexual Activity   Alcohol use: No   Drug use: No   Sexual activity: Not Currently  Other Topics Concern   Not on file  Social History Narrative   Not on file   Social Drivers of Health   Tobacco Use: Low Risk (08/19/2024)   Patient History    Smoking Tobacco Use: Never    Smokeless Tobacco Use: Never    Passive Exposure: Not on file  Financial Resource Strain: Low Risk  (05/19/2024)   Received from [facility]   Overall Financial Resource Strain (CARDIA)    Difficulty of Paying Living Expenses: Not hard at all  Food Insecurity: No Food Insecurity (07/01/2024)   Epic    Worried About Radiation Protection Practitioner of Food in the Last Year: Never true    Ran Out of Food in the Last Year: Never true  Transportation Needs: No Transportation Needs (07/01/2024)   Epic    Lack of Transportation (Medical): No    Lack of Transportation (Non-Medical): No  Physical Activity: Not on file  Stress: Not on file  Social Connections: Unknown (07/01/2024)   Social Connection and Isolation Panel    Frequency of Communication with Friends and Family: More than three times a week    Frequency of Social Gatherings with Friends and Family: More than three times a week    Attends Religious Services: More than 4 times per year    Active Member of [employer] or Organizations: No    Attends Banker Meetings: Never    Marital Status: Patient declined  Catering Manager Violence: Not At Risk (07/01/2024)   Epic     Fear of Current or Ex-Partner: No    Emotionally Abused: No    Physically Abused: No    Sexually Abused: No  Depression (PHQ2-9): Not on file  Alcohol Screen: Not on file  Housing: Low Risk (07/01/2024)   Epic    Unable to Pay for Housing in the Last Year: No    Number of Times Moved in the Last Year: 0    Homeless in the Last Year: No  Utilities: Not At Risk (07/01/2024)   Epic    Threatened with loss of utilities: No  Health Literacy: Not on file  Family History  Problem Relation Age of Onset   Hypertension Mother    Breast cancer Maternal Aunt        22's   Cancer Maternal Uncle    Stroke Maternal Grandfather    There were no vitals filed for this visit.  Wt Readings from Last 3 Encounters:  08/19/24 142 lb (64.4 kg)  07/14/24 142 lb 12.8 oz (64.8 kg)  07/08/24 143 lb 12.8 oz (65.2 kg)   Lab Results  Component Value Date   CREATININE 1.20 (H) 07/06/2024   CREATININE 1.14 (H) 07/05/2024   CREATININE 1.02 (H) 07/04/2024   PHYSICAL EXAM:  General: Well appearing.  Cor: No JVD. Regular rhythm, rate.  Lungs: clear Abdomen: soft, nontender, nondistended. Extremities: no edema Neuro:. Affect pleasant  ECG: not done   Medtronic quick look today: VP 99.9%, 0 VT   ASSESSMENT & PLAN:  1: HFpEF- - suspect due to apical HCM - NYHA Sweeney II - euvolemic - weight stable from last visit here 1 month ago - Echo 07/01/24: EF 55-60%, normal RV, mildly elevated PA pressure, mild LAE, mild MR, mild/ moderate TR.  - continue jardiance  10mg  daily. Was approved for grant.   - continue furosemide  40mg  daily - stop olmesartan  due to low BP with symptomatic dizziness - continue spironolactone  25mg  daily - proBNP 07/01/24 was 4452.0  2: Apical HCM- - CMR 04/2019 consistent with apical HCM with patchy LGE, normal biventricular function   3: HTN- - BP 94/48. Stop Olmesartan .   - BP log provided to patient and instructed her to check it daily alternating times of  day it's checked. If BP doesn't improve with stoppage of olmesartan , she is to call us  before her next appointment.  - saw PCP Dois) 11/25 - BMET 07/09/24 reviewed: sodium 140, potassium 4.2, creatinine 1.1, GFR 50  4: Permanent AF- - DCCV 07/17/19 - s/p MDT single chamber PPM 09/07/21, - saw EP (Riddle) 11/25  5: T2DM- - A1c 07/01/24 was 6.7% - managed by PCP  6: PE- - 2013, continues warfarin - INR 08/15/24 was 2.7  7: HLD- - LDL 05/12/24 was 43 - saw cardiology (End) 09/25 - continue atorvastatin  20mg  daily  8: OSA- - wearing CPAP nightly   Return in 3 weeks, sooner if needed.   I spent 35 minutes reviewing records, interviewing/ examing patient and managing plan/ orders.   Sarah DELENA Class, FNP 09/08/24  "

## 2024-09-08 NOTE — Progress Notes (Signed)
 Remote PPM Transmission

## 2024-09-09 ENCOUNTER — Encounter: Payer: Self-pay | Admitting: Family

## 2024-09-09 ENCOUNTER — Ambulatory Visit: Attending: Family | Admitting: Family

## 2024-09-09 VITALS — BP 117/63 | HR 71 | Wt 137.2 lb

## 2024-09-09 DIAGNOSIS — E785 Hyperlipidemia, unspecified: Secondary | ICD-10-CM | POA: Insufficient documentation

## 2024-09-09 DIAGNOSIS — I10 Essential (primary) hypertension: Secondary | ICD-10-CM | POA: Diagnosis not present

## 2024-09-09 DIAGNOSIS — Z86711 Personal history of pulmonary embolism: Secondary | ICD-10-CM | POA: Insufficient documentation

## 2024-09-09 DIAGNOSIS — E1122 Type 2 diabetes mellitus with diabetic chronic kidney disease: Secondary | ICD-10-CM | POA: Diagnosis not present

## 2024-09-09 DIAGNOSIS — I110 Hypertensive heart disease with heart failure: Secondary | ICD-10-CM | POA: Diagnosis not present

## 2024-09-09 DIAGNOSIS — I5032 Chronic diastolic (congestive) heart failure: Secondary | ICD-10-CM | POA: Insufficient documentation

## 2024-09-09 DIAGNOSIS — Z7984 Long term (current) use of oral hypoglycemic drugs: Secondary | ICD-10-CM | POA: Insufficient documentation

## 2024-09-09 DIAGNOSIS — E1169 Type 2 diabetes mellitus with other specified complication: Secondary | ICD-10-CM

## 2024-09-09 DIAGNOSIS — Z79899 Other long term (current) drug therapy: Secondary | ICD-10-CM | POA: Insufficient documentation

## 2024-09-09 DIAGNOSIS — Z7901 Long term (current) use of anticoagulants: Secondary | ICD-10-CM | POA: Insufficient documentation

## 2024-09-09 DIAGNOSIS — I422 Other hypertrophic cardiomyopathy: Secondary | ICD-10-CM | POA: Insufficient documentation

## 2024-09-09 DIAGNOSIS — G4733 Obstructive sleep apnea (adult) (pediatric): Secondary | ICD-10-CM | POA: Insufficient documentation

## 2024-09-09 DIAGNOSIS — I4821 Permanent atrial fibrillation: Secondary | ICD-10-CM | POA: Insufficient documentation

## 2024-09-09 DIAGNOSIS — Z950 Presence of cardiac pacemaker: Secondary | ICD-10-CM | POA: Insufficient documentation

## 2024-09-09 DIAGNOSIS — E119 Type 2 diabetes mellitus without complications: Secondary | ICD-10-CM | POA: Insufficient documentation

## 2024-09-09 DIAGNOSIS — I2699 Other pulmonary embolism without acute cor pulmonale: Secondary | ICD-10-CM

## 2024-09-09 DIAGNOSIS — R5383 Other fatigue: Secondary | ICD-10-CM | POA: Diagnosis present

## 2024-09-09 DIAGNOSIS — N1831 Chronic kidney disease, stage 3a: Secondary | ICD-10-CM | POA: Diagnosis not present

## 2024-09-13 ENCOUNTER — Other Ambulatory Visit: Payer: Self-pay | Admitting: Family

## 2024-10-22 ENCOUNTER — Ambulatory Visit: Admitting: Internal Medicine

## 2024-11-17 ENCOUNTER — Ambulatory Visit: Admitting: Internal Medicine

## 2024-12-05 ENCOUNTER — Ambulatory Visit

## 2025-03-06 ENCOUNTER — Ambulatory Visit

## 2025-06-05 ENCOUNTER — Ambulatory Visit

## 2025-09-04 ENCOUNTER — Ambulatory Visit
